# Patient Record
Sex: Female | Born: 1950 | Race: Black or African American | Hispanic: No | Marital: Single | Smoking: Never smoker
Health system: Southern US, Community
[De-identification: ages and names within clinical notes are randomized; demographics above are authoritative.]

## PROBLEM LIST (undated history)

## (undated) DIAGNOSIS — E785 Hyperlipidemia, unspecified: Secondary | ICD-10-CM

## (undated) DIAGNOSIS — A630 Anogenital (venereal) warts: Secondary | ICD-10-CM

## (undated) DIAGNOSIS — I10 Essential (primary) hypertension: Secondary | ICD-10-CM

## (undated) HISTORY — PX: OTHER SURGICAL HISTORY: SHX169

## (undated) HISTORY — DX: Essential (primary) hypertension: I10

## (undated) HISTORY — DX: Hyperlipidemia, unspecified: E78.5

## (undated) HISTORY — DX: Anogenital (venereal) warts: A63.0

---

## 2007-03-17 ENCOUNTER — Emergency Department (HOSPITAL_COMMUNITY): Admission: EM | Admit: 2007-03-17 | Discharge: 2007-03-17 | Payer: Self-pay | Admitting: Emergency Medicine

## 2007-08-02 ENCOUNTER — Ambulatory Visit: Payer: Self-pay | Admitting: Internal Medicine

## 2007-08-02 ENCOUNTER — Encounter (INDEPENDENT_AMBULATORY_CARE_PROVIDER_SITE_OTHER): Payer: Self-pay | Admitting: Nurse Practitioner

## 2007-08-02 LAB — CONVERTED CEMR LAB
AST: 13 units/L (ref 0–37)
Albumin: 4.5 g/dL (ref 3.5–5.2)
Alkaline Phosphatase: 93 units/L (ref 39–117)
BUN: 12 mg/dL (ref 6–23)
Basophils Absolute: 0 10*3/uL (ref 0.0–0.1)
Basophils Relative: 0 % (ref 0–1)
Eosinophils Absolute: 0.1 10*3/uL (ref 0.0–0.7)
MCHC: 34.1 g/dL (ref 30.0–36.0)
MCV: 95.5 fL (ref 78.0–100.0)
Monocytes Relative: 6 % (ref 3–11)
Neutrophils Relative %: 46 % (ref 43–77)
Platelets: 209 10*3/uL (ref 150–400)
Potassium: 4 meq/L (ref 3.5–5.3)
RDW: 13.5 % (ref 11.5–14.0)
Sodium: 142 meq/L (ref 135–145)

## 2007-08-28 ENCOUNTER — Encounter (INDEPENDENT_AMBULATORY_CARE_PROVIDER_SITE_OTHER): Payer: Self-pay | Admitting: Nurse Practitioner

## 2007-08-28 ENCOUNTER — Ambulatory Visit: Payer: Self-pay | Admitting: Family Medicine

## 2007-08-28 LAB — CONVERTED CEMR LAB
Cholesterol: 231 mg/dL — ABNORMAL HIGH (ref 0–200)
Total CHOL/HDL Ratio: 3.6
VLDL: 15 mg/dL (ref 0–40)

## 2008-11-11 ENCOUNTER — Emergency Department (HOSPITAL_COMMUNITY): Admission: EM | Admit: 2008-11-11 | Discharge: 2008-11-11 | Payer: Self-pay | Admitting: Emergency Medicine

## 2008-11-27 ENCOUNTER — Emergency Department (HOSPITAL_COMMUNITY): Admission: EM | Admit: 2008-11-27 | Discharge: 2008-11-27 | Payer: Self-pay | Admitting: Emergency Medicine

## 2008-12-09 ENCOUNTER — Ambulatory Visit (HOSPITAL_BASED_OUTPATIENT_CLINIC_OR_DEPARTMENT_OTHER): Admission: RE | Admit: 2008-12-09 | Discharge: 2008-12-09 | Payer: Self-pay | Admitting: Urology

## 2008-12-16 ENCOUNTER — Ambulatory Visit: Payer: Self-pay | Admitting: Internal Medicine

## 2008-12-16 ENCOUNTER — Encounter (INDEPENDENT_AMBULATORY_CARE_PROVIDER_SITE_OTHER): Payer: Self-pay | Admitting: Family Medicine

## 2008-12-16 LAB — CONVERTED CEMR LAB
Band Neutrophils: 0 % (ref 0–10)
Basophils Absolute: 0 10*3/uL (ref 0.0–0.1)
Basophils Relative: 1 % (ref 0–1)
Eosinophils Absolute: 0 10*3/uL (ref 0.0–0.7)
MCHC: 33.3 g/dL (ref 30.0–36.0)
Neutrophils Relative %: 44 % (ref 43–77)
RBC: 4.26 M/uL (ref 3.87–5.11)
RDW: 13.6 % (ref 11.5–15.5)

## 2008-12-30 ENCOUNTER — Ambulatory Visit: Payer: Self-pay | Admitting: Internal Medicine

## 2009-01-01 LAB — LACTATE DEHYDROGENASE: LDH: 139 U/L (ref 94–250)

## 2009-01-01 LAB — CBC WITH DIFFERENTIAL/PLATELET
Eosinophils Absolute: 0.1 10*3/uL (ref 0.0–0.5)
HCT: 35.9 % (ref 34.8–46.6)
LYMPH%: 48.5 % (ref 14.0–49.7)
MCV: 95.1 fL (ref 79.5–101.0)
MONO#: 0.2 10*3/uL (ref 0.1–0.9)
MONO%: 5.3 % (ref 0.0–14.0)
NEUT#: 1.3 10*3/uL — ABNORMAL LOW (ref 1.5–6.5)
NEUT%: 42.6 % (ref 38.4–76.8)
Platelets: 175 10*3/uL (ref 145–400)
RBC: 3.77 10*6/uL (ref 3.70–5.45)
WBC: 3.1 10*3/uL — ABNORMAL LOW (ref 3.9–10.3)

## 2009-01-01 LAB — COMPREHENSIVE METABOLIC PANEL
Alkaline Phosphatase: 72 U/L (ref 39–117)
BUN: 14 mg/dL (ref 6–23)
CO2: 26 mEq/L (ref 19–32)
Creatinine, Ser: 0.83 mg/dL (ref 0.40–1.20)
Glucose, Bld: 93 mg/dL (ref 70–99)
Sodium: 138 mEq/L (ref 135–145)
Total Bilirubin: 0.4 mg/dL (ref 0.3–1.2)

## 2009-01-16 ENCOUNTER — Ambulatory Visit: Payer: Self-pay | Admitting: Family Medicine

## 2009-01-16 ENCOUNTER — Encounter (INDEPENDENT_AMBULATORY_CARE_PROVIDER_SITE_OTHER): Payer: Self-pay | Admitting: Family Medicine

## 2009-01-16 ENCOUNTER — Other Ambulatory Visit: Admission: RE | Admit: 2009-01-16 | Discharge: 2009-01-16 | Payer: Self-pay | Admitting: Obstetrics & Gynecology

## 2009-02-13 LAB — CBC WITH DIFFERENTIAL/PLATELET
Basophils Absolute: 0 10*3/uL (ref 0.0–0.1)
EOS%: 2.8 % (ref 0.0–7.0)
Eosinophils Absolute: 0.1 10*3/uL (ref 0.0–0.5)
HCT: 35.5 % (ref 34.8–46.6)
HGB: 12.3 g/dL (ref 11.6–15.9)
MCH: 32.7 pg (ref 25.1–34.0)
MCV: 94.5 fL (ref 79.5–101.0)
MONO%: 4.9 % (ref 0.0–14.0)
NEUT#: 1.4 10*3/uL — ABNORMAL LOW (ref 1.5–6.5)
NEUT%: 44.9 % (ref 38.4–76.8)

## 2009-02-19 ENCOUNTER — Encounter: Admission: RE | Admit: 2009-02-19 | Discharge: 2009-02-19 | Payer: Self-pay | Admitting: Internal Medicine

## 2009-02-24 ENCOUNTER — Encounter: Admission: RE | Admit: 2009-02-24 | Discharge: 2009-02-24 | Payer: Self-pay | Admitting: General Surgery

## 2009-02-27 ENCOUNTER — Encounter (INDEPENDENT_AMBULATORY_CARE_PROVIDER_SITE_OTHER): Payer: Self-pay | Admitting: General Surgery

## 2009-02-27 ENCOUNTER — Ambulatory Visit (HOSPITAL_BASED_OUTPATIENT_CLINIC_OR_DEPARTMENT_OTHER): Admission: RE | Admit: 2009-02-27 | Discharge: 2009-02-27 | Payer: Self-pay | Admitting: General Surgery

## 2009-04-02 ENCOUNTER — Ambulatory Visit: Admission: RE | Admit: 2009-04-02 | Discharge: 2009-04-02 | Payer: Self-pay | Admitting: Gynecology

## 2009-04-03 ENCOUNTER — Ambulatory Visit: Payer: Self-pay | Admitting: Internal Medicine

## 2009-05-14 ENCOUNTER — Ambulatory Visit (HOSPITAL_COMMUNITY): Admission: RE | Admit: 2009-05-14 | Discharge: 2009-05-14 | Payer: Self-pay | Admitting: General Surgery

## 2009-07-04 ENCOUNTER — Ambulatory Visit: Payer: Self-pay | Admitting: Internal Medicine

## 2009-07-09 ENCOUNTER — Encounter (INDEPENDENT_AMBULATORY_CARE_PROVIDER_SITE_OTHER): Payer: Self-pay | Admitting: Internal Medicine

## 2009-07-09 ENCOUNTER — Ambulatory Visit: Payer: Self-pay | Admitting: Internal Medicine

## 2009-07-09 LAB — CONVERTED CEMR LAB
Anti Nuclear Antibody(ANA): NEGATIVE
Basophils Relative: 1 % (ref 0–1)
Eosinophils Absolute: 0.2 10*3/uL (ref 0.0–0.7)
MCHC: 30.9 g/dL (ref 30.0–36.0)
MCV: 92 fL (ref 78.0–100.0)
Neutrophils Relative %: 63 % (ref 43–77)
Platelets: 360 10*3/uL (ref 150–400)

## 2009-07-14 ENCOUNTER — Encounter (INDEPENDENT_AMBULATORY_CARE_PROVIDER_SITE_OTHER): Payer: Self-pay | Admitting: Internal Medicine

## 2009-07-14 LAB — CONVERTED CEMR LAB
Folate: 5.1 ng/mL
Saturation Ratios: 16 % — ABNORMAL LOW (ref 20–55)
TIBC: 247 ug/dL — ABNORMAL LOW (ref 250–470)
UIBC: 208 ug/dL

## 2009-09-02 ENCOUNTER — Encounter: Admission: RE | Admit: 2009-09-02 | Discharge: 2009-09-02 | Payer: Self-pay | Admitting: Obstetrics & Gynecology

## 2009-12-02 ENCOUNTER — Ambulatory Visit (HOSPITAL_COMMUNITY): Admission: RE | Admit: 2009-12-02 | Discharge: 2009-12-02 | Payer: Self-pay | Admitting: General Surgery

## 2010-02-02 IMAGING — CT CT PELVIS W/ CM
3 of 5 series · 13 of 36 positions shown, 19 images · IV contrast (READICAT/WATER & [ID] OMNI 300)
Comparison: 11/11/2008

CT ABDOMEN

CLINICAL DATA: Anal condyloma.  Question metastatic disease.

CT ABDOMEN AND PELVIS WITH CONTRAST
TECHNIQUE: Multidetector CT imaging of the abdomen and pelvis was
performed using the standard protocol following bolus
administration of intravenous contrast.
Contrast: 100 ml Umnipaque-S11

[Series 3: routine abdomen · axial · 0.70mm/px · z∈[-417,-112]mm · 7 of 80 slices shown, 12 images]
[im 10/80  soft-tissue]
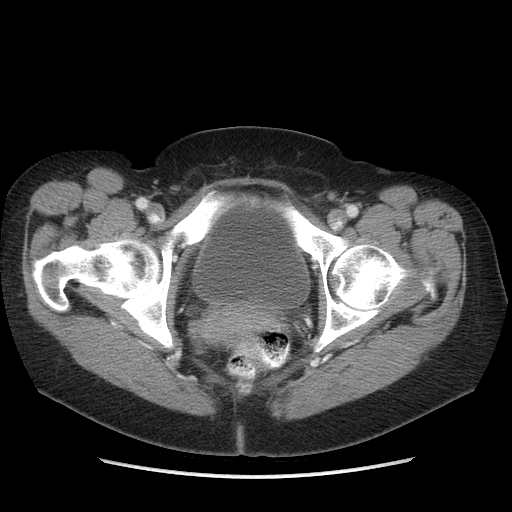
[im 10/80  bone]
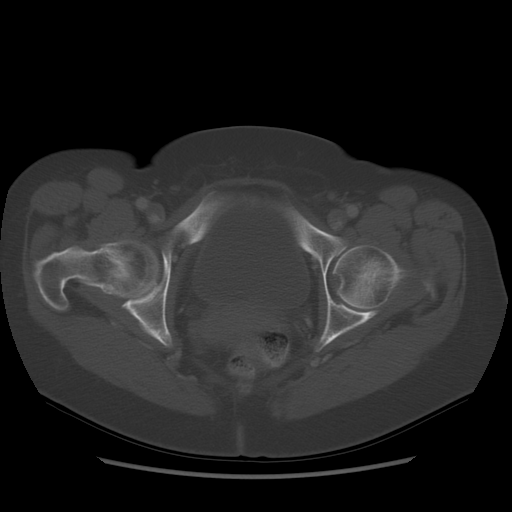
[im 20/80  soft-tissue]
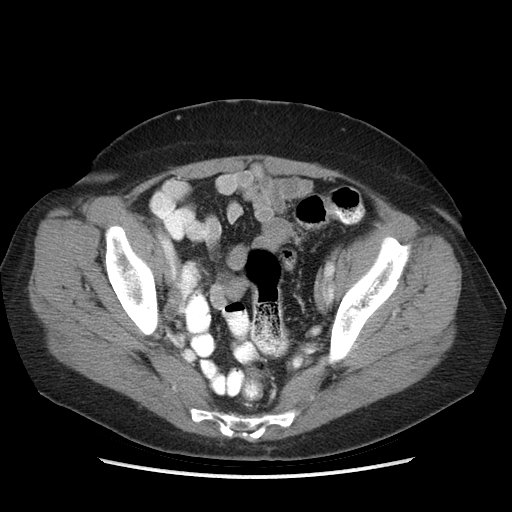
[im 30/80  soft-tissue]
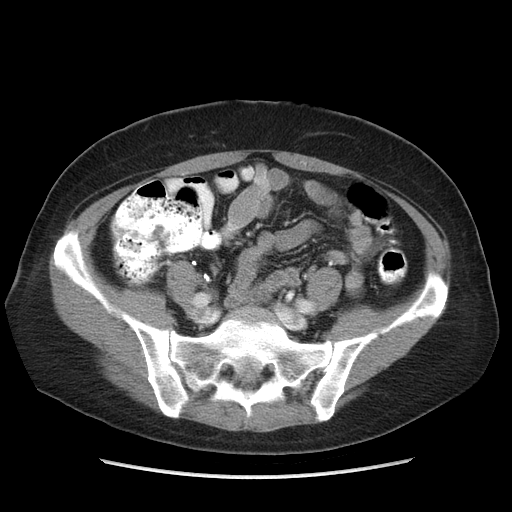
[im 40/80  soft-tissue]
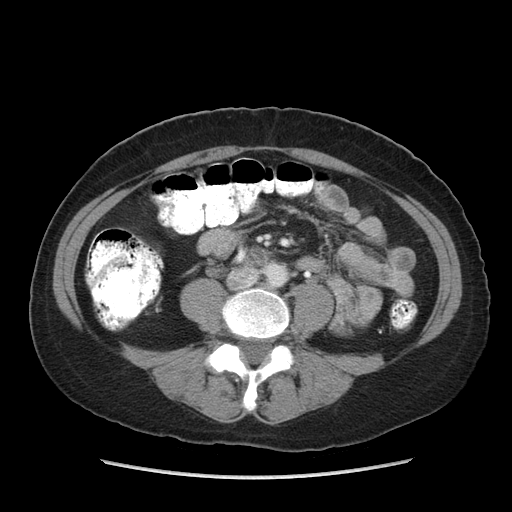
[im 40/80  lung]
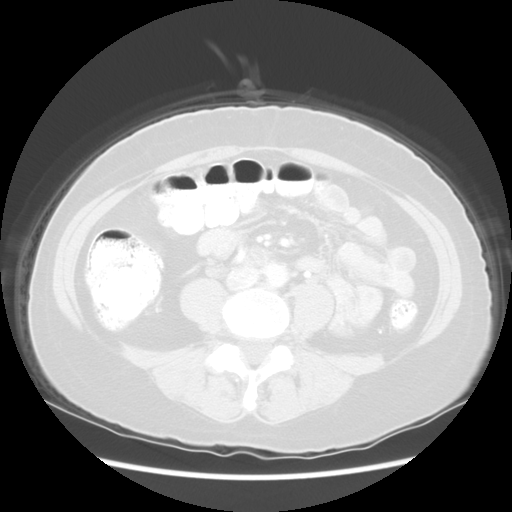
[im 50/80  soft-tissue]
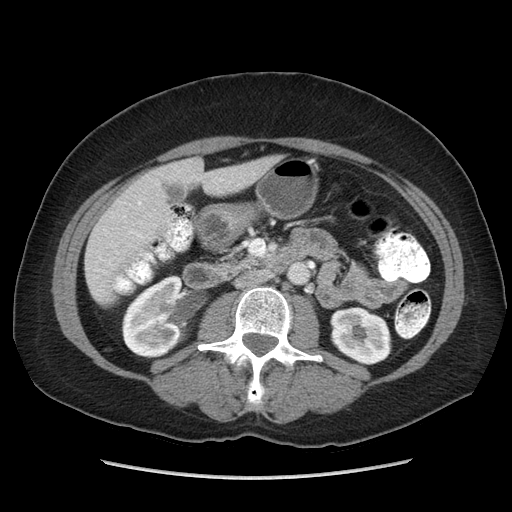
[im 50/80  lung]
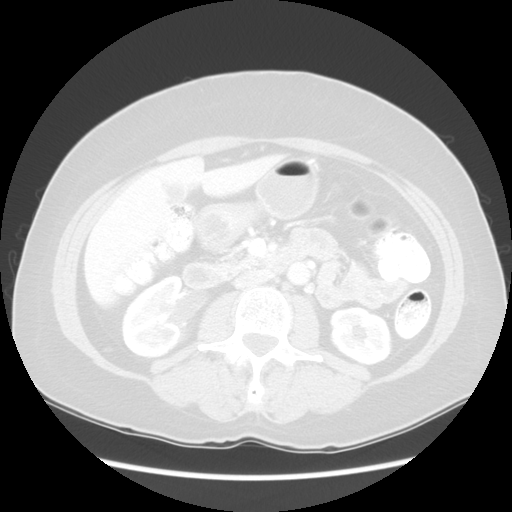
[im 60/80  soft-tissue]
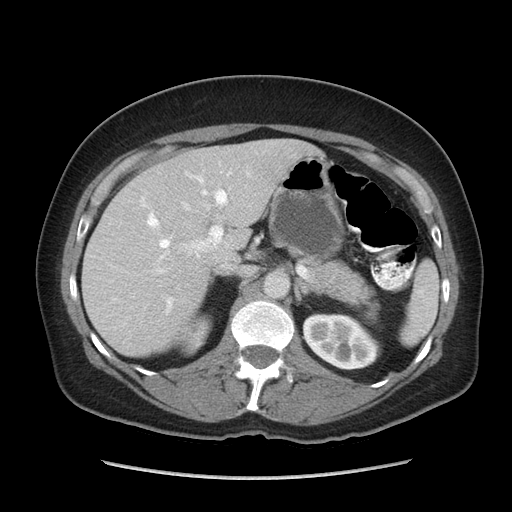
[im 60/80  lung]
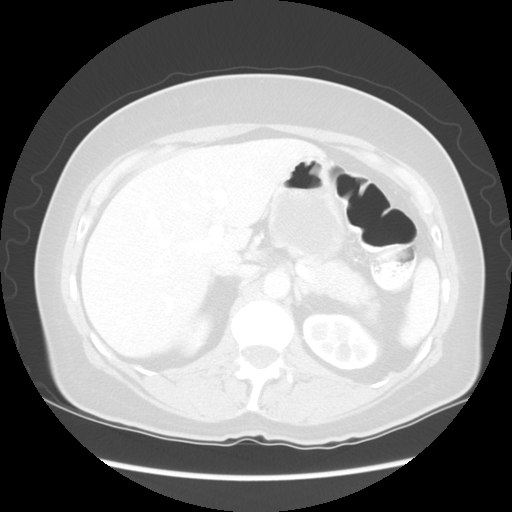
[im 70/80  soft-tissue]
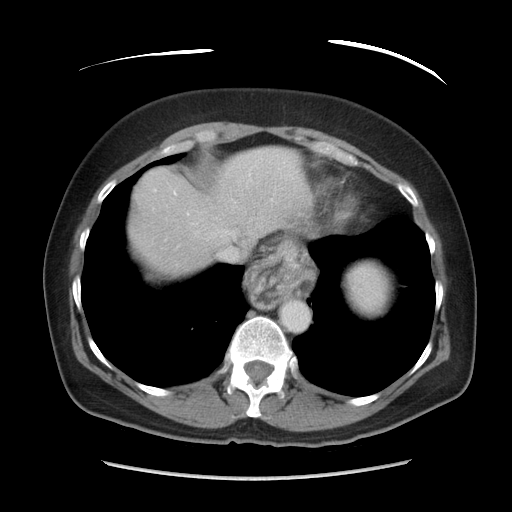
[im 70/80  lung]
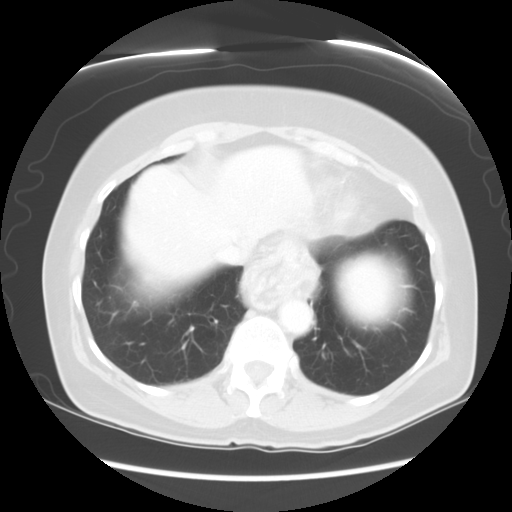

[Series 601: coronal body · coronal · 0.92mm/px · 1 of 114 slices shown, 2 images]
[im 38/114  soft-tissue]
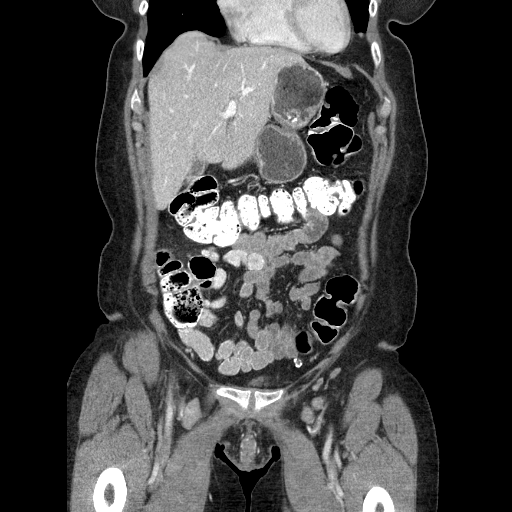
[im 38/114  bone]
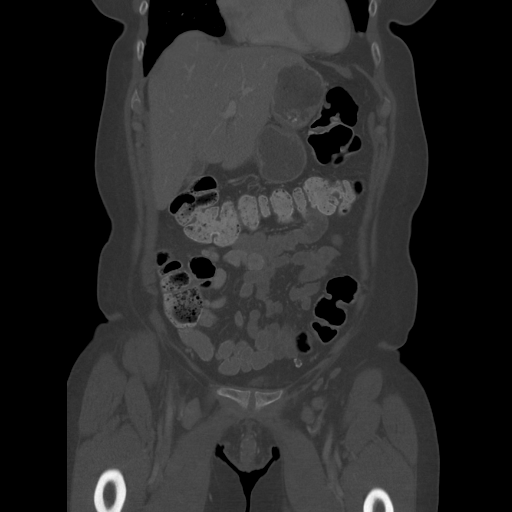

[Series 602: sagittal body · sagittal · 0.92mm/px · 5 of 144 slices shown]
[im 9/144  soft-tissue]
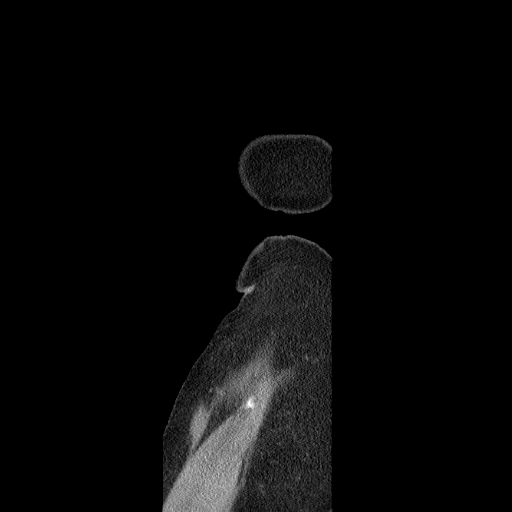
[im 27/144  soft-tissue]
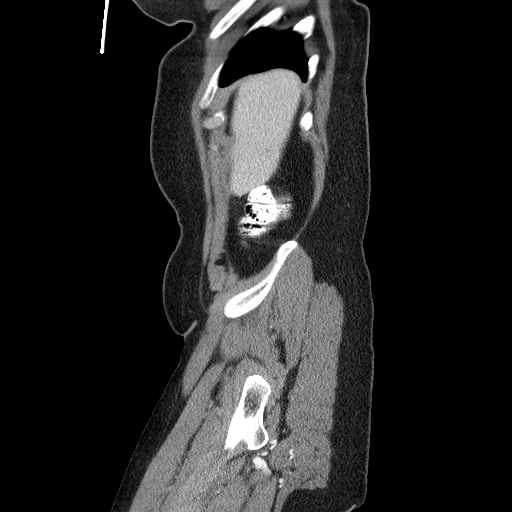
[im 45/144  soft-tissue]
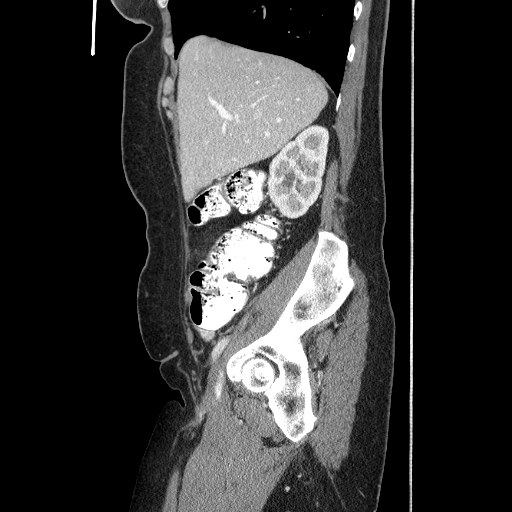
[im 63/144  soft-tissue]
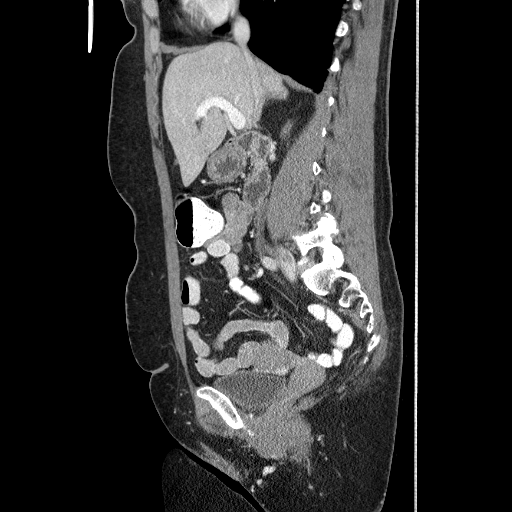
[im 81/144  soft-tissue]
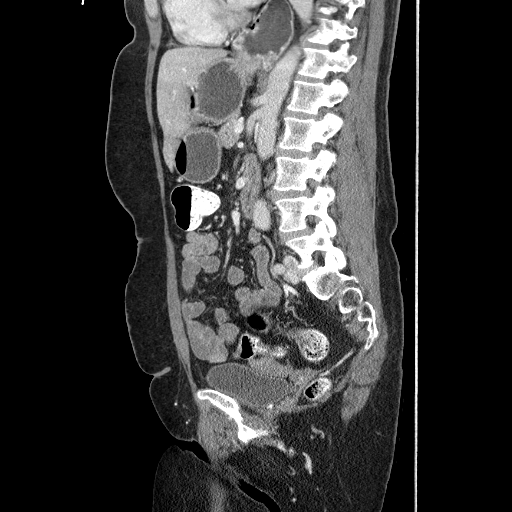

[13 of 36 positions shown; findings below may reference images not displayed]

FINDINGS: Lung bases show mild bibasilar scarring.  Heart size
normal.  No pericardial or pleural effusion.  Moderate hiatal
hernia.

Sub centimeter low attenuation lesion in the left hepatic lobe is
likely a small cyst or hemangioma, stable.  Liver, gallbladder and
adrenal glands are otherwise unremarkable.  Low attenuation lesions
in the kidneys measure up to 13 mm on the right.  Statistically,
these likely represent cysts.  Small stones, seen in the right
kidney on 11/11/2008, are not well visualized today after contrast
administration.  Kidneys, spleen, pancreas, stomach and small bowel
are otherwise unremarkable.  No pathologically enlarged lymph
nodes.  No free fluid.
IMPRESSION: 1.  No acute findings in the abdomen.
2.  Moderate hiatal hernia.

CT PELVIS
FINDINGS: Colon unremarkable.  There may be mild asymmetric soft
tissue along the right lateral aspect of the rectum, at the 7-8
o'clock position (image 87).  Bilateral inguinal lymph nodes
measure up to 10 mm in short axis.  A small calcified fibroid is
seen.  Ovaries are visualized.  No free fluid.  No worrisome lytic
or sclerotic lesions.
IMPRESSION: 1.  Question mild asymmetric soft tissue along the rectum, as
discussed above, in this patient with history of anal condyloma.
2.  Borderline bilateral inguinal lymph nodes.  If there is a
current or future diagnosis of anal cancer, these lymph node should
be followed on future exams.

## 2010-06-15 ENCOUNTER — Ambulatory Visit (HOSPITAL_COMMUNITY): Admission: RE | Admit: 2010-06-15 | Discharge: 2010-06-15 | Payer: Self-pay | Admitting: General Surgery

## 2010-11-08 ENCOUNTER — Encounter: Payer: Self-pay | Admitting: Obstetrics & Gynecology

## 2011-01-01 LAB — CBC
MCHC: 34.1 g/dL (ref 30.0–36.0)
MCV: 92.3 fL (ref 78.0–100.0)
Platelets: 169 10*3/uL (ref 150–400)
RDW: 13.6 % (ref 11.5–15.5)
WBC: 3.4 10*3/uL — ABNORMAL LOW (ref 4.0–10.5)

## 2011-01-06 LAB — CBC
MCV: 92.4 fL (ref 78.0–100.0)
Platelets: 162 10*3/uL (ref 150–400)
RDW: 15.3 % (ref 11.5–15.5)
WBC: 4 10*3/uL (ref 4.0–10.5)

## 2011-01-24 LAB — BASIC METABOLIC PANEL
CO2: 30 mEq/L (ref 19–32)
Chloride: 104 mEq/L (ref 96–112)
GFR calc Af Amer: >60 mL/min (ref >60)
Potassium: 4.3 mEq/L (ref 3.5–5.1)
Sodium: 142 mEq/L (ref 135–145)

## 2011-01-24 LAB — DIFFERENTIAL
Basophils Absolute: 0.1 10*3/uL (ref 0.0–0.1)
Lymphocytes Relative: 20 % (ref 12–46)
Lymphs Abs: 1.5 10*3/uL (ref 0.7–4.0)
Neutro Abs: 5.4 10*3/uL (ref 1.7–7.7)
Neutrophils Relative %: 72 % (ref 43–77)

## 2011-01-24 LAB — CBC
HCT: 33.6 % — ABNORMAL LOW (ref 36.0–46.0)
Platelets: 308 10*3/uL (ref 150–400)
RDW: 12.8 % (ref 11.5–15.5)
WBC: 7.5 10*3/uL (ref 4.0–10.5)

## 2011-01-26 LAB — DIFFERENTIAL
Basophils Absolute: 0 10*3/uL (ref 0.0–0.1)
Basophils Relative: 1 % (ref 0–1)
Eosinophils Relative: 2 % (ref 0–5)
Lymphocytes Relative: 53 % — ABNORMAL HIGH (ref 12–46)
Monocytes Absolute: 0.2 10*3/uL (ref 0.1–1.0)
Monocytes Relative: 6 % (ref 3–12)
Neutro Abs: 1.1 10*3/uL — ABNORMAL LOW (ref 1.7–7.7)

## 2011-01-26 LAB — CBC
HCT: 37.2 % (ref 36.0–46.0)
Hemoglobin: 12.9 g/dL (ref 12.0–15.0)
MCHC: 34.6 g/dL (ref 30.0–36.0)
RBC: 3.9 MIL/uL (ref 3.87–5.11)
RDW: 13.1 % (ref 11.5–15.5)

## 2011-01-26 LAB — POCT I-STAT, CHEM 8
Chloride: 106 mEq/L (ref 96–112)
Creatinine, Ser: 1.1 mg/dL (ref 0.4–1.2)
Glucose, Bld: 87 mg/dL (ref 70–99)
Hemoglobin: 12.9 g/dL (ref 12.0–15.0)
Potassium: 3.8 mEq/L (ref 3.5–5.1)
Sodium: 140 mEq/L (ref 135–145)

## 2011-01-26 LAB — APTT: aPTT: 32 seconds (ref 24–37)

## 2011-01-26 LAB — PROTIME-INR: INR: 1.1 (ref 0.00–1.49)

## 2011-02-01 LAB — URINALYSIS, ROUTINE W REFLEX MICROSCOPIC
Bilirubin Urine: NEGATIVE
Nitrite: NEGATIVE
Specific Gravity, Urine: 1.021 (ref 1.005–1.030)
Urobilinogen, UA: 0.2 mg/dL (ref 0.0–1.0)
pH: 7 (ref 5.0–8.0)

## 2011-02-01 LAB — URINE MICROSCOPIC-ADD ON

## 2011-02-02 LAB — URINALYSIS, ROUTINE W REFLEX MICROSCOPIC
Ketones, ur: 15 mg/dL — AB
Nitrite: NEGATIVE
Protein, ur: NEGATIVE mg/dL
Urobilinogen, UA: 0.2 mg/dL (ref 0.0–1.0)

## 2011-02-02 LAB — URINE MICROSCOPIC-ADD ON

## 2011-02-24 ENCOUNTER — Encounter (INDEPENDENT_AMBULATORY_CARE_PROVIDER_SITE_OTHER): Payer: Self-pay | Admitting: General Surgery

## 2011-03-02 NOTE — Group Therapy Note (Signed)
NAMEMarland Young  [name], ADCOX NO.:  [id number]   MEDICAL RECORD NO.:  [id number]          PATIENT TYPE:  WOC   LOCATION:  [facility]                   FACILITY:  [facility]   PHYSICIAN:  Elsie Lincoln, MD      DATE OF BIRTH:  [birth year]   DATE OF SERVICE:  01/16/2009                                  CLINIC NOTE   REASON FOR VISIT:  Evaluation of genital condyloma.   HISTORY OF PRESENT ILLNESS:  [name] is a 60 year old  gravida 3, para 3 who presents today for further evaluation of condyloma  in her genital region that has been present and worsening over the last  several years.  Most concern is condyloma around her anal region which  causes her some pain, however, it is fairly uncomfortable in terms of  hygiene and bowel movements.  She had been previously evaluated for this  at [facility] and she was referred to [facility] by  them for further evaluation and biopsy.   PAST MEDICAL HISTORY:  None.   PAST SURGICAL HISTORY:  None.   PAST OB HISTORY:  She had 3 pregnancies and 3 living children.  She has  a history of a cesarean section and a tubal ligation.  She denies any  history of abnormal Pap smear.   SOCIAL HISTORY:  She denies tobacco or alcohol use.   REVIEW OF SYSTEMS:  As per history of present illness.   PHYSICAL EXAMINATION:  VITAL SIGNS:  Normal.  GENERAL:  She is healthy-appearing in no acute distress.  GU:  On exam of the genital region, the labia are atrophic and both  labia majora show hyperkeratotic white lesions consistent with  condyloma.  RECTAL:  Around her anus and gluteus muscles, there is a remarkable very  large, erythematous condylomatous lesion that has eroded the medial  aspects of her buttocks as well as down into the anus region.  Because  of the extension in that area, it was recommended that she see General  Surgery for further evaluation of that.  However, it was recommended to  do a biopsy of the labial  lesion.   ASSESSMENT/PLAN:  Anogenital condylomata.   However given the extension of these lesions, she clearly needs to be  worked up further and evaluated for cancer.  I recommended a vulvar  biopsy today and discussed the risks of this procedure to include, but  not limited to bleeding, infection, need for further testing.  The  patient agreed with this and a signed consent is on the chart.   PROCEDURE:  She was anesthetized with 1% lidocaine.  The area was  prepped with Betadine and a small 3-mm punch biopsy was done on the left  vulvar region.  There was some marked bleeding which required 1 stitch  of a 4-0 Vicryl suture.  The patient was given a consult for General  Surgery.  Pending their evaluation, we will then discuss  what further evaluation and treatment needs to be done.  Additionally, a  Pap smear was done on the day of the visit and we will contact her with  these results.  The patient will follow up with [place] after her General  Surgery evaluation.     ______________________________  Odie Sera, D.O.    ______________________________  Elsie Lincoln, MD    MC/MEDQ  D:  02/13/2009  T:  02/13/2009  Job:  [id number]

## 2011-03-02 NOTE — Consult Note (Signed)
Hailey Young, Hailey Young             ACCOUNT NO.:  [id number]   MEDICAL RECORD NO.:  [id number]          PATIENT TYPE:  OUT   LOCATION:  GYN                          [facility]ACILITY[facility]  [facility]   PHYSICIAN:  De Blanch, M.D.DATE OF BIRTH:  [birth year]   DATE OF CONSULTATION:  04/02/2009  DATE OF DISCHARGE:                                 CONSULTATION   REFERRING PHYSICIAN:  Lesly Dukes, M.D.   CHIEF COMPLAINT:  Vulvar/anal condylomata.   HISTORY OF PRESENT ILLNESS:  60 year old African American female seen in  consultation at the request of Dr. Penne Lash regarding management of  extensive vulvar anal condylomata.  The patient has had condylomata for  a number of years and reports to me that she has had prior treatments  including laser therapy.  Her history is somewhat spotty and she is  unclear as to how long her current condylomata have been present.  As we  will see, they have obviously been present for a considerable amount of  time.  The patient has recently seen Dr. Donell Beers who took the patient to  the operating room on May 13 to perform an examination under anesthesia  and multiple biopsies.  The biopsies show this to be all condylomata  with associated moderate  to severe dysplasia in most areas.  No  evidence of invasive carcinoma was identified.   The patient has considerable amount of pruritus and pain in the anal  area in particular.   She reports she has had normal Pap smears recently and does not have  HIV.   PAST MEDICAL HISTORY:  Medical illnesses:  None.   PAST SURGICAL HISTORY:  Laser of the vulva.   OBSTETRICAL HISTORY:  Gravida 3.   SOCIAL HISTORY:  Patient does not smoke.   REVIEW OF SYSTEMS:  10 point comprehensive review of systems negative  except as noted above.   PHYSICAL EXAMINATION:  Weight 156 pounds, height 5 feet 6 inches.  Blood  pressure 110/72.  GENERAL:  The patient is a healthy African American female in no acute  distress.  HEENT:   Negative.  NECK:  Supple without thyromegaly.  There is no supraclavicular or  inguinal adenopathy.  ABDOMEN:  Soft, nontender.  No mass, organomegaly, ascites or hernias  are noted.  PELVIC EXAM:  EG BUS.  Vulva, vagina and anus are remarkable for a very  large condylomatous area occupying the entire perianal area from the  perineum down to the natal cleft and laterally out onto her thigh and  buttocks.  In aggregate this measures approximately 12 cm in all  directions and is circumferentially involving the anus.  In fact, the  anus is difficult to visualize in the clinic.  Her vagina is normal.  There are some small condylomata in the anterior vulva and periclitoral  area.  The cervix appears normal.  The uterus is anterior, normal shape,  size, consistency.  There are no adnexal masses noted.   IMPRESSION:  Extensive condylomata around the anus and lesser degree of  condylomata in the anterior vulva.  Interestingly, the labia majora are  relatively spared from condylomata.  With regard to the gynecologic  location of the anterior vulvar condylomata, I think the patient would  be best served with a CO2 laser ablation of this area.  We will work to  coordinate the surgical procedure with whatever surgical procedure Dr.  Donell Beers plans for the anal condylomata.      De Blanch, M.D.  Electronically Signed     DC/MEDQ  D:  04/02/2009  T:  04/02/2009  Job:  [id number]   cc:   Almond Lint, MD  [address]  [place] [address]   Lesly Dukes, M.D.   Telford Nab, R.N.  [address]  [place], [place] [address]

## 2011-03-02 NOTE — Op Note (Signed)
NAMEETHERINE, Hailey Young             ACCOUNT NO.:  [id number]   MEDICAL RECORD NO.:  [id number]          PATIENT TYPE:  AMB   LOCATION:  [facility]                         FACILITY:  [facility]   PHYSICIAN:  Sigmund I. Patsi Sears, M.D.DATE OF BIRTH:  26-Jul-1951   DATE OF PROCEDURE:  12/09/2008  DATE OF DISCHARGE:                               OPERATIVE REPORT   PREOPERATIVE DIAGNOSIS:  Right ureterovesical junction stone.   POSTOPERATIVE DIAGNOSIS:  Right ureterovesical junction stone.   OPERATION:  1. Cystourethroscopy.  2. Right retrograde pyelogram.  3. Laser fractionation of impacted right lower ureteral calculus.  4. Basket extraction of stone.   SURGEON:  Sigmund I. Patsi Sears, M.D.   ANESTHESIA:  General LMA.   PREPARATION:  After appropriate preanesthesia, the patient is brought to  the operating room, placed upon the operating table in the dorsal supine  position where general LMA anesthesia was introduced.  She was then  placed in the dorsal lithotomy position where the pubis was prepped with  Betadine solution and draped in the usual fashion.   REVIEW OF HISTORY:  This 60 year old female was referred from [facility]  emergency room, with right flank pain, nausea, vomiting, and right lower  quadrant pain.  CT scan showed a 5.5-mm right ureterovesical junction  stone with right hydronephrosis.  She has been unable to pass her stone  while on medical therapy, now presents for laser fractionation and  removal of stone.   PROCEDURE:  Inspection of the perineum reveals a giant condyloma  completely surrounding the vagina, to the rectum, and involving the top  of the vagina.  The urethra, however, is unaffected.  Cystoscopy reveals  a normal-appearing bladder with edematous right ureteral orifice.  Right  retrograde pyelogram reveals distal right ureteral calculus.  The  guidewire was passed around stone up to level of renal pelvis, and short  ureteroscope is passed into the ureter.   The stone is identified and  laser vaporization of the stone is accomplished.  The pieces of the  stone are basket extracted.  Because of the distal nature of the  calculus, because of the deficiency and atraumatic nature of the laser  refractionation and basket extraction, I elected not to place a double-J  stent.  A helical basket was used to remove the stone fragments.  The  patient was given IV Toradol, awakened and taken to the recovery room in  good condition.     Sigmund I. Patsi Sears, M.D.  Electronically Signed    SIT/MEDQ  D:  12/09/2008  T:  12/09/2008  Job:  [id number]

## 2011-03-02 NOTE — Op Note (Signed)
NAMECAROLLE, Hailey Young             ACCOUNT NO.:  [id number]   MEDICAL RECORD NO.:  [id number]          PATIENT TYPE:  AMB   LOCATION:  DAY                          FACILITY:  [facility]   PHYSICIAN:  John T. Kyla Balzarine, M.D.    DATE OF BIRTH:  1951/09/13   DATE OF PROCEDURE:  05/13/2009  DATE OF DISCHARGE:                               OPERATIVE REPORT   PREOPERATIVE DIAGNOSIS:  Multifocal perianal and vulvar condylomas.   PROCEDURE:  Extensive laser vaporization of vulvar condylomas (John T.  Kyla Balzarine, M.D., primary surgeon); extensive cautery of perianal condylomas  Almond Lint, M.D., primary surgeon).   ANESTHESIA:  General with local infiltration of 0.5% Marcaine with  epinephrine for vulvar laser vaporization.   DESCRIPTION OF FINDINGS AND INDICATIONS FOR SURGERY:  This 60 year old  woman presented with chronic condylomata involving the perianal tissues,  extending into the gluteal fold widely, with a posterior plaque  measuring in excess of 15 x 15 cm, extending essentially to the dentate  line of the anus and to the perineal body.  In the anterior vulva, there  were plaque-like condylomata measuring approximately 6 x 6 cm, involving  the anterior interlabial folds, labium minora, and clitoral hood.  Prior  examination under anesthesia with multiple biopsies had established  absence of invasive malignancy or high-grade dysplasia.  Extensive laser  vaporization of the anterior vulvar condylomas was performed with the  CO2 laser and electrocautery fulguration was performed of the extensive  perianal/gluteal condylomata.  Please refer to Dr. Arita Miss note for  details of electrocautery fulguration of the perianal lesions.   DESCRIPTION OF PROCEDURE:  Following Dr. Arita Miss electrosurgical  fulguration of the perianal lesions, the operative site was draped out  with saturated surgical towels.  Appropriate eye shielding and surgical  masks were employed.  The anterior vulvar lesions were  identified with  acetic acid staining and the subdermal tissues infiltrated with 10 mL of  0.5% Marcaine with epinephrine.  The CO2 laser was used with a handpiece  at a setting of 20 to 30 watts, continuous to ablate the vulvar  condylomas to the second surgical plane.  Hemostasis was excellent for  the vulvar and perianal lesions.  The surgical sites were prepped with  Silvadene and the patient was returned to the recovery room in stable  condition.   ESTIMATED BLOOD LOSS:  Nil.   Sponge and instrument counts correct.   TRANSFUSIONS:  None.   PATHOLOGY SPECIMENS:  None.      John T. Kyla Balzarine, M.D.  Electronically Signed     JTS/MEDQ  D:  05/14/2009  T:  05/15/2009  Job:  [id number]   cc:   Almond Lint, MD  [address]  [place] [address]   Telford Nab, R.N.  [address]  [place], [place] [address]

## 2011-03-02 NOTE — Op Note (Signed)
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   PROCEDURE PERFORMED:  Exam under anesthesia, left thigh biopsy, and  biopsies of the perianal and vulvar region.   SURGEON:  Almond Lint, MD   ASSISTANT:  None.   ANESTHESIA:  General and local.   FINDINGS:  Carpeted, fungating condyloma extending from the anal verge  outward, also carpeted lesions on the anterior vulva.   SPECIMENS:  Perianal biopsies 12 o'clock, 3 o'clock, 6 o'clock, 9  o'clock, and 7 o'clock.  Vulvar biopsies 12 o'clock, 8 o'clock, and 2  o'clock and left thigh nevus to pathology.   ESTIMATED BLOOD LOSS:  Minimal.   COMPLICATIONS:  None known.   PROCEDURE:  [name] was identified in the holding area and taken to  the operating room where she was placed supine on the operating room  table.  General anesthesia was induced.  The patient was placed into  lithotomy, and her perianal region and left thigh were prepped and  draped in a sterile fashion.  Time-out was performed according to the  surgical safety check list and all was correct and we continued.  Her  left thigh mole was anesthetized with a mixture 1% lidocaine plain and  0.25% Marcaine with epinephrine.  This was then done in an elliptical  fashion oriented vertically.  The skin was then closed using 3-0 Vicryl  and 4-0 Monocryl.  The attention was then directed to the perianal  region.  Digital exam was performed and it did not appear that there  were condyloma extending up into the anus and to the  rectum.  Anoscopy  demonstrated pretty clearly demarcation at the anal verge.  The rectal  area appeared normal.  Biopsies were taken in the most suspicious  locations and then some as representative samples of the carpeting,  fungating lesion.  There were several areas that were more firm and  these were taken with a punch biopsy and other fungating areas taken  with an Allis clamp and the scalpel.  The vulva also was involved,  however, these did not appear contiguous with the perianal condyloma.  There were several  representative samples taken of the vulvar lesions as well.  These were  closed with 3-0 chromic for bleeding.  The wound was noted to be  hemostatic and the area was washed and then dried and dressed with dry  gauze and ABDs and mesh underwear.  The patient was awakened from  anesthesia and taken to PACU in stable condition.      Almond Lint, MD  Electronically Signed     Almond Lint, MD  Electronically Signed  FB/MEDQ  D:  02/27/2009  T:  02/28/2009  Job:  [id number]

## 2011-05-07 ENCOUNTER — Encounter (INDEPENDENT_AMBULATORY_CARE_PROVIDER_SITE_OTHER): Payer: Self-pay | Admitting: General Surgery

## 2011-05-07 ENCOUNTER — Ambulatory Visit (INDEPENDENT_AMBULATORY_CARE_PROVIDER_SITE_OTHER): Payer: Medicaid Other | Admitting: General Surgery

## 2011-05-07 VITALS — Temp 97.2°F

## 2011-05-07 DIAGNOSIS — A630 Anogenital (venereal) warts: Secondary | ICD-10-CM | POA: Insufficient documentation

## 2011-05-07 DIAGNOSIS — A5139 Other secondary syphilis of skin: Secondary | ICD-10-CM

## 2011-05-07 MED ORDER — IMIQUIMOD 5 % EX CREA: TOPICAL_CREAM | CUTANEOUS | Status: AC

## 2011-05-07 NOTE — Progress Notes (Signed)
Hailey Young is a 60 y.o. female.    Chief Complaint  Patient presents with  . Follow-up    6 mo check anal condyloma    HPI HPI No pain.  Doing well.  Bowel movements are good.  Pt has not noted any new condylomata.  She denies other problems.  Past Medical History  Diagnosis Date  . Perianal condylomata     Past Surgical History  Procedure Date  . Ablation of perianal warts     History reviewed. No pertinent family history.  Social History History  Substance Use Topics  . Smoking status: Never Smoker   . Smokeless tobacco: Not on file  . Alcohol Use: No    No Known Allergies  Current Outpatient Prescriptions  Medication Sig Dispense Refill  . HYDROCODONE-ACETAMINOPHEN PO Take by mouth.        . imiquimod (ALDARA) 5 % cream Apply to affected area three times weekly  12 each  1  . OXYCODONE HCL PO Take by mouth.        . Oxycodone-Acetaminophen (PERCOCET PO) Take by mouth.          Review of Systems ROS Otherwise negative times 11 Physical Exam Physical Exam  Constitutional: She is oriented to person, place, and time. She appears well-developed and well-nourished. No distress.  HENT:  Head: Normocephalic and atraumatic.  Mouth/Throat: No oropharyngeal exudate.  Eyes: Conjunctivae are normal. Pupils are equal, round, and reactive to light. No scleral icterus.  Neck: Normal range of motion. Neck supple. No tracheal deviation present. No thyromegaly present.  Cardiovascular: Normal rate.   Respiratory: Effort normal. No respiratory distress. She exhibits no tenderness.  GI: Soft. Bowel sounds are normal. She exhibits no distension. There is no tenderness. There is no rebound and no guarding.  Genitourinary:          Very small area of recurrent condylomata  Musculoskeletal: Normal range of motion. She exhibits no edema and no tenderness.  Lymphadenopathy:    She has no cervical adenopathy.  Neurological: She is alert and oriented to person, place, and  time.  Skin: Skin is warm and dry. No rash noted. She is not diaphoretic. No erythema.  Psychiatric: She has a normal mood and affect. Her behavior is normal. Judgment and thought content normal.   Temperature 97.2 F (36.2 C), temperature source Temporal.  Assessment/Plan Perianal condylomata Aldara cream. Follow up in 3 months  Zaid Tomes 05/07/2011, 12:11 PM

## 2011-05-14 ENCOUNTER — Encounter (INDEPENDENT_AMBULATORY_CARE_PROVIDER_SITE_OTHER): Payer: Self-pay | Admitting: General Surgery

## 2011-05-26 ENCOUNTER — Inpatient Hospital Stay (INDEPENDENT_AMBULATORY_CARE_PROVIDER_SITE_OTHER)
Admission: RE | Admit: 2011-05-26 | Discharge: 2011-05-26 | Disposition: A | Discharge status: Home / Self Care | Payer: Medicaid Other | Source: Ambulatory Visit | Attending: Emergency Medicine | Admitting: Emergency Medicine

## 2011-05-26 ENCOUNTER — Ambulatory Visit (INDEPENDENT_AMBULATORY_CARE_PROVIDER_SITE_OTHER): Payer: Medicaid Other

## 2011-05-26 DIAGNOSIS — M1990 Unspecified osteoarthritis, unspecified site: Secondary | ICD-10-CM

## 2011-08-19 ENCOUNTER — Encounter: Payer: Medicaid Other | Attending: Family Medicine | Admitting: *Deleted

## 2011-08-19 ENCOUNTER — Ambulatory Visit: Payer: Medicaid Other | Admitting: *Deleted

## 2011-08-19 ENCOUNTER — Encounter: Payer: Self-pay | Admitting: *Deleted

## 2011-08-19 DIAGNOSIS — Z713 Dietary counseling and surveillance: Secondary | ICD-10-CM | POA: Insufficient documentation

## 2011-08-19 NOTE — Progress Notes (Signed)
  Medical Nutrition Therapy:  Appt start time: 1100 end time:  1200.   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 She is here with her daughter who appears fairly supportive. She moved from her home to an apartment about 2 years ago which has affected her daily activities significantly. She used to work in her yard several hours a day and now she does not have a yard to work in. She also tends to eat quite a lot of candy throughout the day and the night which has increased her calorie intake significantly.  MEDICATIONS: see list   DIETARY INTAKE:  Usual eating pattern includes 2-3 meals and multiple snacks per day.  Everyday foods include good variety of all food groups and excess sweet intake.    24-hr recall:  B ( AM): skkps half the week, may have grits or hashbrowns with bacon   Snk ( AM): candy throughout the day  L ( PM): skips the other half of the week, may have pre-made salad with meat added to it, or lean meat sandwich Snk ( PM): candy D ( PM): she prepares dinner for herself and her son, meat, starch and vegetables on a small plate Snk ( PM): candy or chips    Usual physical activity: has not been active due to knee pain and limited access to gardening at her apartment complex.  Estimated energy needs: 1500 calories 170 g carbohydrates 112 g protein 42 g fat  Progress Towards Goal(s):  In progress.   Nutritional Diagnosis:  NI-1.5 Excessive energy intake As related to excess candy intake and less active lifestyle. As evidenced by abnormal weight gain over the past 2 years.  Intervention:  Nutrition counseling provided recommending ways to increase her awareness of the excess portion sizes of candy and other foods. Recommend she put 10 pieces of candy in bowl in her bedroom instead of whole container and another 10 pieces for daytime. This is significant reduction to her current candy intake. Also  suggest that no foods be eaten out of the container they come in, but all food be placed in a dish or paper towel so portions can be controlled better. Finally, I've suggested she consider ways to increase her activity level either in her own neighborhood or nearby schools as she spends most of her day in her apartment with nothing to do.  Monitoring/Evaluation:  Dietary intake reduced by limiting candy to specific portion sizes, exercise, consider Mall Walking with her daughter or videos of Arm Chair Exercises and/or Yoga, and body weight prn.

## 2015-07-04 ENCOUNTER — Observation Stay (HOSPITAL_COMMUNITY)
Admission: EM | Admit: 2015-07-04 | Discharge: 2015-07-05 | Disposition: A | Discharge status: Home / Self Care | Payer: Medicaid Other | Attending: Internal Medicine | Admitting: Internal Medicine

## 2015-07-04 ENCOUNTER — Encounter (HOSPITAL_COMMUNITY): Payer: Self-pay | Admitting: Emergency Medicine

## 2015-07-04 DIAGNOSIS — Z6831 Body mass index (BMI) 31.0-31.9, adult: Secondary | ICD-10-CM | POA: Insufficient documentation

## 2015-07-04 DIAGNOSIS — E669 Obesity, unspecified: Secondary | ICD-10-CM | POA: Diagnosis not present

## 2015-07-04 DIAGNOSIS — Z7982 Long term (current) use of aspirin: Secondary | ICD-10-CM | POA: Insufficient documentation

## 2015-07-04 DIAGNOSIS — D509 Iron deficiency anemia, unspecified: Secondary | ICD-10-CM | POA: Diagnosis not present

## 2015-07-04 DIAGNOSIS — F508 Other eating disorders: Secondary | ICD-10-CM | POA: Diagnosis present

## 2015-07-04 DIAGNOSIS — E876 Hypokalemia: Secondary | ICD-10-CM | POA: Diagnosis not present

## 2015-07-04 DIAGNOSIS — D649 Anemia, unspecified: Secondary | ICD-10-CM | POA: Diagnosis not present

## 2015-07-04 DIAGNOSIS — F5089 Other specified eating disorder: Secondary | ICD-10-CM

## 2015-07-04 DIAGNOSIS — G8929 Other chronic pain: Secondary | ICD-10-CM | POA: Diagnosis not present

## 2015-07-04 DIAGNOSIS — D72819 Decreased white blood cell count, unspecified: Secondary | ICD-10-CM | POA: Diagnosis not present

## 2015-07-04 LAB — CBC WITH DIFFERENTIAL/PLATELET
BASOS ABS: 0 10*3/uL (ref 0.0–0.1)
Basophils Relative: 1 %
EOS PCT: 3 %
Eosinophils Absolute: 0.1 10*3/uL (ref 0.0–0.7)
HEMATOCRIT: 18.2 % — AB (ref 36.0–46.0)
Hemoglobin: 5 g/dL — CL (ref 12.0–15.0)
LYMPHS ABS: 1.2 10*3/uL (ref 0.7–4.0)
LYMPHS PCT: 37 %
MCH: 21.5 pg — AB (ref 26.0–34.0)
MCHC: 27.5 g/dL — ABNORMAL LOW (ref 30.0–36.0)
MCV: 78.1 fL (ref 78.0–100.0)
MONO ABS: 0.3 10*3/uL (ref 0.1–1.0)
MONOS PCT: 9 %
NEUTROS ABS: 1.7 10*3/uL (ref 1.7–7.7)
Neutrophils Relative %: 50 %
Platelets: 347 10*3/uL (ref 150–400)
RBC: 2.33 MIL/uL — ABNORMAL LOW (ref 3.87–5.11)
RDW: 19.5 % — AB (ref 11.5–15.5)
WBC: 3.3 10*3/uL — ABNORMAL LOW (ref 4.0–10.5)

## 2015-07-04 LAB — PROTIME-INR
INR: 1.22 (ref 0.00–1.49)
PROTHROMBIN TIME: 15.6 s — AB (ref 11.6–15.2)

## 2015-07-04 LAB — COMPREHENSIVE METABOLIC PANEL
ALT: 14 U/L (ref 14–54)
AST: 20 U/L (ref 15–41)
Albumin: 3.8 g/dL (ref 3.5–5.0)
Alkaline Phosphatase: 91 U/L (ref 38–126)
Anion gap: 7 (ref 5–15)
BILIRUBIN TOTAL: 0.4 mg/dL (ref 0.3–1.2)
BUN: 11 mg/dL (ref 6–20)
CO2: 25 mmol/L (ref 22–32)
CREATININE: 0.84 mg/dL (ref 0.44–1.00)
Calcium: 9.7 mg/dL (ref 8.9–10.3)
Chloride: 107 mmol/L (ref 101–111)
Glucose, Bld: 85 mg/dL (ref 65–99)
POTASSIUM: 3.4 mmol/L — AB (ref 3.5–5.1)
Sodium: 139 mmol/L (ref 135–145)
TOTAL PROTEIN: 7.5 g/dL (ref 6.5–8.1)

## 2015-07-04 LAB — IRON AND TIBC
Iron: 11 ug/dL — ABNORMAL LOW (ref 28–170)
SATURATION RATIOS: 2 % — AB (ref 10.4–31.8)
TIBC: 505 ug/dL — ABNORMAL HIGH (ref 250–450)
UIBC: 494 ug/dL

## 2015-07-04 LAB — FERRITIN: Ferritin: 2 ng/mL — ABNORMAL LOW (ref 11–307)

## 2015-07-04 LAB — I-STAT TROPONIN, ED: Troponin i, poc: 0 ng/mL (ref 0.00–0.08)

## 2015-07-04 LAB — POC OCCULT BLOOD, ED: Fecal Occult Bld: NEGATIVE

## 2015-07-04 LAB — RETICULOCYTES
RBC.: 2.29 MIL/uL — AB (ref 3.87–5.11)
Retic Count, Absolute: 41.2 10*3/uL (ref 19.0–186.0)
Retic Ct Pct: 1.8 % (ref 0.4–3.1)

## 2015-07-04 LAB — PREPARE RBC (CROSSMATCH)

## 2015-07-04 LAB — ABO/RH: ABO/RH(D): A POS

## 2015-07-04 LAB — VITAMIN B12: Vitamin B-12: 345 pg/mL (ref 180–914)

## 2015-07-04 LAB — FOLATE: FOLATE: 18.1 ng/mL (ref >5.9)

## 2015-07-04 MED ORDER — POTASSIUM CHLORIDE CRYS ER 20 MEQ PO TBCR
40.0000 meq | EXTENDED_RELEASE_TABLET | Freq: Once | ORAL | Status: AC
  Administered 2015-07-04: 40 meq via ORAL
  Filled 2015-07-04: qty 2

## 2015-07-04 MED ORDER — ACETAMINOPHEN 325 MG PO TABS
650.0000 mg | ORAL_TABLET | Freq: Four times a day (QID) | ORAL | Status: DC | PRN
  Administered 2015-07-05: 650 mg via ORAL
  Filled 2015-07-04: qty 2

## 2015-07-04 MED ORDER — ONDANSETRON HCL 4 MG/2ML IJ SOLN
4.0000 mg | Freq: Four times a day (QID) | INTRAMUSCULAR | Status: DC | PRN
Start: 2015-07-04 — End: 2015-07-05

## 2015-07-04 MED ORDER — PANTOPRAZOLE SODIUM 40 MG PO TBEC
40.0000 mg | DELAYED_RELEASE_TABLET | Freq: Every day | ORAL | Status: DC
  Administered 2015-07-04 – 2015-07-05 (×2): 40 mg via ORAL
  Filled 2015-07-04 (×2): qty 1

## 2015-07-04 MED ORDER — SODIUM CHLORIDE 0.9 % IJ SOLN
3.0000 mL | Freq: Two times a day (BID) | INTRAMUSCULAR | Status: DC
Start: 2015-07-04 — End: 2015-07-05
  Administered 2015-07-04 – 2015-07-05 (×2): 3 mL via INTRAVENOUS

## 2015-07-04 MED ORDER — ACETAMINOPHEN 650 MG RE SUPP: 650.0000 mg | Freq: Four times a day (QID) | RECTAL | Status: DC | PRN

## 2015-07-04 MED ORDER — ONDANSETRON HCL 4 MG PO TABS: 4.0000 mg | ORAL_TABLET | Freq: Four times a day (QID) | ORAL | Status: DC | PRN

## 2015-07-04 NOTE — ED Notes (Signed)
Internal MD at the bedside.

## 2015-07-04 NOTE — ED Notes (Signed)
Pt sent here by doctor for hemoglobin of 5. Pt reports fatigue and sob. Pt sts she has been craving starch.

## 2015-07-04 NOTE — ED Provider Notes (Signed)
CSN: [id number]     Arrival date & time 07/04/15  1458 History   First MD Initiated Contact with Patient 07/04/15 1511     Chief Complaint  Patient presents with  . Abnormal Lab     (Consider location/radiation/quality/duration/timing/severity/associated sxs/prior Treatment) HPI Patient reports that she went to see her family doctor today for knee pain. She states that intermittently for quite some time she gets pain in her knees. Sometimes this will particularly at night. The patient ports of the checked her blood count for no symptoms that she reported. She states she was contacted today to come to the hospital due to low hemoglobin. Although the patient endorses fatigue and shortness of breath in the triage note. She does not endorse significant symptoms for me. She denies lightheadedness with position change. She denies chest pain or exertional dyspnea. She reports she has been craving start. She will get a regular size boxes start any over the course of 2 days. She denies any blood loss that she is aware of. She denies her stool is black or dark or bloody. He denies vomiting. She does not experience any vaginal bleeding. She didn't think she was sick in any way. She does however report she takes [place] powders probably at least once daily for knee pain. Past Medical History  Diagnosis Date  . Perianal condylomata    Past Surgical History  Procedure Laterality Date  . Ablation of perianal warts     No family history on file. Social History  Substance Use Topics  . Smoking status: Never Smoker   . Smokeless tobacco: Never Used  . Alcohol Use: No   OB History    No data available     Review of Systems 10 Systems reviewed and are negative for acute change except as noted in the HPI.    Allergies  Review of patient's allergies indicates no known allergies.  Home Medications   Prior to Admission medications   Medication Sig Start Date End Date Taking? Authorizing Provider   Aspirin-Acetaminophen-Caffeine (GOODY HEADACHE PO) Take 1 Package by mouth daily as needed (for pain).   Yes Historical Provider, MD   BP 124/85 mmHg  Pulse 87  Temp(Src) 98.4 F (36.9 C) (Oral)  Resp 20  Ht  (1.676 m)  Wt 196 lb (88.905 kg)  BMI 31.65 kg/m2  SpO2 100% Physical Exam  Constitutional: She is oriented to person, place, and time. She appears well-developed and well-nourished.  Patient is moderately obese. She is alert and nontoxic. She has well appearance. No respiratory distress.  HENT:  Head: Normocephalic and atraumatic.  Mouth/Throat: Oropharynx is clear and moist.  Tongue is slightly smooth and pale. Posterior oropharynx widely patent.  Eyes: EOM are normal. Pupils are equal, round, and reactive to light.  Neck: Neck supple.  Cardiovascular: Regular rhythm, normal heart sounds and intact distal pulses.   Patient's tachycardic.  Pulmonary/Chest: Effort normal and breath sounds normal.  Abdominal: Soft. Bowel sounds are normal. She exhibits no distension. There is no tenderness.  Genitourinary:  Rectal examination there is small amount of formed stool in the vault. It is brown in appearance no melon the present.  Musculoskeletal: Normal range of motion. She exhibits no edema or tenderness.  Patient does not have any peripheral edema. She has no calf tenderness. Her knees shows some enlargement consistent with degenerative joint disease. She does not have any effusion or erythema of the knees. No significant tenderness at this time.  Neurological: She is alert  and oriented to person, place, and time. She has normal strength. No cranial nerve deficit. She exhibits normal muscle tone. Coordination normal. GCS eye subscore is 4. GCS verbal subscore is 5. GCS motor subscore is 6.  Skin: Skin is warm, dry and intact. There is pallor.  Psychiatric: She has a normal mood and affect.    ED Course  Procedures (including critical care time) Labs Review Labs Reviewed   CBC WITH DIFFERENTIAL/PLATELET - Abnormal; Notable for the following:    WBC 3.3 (*)    RBC 2.33 (*)    Hemoglobin 5.0 (*)    HCT 18.2 (*)    MCH 21.5 (*)    MCHC 27.5 (*)    RDW 19.5 (*)    All other components within normal limits  COMPREHENSIVE METABOLIC PANEL - Abnormal; Notable for the following:    Potassium 3.4 (*)    All other components within normal limits  PROTIME-INR - Abnormal; Notable for the following:    Prothrombin Time 15.6 (*)    All other components within normal limits  IRON AND TIBC - Abnormal; Notable for the following:    Iron 11 (*)    TIBC 505 (*)    Saturation Ratios 2 (*)    All other components within normal limits  FERRITIN - Abnormal; Notable for the following:    Ferritin 2 (*)    All other components within normal limits  RETICULOCYTES - Abnormal; Notable for the following:    RBC. 2.29 (*)    All other components within normal limits  VITAMIN B12  FOLATE  URINALYSIS, ROUTINE W REFLEX MICROSCOPIC (NOT AT [facility])  I-STAT TROPOININ, ED  POC OCCULT BLOOD, ED  TYPE AND SCREEN  ABO/RH  PREPARE RBC (CROSSMATCH)    Imaging Review No results found. I have personally reviewed and evaluated these images and lab results as part of my medical decision-making.   EKG Interpretation   Date/Time:  Friday July 04 2015 15:06:25 EDT Ventricular Rate:  108 PR Interval:  142 QRS Duration: 80 QT Interval:  334 QTC Calculation: 447 R Axis:   49 Text Interpretation:  Sinus tachycardia Otherwise normal ECG Confirmed by  Donnald Garre, MD, Lebron Conners [phone]) on 07/04/2015 3:38:32 PM      MDM   Final diagnoses:  Anemia, unspecified anemia type  [place]   Patient presents with severe anemia with a hemoglobin at 5. At this time there does not appear to be any GI blood loss.    Arby Barrette, MD 07/05/15 2245

## 2015-07-04 NOTE — ED Notes (Signed)
Report attempted 

## 2015-07-04 NOTE — ED Notes (Signed)
Reg. Food Tray has been ordered [phone]

## 2015-07-04 NOTE — ED Notes (Addendum)
Report attempted twice by day shift and night shift.

## 2015-07-04 NOTE — H&P (Signed)
[facility] History and Physical  PAMMIE CHIRINO NFA:[id number] DOB: Nov 13, 1950 DOA: 07/04/2015  PCP: [facility]  Chief Complaint: Abnormal labs  HPI: Hailey Young is a 64 y.o. woman who denies any significant past medical history except pain in multiple joints (which she attributes to "probable arthritis") who has had several months of progressive fatigue, weakness, and shortness of breath.  She actually presented to a primary care provider for evaluation of knee pain.  She was referred for general labs, and was ultimately notified that her hemoglobin level was low and that she needed a blood transfusion.  She denies any history of gross blood loss.  She is post-menopausal and has not experienced any abnormal vaginal bleeding.  She denies any blood in her urine or stool.  Of note, she takes Goody powders 3-4 times per week for joint pain, and has done so for several months.  No known history of ulcers.  She takes an OTC antacid as needed for indigestion.  She denies chest pain.  No nausea, vomiting, or diarrhea.  No swelling.  She has never had a colonoscopy.  Review of Systems: 12 systems reviewed and negative except as stated in HPI.  Past Medical History  Diagnosis Date  . Perianal condylomata    Past Surgical History  Procedure Laterality Date  . Ablation of perianal warts     Social History:  Social History   Social History Narrative  No tobacco, EtOH, or illicit drug use.  She is married.  She has three children.  She is retired.  No Known Allergies  Family History: One sister died of complications related to cancer; type unknown.  Otherwise, she denies family history of heart disease, strokes, high blood pressure, diabetes, or thyroid disease.  Prior to Admission medications   Medication Sig Start Date End Date Taking? Authorizing Provider  Aspirin-Acetaminophen-Caffeine (GOODY HEADACHE PO) Take 1 Package by mouth daily as needed (for pain).   Yes  Historical Provider, MD   Physical Exam: Filed Vitals:   07/04/15 1930 07/04/15 1945 07/04/15 2000 07/04/15 2046  BP: 117/74 122/64 118/66 137/75  Pulse: 80 85 86 111  Temp:    98.2 F (36.8 C)  TempSrc:    Oral  Resp: Height:     (1.676 m)  Weight:    88.996 kg (196 lb 3.2 oz)  SpO2: 100% 100% 100% 100%     General:  Awake and alert, NAD.  Eyes: PERRL, EOMI, conjunctiva are pale  ENT: Mucous membranes slightly dry, no nasal drainage  Neck: Supple, no carotid bruit  Cardiovascular: NR/RR, no murmur  Respiratory: CTA bilaterally  Abdomen: soft and nontender, bowel sounds present, no guarding  Skin: pale  Musculoskeletal: Moves all four extremities spontaneously  Psychiatric: Appropriate affect and mood  Neurologic: No focal deficits  Labs on Admission:  Basic Metabolic Panel:  Recent Labs Lab 07/04/15 1545  NA 139  K 3.4*  CL 107  CO2 25  GLUCOSE 85  BUN 11  CREATININE 0.84  CALCIUM 9.7   Liver Function Tests:  Recent Labs Lab 07/04/15 1545  AST 20  ALT 14  ALKPHOS 91  BILITOT 0.4  PROT 7.5  ALBUMIN 3.8   CBC:  Recent Labs Lab 07/04/15 1545  WBC 3.3*  NEUTROABS 1.7  HGB 5.0*  HCT 18.2*  MCV 78.1  PLT 347   EKG: Independently reviewed. Mild sinus tachycardia.  No acute ST segment changes.  Assessment/Plan Principal Problem:  Symptomatic anemia Active Problems:   Hypokalemia  Mild leukopenia, etiology unknown  1. Admit for observation, telemetry  2.  Symptomatic anemia --Two units of PRBCs ordered in the ED --Low iron and low ferritin with elevated TIBC suggest iron deficiency.  Will need referral for EGD/colonoscopy (inpatient vs outpatient to be discussed) --Protonix 40mg  po once daily since this does not appear to be acute, active blood loss --Thyroid function tests pending  3.  Hypokalemia --KCl PO one time ordered, repeat BMP in AM  4.  Leukopenia noted --Repeat CBC in AM, may ultimately  need hematology involvement  Code Status: FULL  Family Communication: Two children at bedside  Disposition Plan: To be determined  Time spent: 60 minutes  [employer] [facility]  07/04/2015, 9:25 PM

## 2015-07-05 ENCOUNTER — Encounter (HOSPITAL_COMMUNITY): Payer: Self-pay | Admitting: *Deleted

## 2015-07-05 DIAGNOSIS — E876 Hypokalemia: Secondary | ICD-10-CM | POA: Diagnosis not present

## 2015-07-05 DIAGNOSIS — D509 Iron deficiency anemia, unspecified: Secondary | ICD-10-CM | POA: Diagnosis not present

## 2015-07-05 DIAGNOSIS — E669 Obesity, unspecified: Secondary | ICD-10-CM | POA: Diagnosis not present

## 2015-07-05 DIAGNOSIS — D649 Anemia, unspecified: Secondary | ICD-10-CM

## 2015-07-05 LAB — URINALYSIS, ROUTINE W REFLEX MICROSCOPIC
Bilirubin Urine: NEGATIVE
GLUCOSE, UA: NEGATIVE mg/dL
Hgb urine dipstick: NEGATIVE
KETONES UR: NEGATIVE mg/dL
NITRITE: NEGATIVE
PROTEIN: NEGATIVE mg/dL
Specific Gravity, Urine: 1.016 (ref 1.005–1.030)
Urobilinogen, UA: 0.2 mg/dL (ref 0.0–1.0)
pH: 6 (ref 5.0–8.0)

## 2015-07-05 LAB — TYPE AND SCREEN
ABO/RH(D): A POS
ANTIBODY SCREEN: NEGATIVE
UNIT DIVISION: 0
UNIT DIVISION: 0

## 2015-07-05 LAB — BASIC METABOLIC PANEL
ANION GAP: 7 (ref 5–15)
BUN: 12 mg/dL (ref 6–20)
CHLORIDE: 107 mmol/L (ref 101–111)
CO2: 24 mmol/L (ref 22–32)
Calcium: 8.9 mg/dL (ref 8.9–10.3)
Creatinine, Ser: 0.83 mg/dL (ref 0.44–1.00)
GFR calc non Af Amer: >60 mL/min (ref >60)
Glucose, Bld: 93 mg/dL (ref 65–99)
POTASSIUM: 4.3 mmol/L (ref 3.5–5.1)
SODIUM: 138 mmol/L (ref 135–145)

## 2015-07-05 LAB — URINE MICROSCOPIC-ADD ON

## 2015-07-05 LAB — CBC
HEMATOCRIT: 25.2 % — AB (ref 36.0–46.0)
HEMOGLOBIN: 7.7 g/dL — AB (ref 12.0–15.0)
MCH: 24.5 pg — ABNORMAL LOW (ref 26.0–34.0)
MCHC: 30.6 g/dL (ref 30.0–36.0)
MCV: 80.3 fL (ref 78.0–100.0)
Platelets: 307 10*3/uL (ref 150–400)
RBC: 3.14 MIL/uL — AB (ref 3.87–5.11)
RDW: 17.4 % — ABNORMAL HIGH (ref 11.5–15.5)
WBC: 6.5 10*3/uL (ref 4.0–10.5)

## 2015-07-05 LAB — T4, FREE: FREE T4: 0.84 ng/dL (ref 0.61–1.12)

## 2015-07-05 LAB — TSH: TSH: 1.685 u[IU]/mL (ref 0.350–4.500)

## 2015-07-05 MED ORDER — DOCUSATE SODIUM 100 MG PO CAPS: 100.0000 mg | ORAL_CAPSULE | Freq: Two times a day (BID) | ORAL | Status: DC

## 2015-07-05 MED ORDER — DOCUSATE SODIUM 100 MG PO CAPS
100.0000 mg | ORAL_CAPSULE | Freq: Two times a day (BID) | ORAL | Status: DC
  Administered 2015-07-05: 100 mg via ORAL
  Filled 2015-07-05: qty 1

## 2015-07-05 MED ORDER — SODIUM CHLORIDE 0.9 % IV SOLN
125.0000 mg | Freq: Once | INTRAVENOUS | Status: AC
  Administered 2015-07-05: 125 mg via INTRAVENOUS
  Filled 2015-07-05: qty 10

## 2015-07-05 MED ORDER — FERROUS SULFATE 325 (65 FE) MG PO TABS
325.0000 mg | ORAL_TABLET | Freq: Two times a day (BID) | ORAL | Status: DC
  Administered 2015-07-05: 325 mg via ORAL
  Filled 2015-07-05: qty 1

## 2015-07-05 MED ORDER — FERROUS SULFATE 325 (65 FE) MG PO TABS: 325.0000 mg | ORAL_TABLET | Freq: Two times a day (BID) | ORAL | Status: DC

## 2015-07-05 MED ORDER — TRAMADOL HCL 50 MG PO TABS: 50.0000 mg | ORAL_TABLET | Freq: Four times a day (QID) | ORAL | Status: DC | PRN

## 2015-07-05 NOTE — Discharge Summary (Signed)
Physician Discharge Summary  Hailey Young ZOX:[id number] DOB: 02-Jun-1951 DOA: 07/04/2015  PCP: No primary care Erman Thum on file.  Admit date: 07/04/2015 Discharge date: 07/05/2015  Recommendations for Outpatient Follow-up:  1. Pt will need to follow up with PCP in 2 weeks post discharge 2. Please obtain CBC in 2 weeks  Discharge Diagnoses:   symptomatic anemia/iron deficiency anemia -Hemoccult-negative -Iron saturation 2%, ferritin 2 -Serum B12 345, folic acid 18.1 -History of previously took iron, but had stopped it for over one year -Education provided regarding importance of taking iron -TSH 1.65 -Transfused 2 units PRBC -Will eventually need GI follow-up for colonoscopy Fatigue/dyspnea on exertion -Symptomatically improved after RBC transfusion -Patient remained hemodynamically stable Hypokalemia -Repleted Obesity -BMI 31.7 -lifestyle modification Chronic knee pain -No synovitis -Advised to discontinue chronic Goody's, NSAID use which the patient took daily -tramadol  #30, no RF, on po q6hrs prn pain -outpt ortho followup  Discharge Condition: stable  Disposition: home  Diet:heart healthy Wt Readings from Last 3 Encounters:  07/05/15 87.272 kg (192 lb 6.4 oz)  08/19/11 82.146 kg (181 lb 1.6 oz)    History of present illness:  64 year old female with no chronic medical problems presented with 3 months of fatigue, generalized weakness, and dyspnea. The patient presented to her primary care Jeannetta Cerutti for evaluation of knee pain. Gen. labs were obtained. It was revealed that she had a low hemoglobin and the patient was referred to the hospital for further evaluation. Hemoglobin was noted to be 5.0. The patient was transferred students PRBC. Posttransfusion hemoglobin was 7.7. The patient symptomatically felt better. There was no chest discomfort, shortness of breath, dizziness. The patient remained hemodynamically stable. The patient was found to be iron  deficient. She was transfused with intravenous iron and instructed to take oral iron twice a day after discharge. She will ultimately need colonoscopy in the future.    Discharge Exam: Filed Vitals:   07/05/15 0751  BP: 111/68  Pulse: 74  Temp: 98.2 F (36.8 C)  Resp: 20   Filed Vitals:   07/04/15 2241 07/05/15 0038 07/05/15 0432 07/05/15 0751  BP: 112/60 110/67 108/66 111/68  Pulse: 97 92 81 74  Temp: 98.7 F (37.1 C) 98.3 F (36.8 C) 98 F (36.7 C) 98.2 F (36.8 C)  TempSrc: Oral Oral Oral Oral  Resp:   18 20  Height:      Weight:   87.272 kg (192 lb 6.4 oz)   SpO2: 99% 98% 98% 96%   General: A&O x 3, NAD, pleasant, cooperative Cardiovascular: RRR, no rub, no gallop, no S3 Respiratory: CTAB, no wheeze, no rhonchi Abdomen:soft, nontender, nondistended, positive bowel sounds Extremities: No edema, No lymphangitis, no petechiae  Discharge Instructions      Discharge Instructions    Diet - low sodium heart healthy    Complete by:  As directed      Increase activity slowly    Complete by:  As directed             Medication List    STOP taking these medications        GOODY HEADACHE PO      TAKE these medications        docusate sodium 100 MG capsule  Commonly known as:  COLACE  Take 1 capsule (100 mg total) by mouth 2 (two) times daily.     ferrous sulfate 325 (65 FE) MG tablet  Take 1 tablet (325 mg total) by mouth 2 (two) times daily with a  meal.     traMADol 50 MG tablet  Commonly known as:  ULTRAM  Take 1 tablet (50 mg total) by mouth every 6 (six) hours as needed for moderate pain.         The results of significant diagnostics from this hospitalization (including imaging, microbiology, ancillary and laboratory) are listed below for reference.    Significant Diagnostic Studies: No results found.   Microbiology: No results found for this or any previous visit (from the past 240 hour(s)).   Labs: Basic Metabolic Panel:  Recent  Labs Lab 07/04/15 1545 07/05/15 0336  NA 139 138  K 3.4* 4.3  CL 107 107  CO2 25 24  GLUCOSE 85 93  BUN 11 12  CREATININE 0.84 0.83  CALCIUM 9.7 8.9   Liver Function Tests:  Recent Labs Lab 07/04/15 1545  AST 20  ALT 14  ALKPHOS 91  BILITOT 0.4  PROT 7.5  ALBUMIN 3.8   No results for input(s): LIPASE, AMYLASE in the last 168 hours. No results for input(s): AMMONIA in the last 168 hours. CBC:  Recent Labs Lab 07/04/15 1545 07/05/15 0336  WBC 3.3* 6.5  NEUTROABS 1.7  --   HGB 5.0* 7.7*  HCT 18.2* 25.2*  MCV 78.1 80.3  PLT 347 307   Cardiac Enzymes: No results for input(s): CKTOTAL, CKMB, CKMBINDEX, TROPONINI in the last 168 hours. BNP: Invalid input(s): POCBNP CBG: No results for input(s): GLUCAP in the last 168 hours.  Time coordinating discharge:  Greater than 30 minutes  Signed:  TAT, DAVID, DO [facility] Pager: [phone] 07/05/2015, 9:29 AM

## 2015-07-06 LAB — URINE CULTURE: SPECIAL REQUESTS: NORMAL

## 2016-11-11 ENCOUNTER — Encounter (HOSPITAL_COMMUNITY): Payer: Self-pay | Admitting: Emergency Medicine

## 2016-11-11 ENCOUNTER — Emergency Department (HOSPITAL_COMMUNITY)
Admission: EM | Admit: 2016-11-11 | Discharge: 2016-11-11 | Disposition: A | Discharge status: Home / Self Care | Payer: BLUE CROSS/BLUE SHIELD | Attending: Emergency Medicine | Admitting: Emergency Medicine

## 2016-11-11 ENCOUNTER — Emergency Department (HOSPITAL_COMMUNITY): Payer: BLUE CROSS/BLUE SHIELD

## 2016-11-11 DIAGNOSIS — J40 Bronchitis, not specified as acute or chronic: Secondary | ICD-10-CM

## 2016-11-11 DIAGNOSIS — R05 Cough: Secondary | ICD-10-CM | POA: Diagnosis present

## 2016-11-11 MED ORDER — PREDNISONE 20 MG PO TABS
60.0000 mg | ORAL_TABLET | Freq: Once | ORAL | Status: AC
  Administered 2016-11-11: 60 mg via ORAL
  Filled 2016-11-11: qty 3

## 2016-11-11 MED ORDER — BENZONATATE 100 MG PO CAPS
100.0000 mg | ORAL_CAPSULE | Freq: Once | ORAL | Status: AC
  Administered 2016-11-11: 100 mg via ORAL
  Filled 2016-11-11: qty 1

## 2016-11-11 MED ORDER — ALBUTEROL SULFATE HFA 108 (90 BASE) MCG/ACT IN AERS: 1.0000 | INHALATION_SPRAY | Freq: Four times a day (QID) | RESPIRATORY_TRACT | 0 refills | Status: DC | PRN

## 2016-11-11 MED ORDER — PREDNISONE 20 MG PO TABS: 40.0000 mg | ORAL_TABLET | Freq: Every day | ORAL | 0 refills | Status: DC

## 2016-11-11 MED ORDER — IPRATROPIUM-ALBUTEROL 0.5-2.5 (3) MG/3ML IN SOLN
3.0000 mL | Freq: Once | RESPIRATORY_TRACT | Status: AC
  Administered 2016-11-11: 3 mL via RESPIRATORY_TRACT
  Filled 2016-11-11: qty 3

## 2016-11-11 MED ORDER — BENZONATATE 100 MG PO CAPS: 100.0000 mg | ORAL_CAPSULE | Freq: Three times a day (TID) | ORAL | 0 refills | Status: DC

## 2016-11-11 MED FILL — BENZONATATE 100 MG CAPSULE: 100 | 5 days supply | Qty: 15 | Fill #0

## 2016-11-11 MED FILL — VENTOLIN HFA 90 MCG INHALER: 108 (90 BAS | 25 days supply | Qty: 18 | Fill #0

## 2016-11-11 MED FILL — predniSONE 20 MG TABS: 20 | 3 days supply | Qty: 6 | Fill #0

## 2016-11-11 NOTE — Discharge Instructions (Signed)
Your chest x-ray was normal. You have a hiatal hernia. You may follow-up with primary care doctor concerning this. Please take the prednisone twice a day for 3 days starting tomorrow. Use the albuterol inhaler as needed. Take Tessalon for cough. Continue using Mucinex over-the-counter. Please follow-up with primary care doctor if you symptoms do not improve. Return to the ED if you develop any worsening symptoms including shortness of breath, chest pain, worsening breathing or for any other reason.

## 2016-11-11 NOTE — ED Triage Notes (Signed)
Pt reports productive cough for the past 2 weeks. Has also had sore throat, back ache, and HA with this.

## 2016-11-11 NOTE — ED Notes (Signed)
Bed: WTR6 Expected date:  Expected time:  Means of arrival:  Comments: 

## 2016-11-11 NOTE — ED Provider Notes (Signed)
[facility]-EMERGENCY DEPT Provider Note   CSN: [id number] Arrival date & time: 11/11/16  4098   By signing my name below, I, [name], attest that this documentation has been prepared under the direction and in the presence of [name], PA-C. Electronically Signed: [name], ED Scribe. 11/11/2016. 10:18 AM.   History   Chief Complaint Chief Complaint  Patient presents with  . Cough   The history is provided by the patient. No language interpreter was used.   HPI Comments:  Hailey Young is a 66 y.o. female who presents to the Emergency Department complaining of a persistent cough x 2 weeks.  Pt states that her cough has been productive with green sputum. Pt complains of associated intermittent sore throat. Pt is not a smoker, and denies sick contact. Pt has taken alka seltzer and robitussin with no relief. Denies hx of asthma, bronchitis, pneumonia, or COPD. Pt denies rhinorrhea, fever, wheezing, cp, sob, n/v/d,  Past Medical History:  Diagnosis Date  . Perianal condylomata     Patient Active Problem List   Diagnosis Date Noted  . Anemia, iron deficiency 07/05/2015  . Obesity 07/05/2015  . Anemia 07/04/2015  . Symptomatic anemia 07/04/2015  . Hypokalemia 07/04/2015  . 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 ablation of perianal warts      OB History    No data available       Home Medications    Prior to Admission medications   Medication Sig Start Date End Date Taking? Authorizing Provider  docusate sodium (COLACE) 100 MG capsule Take 1 capsule (100 mg total) by mouth 2 (two) times daily. 07/05/15   [name], MD  ferrous sulfate 325 (65 FE) MG tablet Take 1 tablet (325 mg total) by mouth 2 (two) times daily with a meal. 07/05/15   [name], MD  traMADol (ULTRAM) 50 MG tablet Take 1 tablet (50 mg total) by mouth every 6 (six) hours as needed for moderate pain.  07/05/15   [name], MD    Family History History reviewed. No pertinent family history.  Social History Social History  Substance Use Topics  . Smoking status: Never Smoker  . Smokeless tobacco: Never Used  . Alcohol use No     Allergies   Patient has no known allergies.   Review of Systems Review of Systems 10 Systems reviewed and are negative for acute change except as noted in the HPI.   Physical Exam Updated Vital Signs BP (!) 156/106 (BP Location: Left Arm)   Pulse 80   Temp 98.2 F (36.8 C) (Oral)   Resp 18   SpO2 97%   Physical Exam  Constitutional: She appears well-developed and well-nourished. No distress.  HENT:  Head: Normocephalic and atraumatic.  Right Ear: Tympanic membrane, external ear and ear canal normal.  Left Ear: Tympanic membrane, external ear and ear canal normal.  Nose: Nose normal.  Mouth/Throat: Uvula is midline, oropharynx is clear and moist and mucous membranes are normal.  Eyes: Conjunctivae are normal. Pupils are equal, round, and reactive to light.  Neck: Normal range of motion. Neck supple.  Cardiovascular: Normal rate, regular rhythm, normal heart sounds and intact distal pulses.  Exam reveals no gallop and no friction rub.   No murmur heard. Pulmonary/Chest: Effort normal and breath sounds normal. No respiratory distress. She has no wheezes.  CTAB.  Abdominal: She exhibits no distension.  Lymphadenopathy:  She has no cervical adenopathy.  Neurological: She is alert.  Skin: Skin is warm and dry. Capillary refill takes less than 2 seconds.  Psychiatric: She has a normal mood and affect.  Nursing note and vitals reviewed.    ED Treatments / Results  DIAGNOSTIC STUDIES:  Oxygen Saturation is 94% on RA, adequate by my interpretation.    COORDINATION OF CARE:  10:17 AM Will order chest xray. Discussed treatment plan with pt at bedside and pt agreed to plan.   Labs (all labs ordered are listed, but only abnormal results  are displayed) Labs Reviewed - No data to display  EKG  EKG Interpretation None       Radiology No results found.  Procedures Procedures (including critical care time)  Medications Ordered in ED Medications  ipratropium-albuterol (DUONEB) 0.5-2.5 (3) MG/3ML nebulizer solution 3 mL (3 mLs Nebulization Given 11/11/16 1055)  benzonatate (TESSALON) capsule 100 mg (100 mg Oral Given 11/11/16 1040)  predniSONE (DELTASONE) tablet 60 mg (60 mg Oral Given 11/11/16 1132)     Initial Impression / Assessment and Plan / ED Course   I have reviewed the triage vital signs and the nursing notes.  Pertinent labs & imaging results that were available during my care of the patient were reviewed by me and considered in my medical decision making (see chart for details).    Pt presents with cough. CXR negative for focal infiltrate. Pt given duo neb in the ED with sig relief in symptoms. Informed of hiatal hernia seen on cxr. Likely viral bronchtitis. Will treat with steroids, albuterol, and cough medicine. Patient is afebrile, not tachycardic, hypoxic or tapncyniec. Pt denies cp or sob. Low suspicion for ACS or PE. Pt is hemodynamically stable, in NAD, & able to ambulate in the ED. Pain has been managed & has no complaints prior to dc. Pt is comfortable with above plan and is stable for discharge at this time. All questions were answered prior to disposition. Strict return precautions for f/u to the ED were discussed.Encouraged to follow up with PCP. Dicussed pt with Dr. Particia NearingHaviland who is agreeable to the above plan.   Final Clinical Impressions(s) / ED Diagnoses   Final diagnoses:  Bronchitis    New Prescriptions Discharge Medication List as of 11/11/2016 11:27 AM    START taking these medications   Details  albuterol (PROVENTIL HFA;VENTOLIN HFA) 108 (90 Base) MCG/ACT inhaler Inhale 1-2 puffs into the lungs every 6 (six) hours as needed for wheezing or shortness of breath., Starting Thu 11/11/2016,  Print    benzonatate (TESSALON) 100 MG capsule Take 1 capsule (100 mg total) by mouth every 8 (eight) hours., Starting Thu 11/11/2016, Print    predniSONE (DELTASONE) 20 MG tablet Take 2 tablets (40 mg total) by mouth daily with breakfast., Starting Thu 11/11/2016, Print      I personally performed the services described in this documentation, which was scribed in my presence. The recorded information has been reviewed and is accurate.     Rise MuKenneth T Durene Dodge, PA-C 11/15/16 1234    Jacalyn LefevreJulie Haviland, MD 11/15/16 [phone]

## 2016-11-23 ENCOUNTER — Encounter: Payer: Self-pay | Admitting: Family Medicine

## 2016-11-23 ENCOUNTER — Ambulatory Visit (INDEPENDENT_AMBULATORY_CARE_PROVIDER_SITE_OTHER): Payer: BLUE CROSS/BLUE SHIELD | Admitting: Family Medicine

## 2016-11-23 VITALS — BP 140/80 | HR 80 | Temp 98.3°F | Resp 16 | Ht 67.0 in | Wt 196.0 lb

## 2016-11-23 DIAGNOSIS — R030 Elevated blood-pressure reading, without diagnosis of hypertension: Secondary | ICD-10-CM

## 2016-11-23 DIAGNOSIS — Z1231 Encounter for screening mammogram for malignant neoplasm of breast: Secondary | ICD-10-CM | POA: Diagnosis not present

## 2016-11-23 DIAGNOSIS — D649 Anemia, unspecified: Secondary | ICD-10-CM

## 2016-11-23 DIAGNOSIS — E8881 Metabolic syndrome: Secondary | ICD-10-CM | POA: Diagnosis not present

## 2016-11-23 DIAGNOSIS — E669 Obesity, unspecified: Secondary | ICD-10-CM | POA: Diagnosis not present

## 2016-11-23 DIAGNOSIS — Z23 Encounter for immunization: Secondary | ICD-10-CM | POA: Diagnosis not present

## 2016-11-23 DIAGNOSIS — Z1239 Encounter for other screening for malignant neoplasm of breast: Secondary | ICD-10-CM

## 2016-11-23 DIAGNOSIS — M1990 Unspecified osteoarthritis, unspecified site: Secondary | ICD-10-CM

## 2016-11-23 DIAGNOSIS — Z1211 Encounter for screening for malignant neoplasm of colon: Secondary | ICD-10-CM

## 2016-11-23 LAB — COMPLETE METABOLIC PANEL WITH GFR
ALK PHOS: 75 U/L (ref 33–130)
ALT: 14 U/L (ref 6–29)
AST: 14 U/L (ref 10–35)
Albumin: 3.9 g/dL (ref 3.6–5.1)
BILIRUBIN TOTAL: 0.3 mg/dL (ref 0.2–1.2)
BUN: 16 mg/dL (ref 7–25)
CALCIUM: 9.2 mg/dL (ref 8.6–10.4)
CO2: 27 mmol/L (ref 20–31)
CREATININE: 0.83 mg/dL (ref 0.50–0.99)
Chloride: 108 mmol/L (ref 98–110)
GFR, Est African American: 86 mL/min (ref >=60)
GFR, Est Non African American: 75 mL/min (ref >=60)
Glucose, Bld: 104 mg/dL — ABNORMAL HIGH (ref 65–99)
Potassium: 4.1 mmol/L (ref 3.5–5.3)
Sodium: 142 mmol/L (ref 135–146)
TOTAL PROTEIN: 6.6 g/dL (ref 6.1–8.1)

## 2016-11-23 LAB — LIPID PANEL
CHOLESTEROL: 227 mg/dL — AB (ref <200)
HDL: 46 mg/dL — ABNORMAL LOW (ref >50)
LDL Cholesterol: 136 mg/dL — ABNORMAL HIGH (ref <100)
TRIGLYCERIDES: 224 mg/dL — AB (ref <150)
Total CHOL/HDL Ratio: 4.9 Ratio (ref <5.0)
VLDL: 45 mg/dL — AB (ref <30)

## 2016-11-23 LAB — CBC WITH DIFFERENTIAL/PLATELET
BASOS PCT: 1 %
Basophils Absolute: 28 cells/uL (ref 0–200)
EOS PCT: 4 %
Eosinophils Absolute: 112 cells/uL (ref 15–500)
HCT: 35.6 % (ref 35.0–45.0)
Hemoglobin: 11.8 g/dL (ref 11.7–15.5)
LYMPHS PCT: 54 %
Lymphs Abs: 1512 cells/uL (ref 850–3900)
MCH: 31.7 pg (ref 27.0–33.0)
MCHC: 33.1 g/dL (ref 32.0–36.0)
MCV: 95.7 fL (ref 80.0–100.0)
MONOS PCT: 7 %
MPV: 10.8 fL (ref 7.5–12.5)
Monocytes Absolute: 196 cells/uL — ABNORMAL LOW (ref 200–950)
Neutro Abs: 952 cells/uL — ABNORMAL LOW (ref 1500–7800)
Neutrophils Relative %: 34 %
PLATELETS: 188 10*3/uL (ref 140–400)
RBC: 3.72 MIL/uL — AB (ref 3.80–5.10)
RDW: 15.2 % — ABNORMAL HIGH (ref 11.0–15.0)
WBC: 2.8 10*3/uL — AB (ref 3.8–10.8)

## 2016-11-23 LAB — POCT GLYCOSYLATED HEMOGLOBIN (HGB A1C): HEMOGLOBIN A1C: 5.2

## 2016-11-23 MED ORDER — MENTHOL (TOPICAL ANALGESIC) 4 % EX GEL: 1.0000 | Freq: Four times a day (QID) | CUTANEOUS | 0 refills | Status: AC | PRN

## 2016-11-23 NOTE — Progress Notes (Signed)
Subjective:    Patient ID: Hailey Young, female    DOB: Aug 15, 1951, 66 y.o.   MRN: [id number]  HPI Hailey Young, a 66 year old female with a history of anemia presents to establish care. She says that she has not had a primary provider in many years. She primarily uses the emergency department for primary needs.  Sentinal symptom the patient feels fatigue began with: symptoms of arthritis.   Patient denies significant change in weight, exercise intolerance, unusual rashes, cold intolerance, constipation and change in hair texture., GI blood loss, excessive menstrual bleeding and witnessed or suspected sleep apnea.  Patient also here for follow-up of elevated blood pressure. She is not exercising and is not adherent to low salt diet.  She does not check blood pressures at home.   Patient denies dyspnea, irregular heart beat, lower extremity edema, syncope and tachypnea.  Cardiovascular risk factors include: obesity (BMI >= 30 kg/m2) and sedentary lifestyle.  Past Medical History:  Diagnosis Date  . Perianal condylomata    Social History   Social History  . Marital status: Single    Spouse name: N/A  . Number of children: N/A  . Years of education: N/A   Occupational History  . Not on file.   Social History Main Topics  . Smoking status: Never Smoker  . Smokeless tobacco: Never Used  . Alcohol use No  . Drug use: No  . Sexual activity: Not on file   Other Topics Concern  . Not on file   Social History Narrative  . No narrative on file   Review of Systems  Constitutional: Positive for fatigue.  HENT: Negative.   Eyes: Negative.   Respiratory: Negative.   Cardiovascular: Negative.   Gastrointestinal: Negative.   Endocrine: Negative.  Negative for polydipsia, polyphagia and polyuria.  Genitourinary: Negative.   Musculoskeletal: Positive for arthralgias.  Skin: Negative.   Allergic/Immunologic: Negative.   Neurological: Negative.   Hematological: Negative.    Psychiatric/Behavioral: Negative.        Objective:   Physical Exam  Constitutional: She is oriented to person, place, and time.  Neck: Normal range of motion. Neck supple.  Abdominal: Soft. Bowel sounds are normal.  Neurological: She is alert and oriented to person, place, and time. She has normal reflexes.  Skin: Skin is warm and dry.  Psychiatric: She has a normal mood and affect. Her behavior is normal. Judgment and thought content normal.      BP (!) 159/85 (BP Location: Right Arm, Patient Position: Sitting, Cuff Size: Normal)   Pulse 80   Temp 98.3 F (36.8 C) (Oral)   Resp 16   Ht 5\' 7"  (1.702 m)   Wt 196 lb (88.9 kg)   SpO2 95%   BMI 30.70 kg/m  Assessment & Plan:  1. Anemia, unspecified type  - CBC with Differential  2. Obesity (BMI 30-39.9) Recommend a lowfat, low carbohydrate diet divided over 5-6 small meals, increase water intake to 6-8 glasses, and 150 minutes per week of cardiovascular exercise.    3. Metabolic syndrome - HgB A1c - Lipid Panel - COMPLETE METABOLIC PANEL WITH GFR  4. Elevated blood pressure, situational Return to the clinic in 1 week for a blood pressure check  5. Breast cancer screening - MM Digital Screening; Future  6. Colon cancer screening - Ambulatory referral to Gastroenterology  7. Need for Tdap vaccination - Tdap vaccine greater than or equal to 7yo IM  8. Arthritis - Rheumatoid factor -  Menthol, Topical Analgesic, (BIOFREEZE) 4 % GEL; Apply 1 each topically every 6 (six) hours as needed.  Dispense: 1 Tube; Refill: 0    Health maintenance:  Pap smear: 3 months for pap smear Referral for mammogram sent Referral for colonoscopy sent     RTC: 3 months for chronic conditions    Bladimir Auman M, FNP

## 2016-11-23 NOTE — Patient Instructions (Addendum)
Sent referral for mammogram Sent referral for colonoscopy. Apply Biofreeze to affected joints for arthritis pain.  Recommend a lowfat, low carbohydrate diet divided over 5-6 small meals, increase water intake to 6-8 glasses, and 150 minutes per week of cardiovascular exercise.   Arthritis Introduction Arthritis means joint pain. It can also mean joint disease. A joint is a place where bones come together. People who have arthritis may have:  Red joints.  Swollen joints.  Stiff joints.  Warm joints.  A fever.  A feeling of being sick. Follow these instructions at home: Pay attention to any changes in your symptoms. Take these actions to help with your pain and swelling. Medicines  Take over-the-counter and prescription medicines only as told by your doctor.  Do not take aspirin for pain if your doctor says that you may have gout. Activity  Rest your joint if your doctor tells you to.  Avoid activities that make the pain worse.  Exercise your joint regularly as told by your doctor. Try doing exercises like:  Swimming.  Water aerobics.  Biking.  Walking. Joint Care   If your joint is swollen, keep it raised (elevated) if told by your doctor.  If your joint feels stiff in the morning, try taking a warm shower.  If you have diabetes, do not apply heat without asking your doctor.  If told, apply heat to the joint:  Put a towel between the joint and the hot pack or heating pad.  Leave the heat on the area for 20-30 minutes.  If told, apply ice to the joint:  Put ice in a plastic bag.  Place a towel between your skin and the bag.  Leave the ice on for 20 minutes, 2-3 times per day.  Keep all follow-up visits as told by your doctor. Contact a doctor if:  The pain gets worse.  You have a fever. Get help right away if:  You have very bad pain in your joint.  You have swelling in your joint.  Your joint is red.  Many joints become painful and  swollen.  You have very bad back pain.  Your leg is very weak.  You cannot control your pee (urine) or poop (stool). This information is not intended to replace advice given to you by your health care provider. Make sure you discuss any questions you have with your health care provider. Document Released: 12/29/2009 Document Revised: 03/11/2016 Document Reviewed: 12/30/2014  2017 Elsevier  Obesity, Adult Introduction Obesity is having too much body fat. If you have a BMI of 30 or more, you are obese. BMI is a number that explains how much body fat you have. Obesity is often caused by taking in (consuming) more calories than your body uses. Obesity can cause serious health problems. Changing your lifestyle can help to treat obesity. Follow these instructions at home: Eating and drinking  Follow advice from your doctor about what to eat and drink. Your doctor may tell you to:  Cut down on (limit) fast foods, sweets, and processed snack foods.  Choose low-fat options. For example, choose low-fat milk instead of whole milk.  Eat 5 or more servings of fruits or vegetables every day.  Eat at home more often. This gives you more control over what you eat.  Choose healthy foods when you eat out.  Learn what a healthy portion size is. A portion size is the amount of a certain food that is healthy for you to eat at one time. This  is different for each person.  Keep low-fat snacks available.  Avoid sugary drinks. These include soda, fruit juice, iced tea that is sweetened with sugar, and flavored milk.  Eat a healthy breakfast.  Drink enough water to keep your pee (urine) clear or pale yellow.  Do not go without eating for long periods of time (do not fast).  Do not go on popular or trendy diets (fad diets). Physical Activity  Exercise often, as told by your doctor. Ask your doctor:  What types of exercise are safe for you.  How often you should exercise.  Warm up and stretch  before being active.  Do slow stretching after being active (cool down).  Rest between times of being active. Lifestyle  Limit how much time you spend in front of your TV, computer, or video game system (be less sedentary).  Find ways to reward yourself that do not involve food.  Limit alcohol intake to no more than 1 drink a day for nonpregnant women and 2 drinks a day for men. One drink equals 12 oz of beer, 5 oz of wine, or 1 oz of hard liquor. General instructions  Keep a weight loss journal. This can help you keep track of:  The food that you eat.  The exercise that you do.  Take over-the-counter and prescription medicines only as told by your doctor.  Take vitamins and supplements only as told by your doctor.  Think about joining a support group. Your doctor may be able to help with this.  Keep all follow-up visits as told by your doctor. This is important. Contact a doctor if:  You cannot meet your weight loss goal after you have changed your diet and lifestyle for 6 weeks. This information is not intended to replace advice given to you by your health care provider. Make sure you discuss any questions you have with your health care provider. Document Released: 12/27/2011 Document Revised: 03/11/2016 Document Reviewed: 07/23/2015  2017 Elsevier

## 2016-11-24 ENCOUNTER — Other Ambulatory Visit: Payer: Self-pay | Admitting: Family Medicine

## 2016-11-24 DIAGNOSIS — E785 Hyperlipidemia, unspecified: Secondary | ICD-10-CM

## 2016-11-24 LAB — RHEUMATOID FACTOR: Rhuematoid fact SerPl-aCnc: <14 IU/mL (ref <14)

## 2016-11-24 MED ORDER — ATORVASTATIN CALCIUM 20 MG PO TABS: 20.0000 mg | ORAL_TABLET | Freq: Every day | ORAL | 3 refills | Status: DC

## 2016-11-24 MED ORDER — ASPIRIN EC 81 MG PO TBEC: 81.0000 mg | DELAYED_RELEASE_TABLET | Freq: Every day | ORAL | 11 refills | Status: AC

## 2016-11-24 NOTE — Progress Notes (Signed)
Meds ordered this encounter  Medications  . atorvastatin (LIPITOR) 20 MG tablet    Sig: Take 1 tablet (20 mg total) by mouth daily.    Dispense:  90 tablet    Refill:  3  . aspirin EC 81 MG tablet    Sig: Take 1 tablet (81 mg total) by mouth daily.    Dispense:  30 tablet    Refill:  11    Thania Woodlief M, FNP

## 2016-11-24 NOTE — Progress Notes (Signed)
Called and spoke with patient, advised of elevated cholesterol and to start atorvastatin 20mg  every evening. Advised to start low fat/low carb diet over 5 to 6 small meals daily, drink 6 to 8 glasses of water daily, and exercise 150 minutes weekly of cardio exercise. Patient verbalized understanding. Thanks!

## 2016-12-07 ENCOUNTER — Ambulatory Visit: Payer: BLUE CROSS/BLUE SHIELD

## 2016-12-07 DIAGNOSIS — R030 Elevated blood-pressure reading, without diagnosis of hypertension: Secondary | ICD-10-CM

## 2016-12-08 DIAGNOSIS — R030 Elevated blood-pressure reading, without diagnosis of hypertension: Secondary | ICD-10-CM | POA: Insufficient documentation

## 2016-12-17 ENCOUNTER — Ambulatory Visit (INDEPENDENT_AMBULATORY_CARE_PROVIDER_SITE_OTHER): Payer: BLUE CROSS/BLUE SHIELD | Admitting: Family Medicine

## 2016-12-17 ENCOUNTER — Encounter: Payer: Self-pay | Admitting: Family Medicine

## 2016-12-17 VITALS — BP 112/82 | HR 95 | Temp 98.4°F | Resp 16 | Ht 67.0 in | Wt 198.0 lb

## 2016-12-17 DIAGNOSIS — M1990 Unspecified osteoarthritis, unspecified site: Secondary | ICD-10-CM | POA: Diagnosis not present

## 2016-12-17 DIAGNOSIS — G8929 Other chronic pain: Secondary | ICD-10-CM

## 2016-12-17 DIAGNOSIS — M25561 Pain in right knee: Secondary | ICD-10-CM

## 2016-12-17 MED ORDER — KETOROLAC TROMETHAMINE 60 MG/2ML IM SOLN
60.0000 mg | Freq: Once | INTRAMUSCULAR | Status: AC
  Administered 2016-12-17: 60 mg via INTRAMUSCULAR

## 2016-12-17 MED ORDER — MELOXICAM 7.5 MG PO TABS: 7.5000 mg | ORAL_TABLET | Freq: Every day | ORAL | 0 refills | Status: DC

## 2016-12-17 NOTE — Progress Notes (Signed)
Subjective:    Patient ID: Hailey BillsBeverly A Bilal, female    DOB: November 11, 1950, 66 y.o.   MRN: [id number]  Knee Pain   The pain is present in the right knee. The pain is at a severity of 8/10. The pain is moderate. The pain has been constant since onset. Pertinent negatives include no inability to bear weight, loss of motion, loss of sensation, muscle weakness, numbness or tingling. She has tried acetaminophen, non-weight bearing and rest for the symptoms. The treatment provided mild relief.    Past Medical History:  Diagnosis Date  . Perianal condylomata    Social History   Social History  . Marital status: Single    Spouse name: N/A  . Number of children: N/A  . Years of education: N/A   Occupational History  . Not on file.   Social History Main Topics  . Smoking status: Never Smoker  . Smokeless tobacco: Never Used  . Alcohol use No  . Drug use: No  . Sexual activity: Not on file   Other Topics Concern  . Not on file   Social History Narrative  . No narrative on file   Review of Systems  Constitutional: Positive for fatigue.  HENT: Negative.   Eyes: Negative.   Respiratory: Negative.   Cardiovascular: Negative.   Gastrointestinal: Negative.   Endocrine: Negative.  Negative for polydipsia, polyphagia and polyuria.  Genitourinary: Negative.   Musculoskeletal: Positive for arthralgias (right knee pain).  Skin: Negative.   Allergic/Immunologic: Negative.   Neurological: Negative.  Negative for tingling and numbness.  Hematological: Negative.   Psychiatric/Behavioral: Negative.        Objective:   Physical Exam  Constitutional: She is oriented to person, place, and time.  HENT:  Head: Normocephalic and atraumatic.  Right Ear: External ear normal.  Left Ear: External ear normal.  Nose: Nose normal.  Mouth/Throat: Oropharynx is clear and moist.  Eyes: Conjunctivae and EOM are normal. Pupils are equal, round, and reactive to light.  Neck: Normal range of motion.  Neck supple.  Abdominal: Soft. Bowel sounds are normal.  Musculoskeletal:       Right knee: She exhibits decreased range of motion and swelling.       Left knee: She exhibits decreased range of motion.  Neurological: She is alert and oriented to person, place, and time. She has normal reflexes.  Skin: Skin is warm and dry.  Psychiatric: She has a normal mood and affect. Her behavior is normal. Judgment and thought content normal.      BP 112/82 (BP Location: Left Arm, Patient Position: Sitting, Cuff Size: Normal)   Pulse 95   Temp 98.4 F (36.9 C) (Oral)   Resp 16   Ht 5\' 7"  (1.702 m)   Wt 198 lb (89.8 kg)   SpO2 97%   BMI 31.01 kg/m  Assessment & Plan:   1. Chronic pain of right knee - DG Knee Complete 4 Views Right; Future - meloxicam (MOBIC) 7.5 MG tablet; Take 1 tablet (7.5 mg total) by mouth daily.  Dispense: 30 tablet; Refill: 0 - ketorolac (TORADOL) injection 60 mg; Inject 2 mLs (60 mg total) into the muscle once.  2. Arthritis - meloxicam (MOBIC) 7.5 MG tablet; Take 1 tablet (7.5 mg total) by mouth daily.  Dispense: 30 tablet; Refill: 0 - ketorolac (TORADOL) injection 60 mg; Inject 2 mLs (60 mg total) into the muscle once.  - Menthol, Topical Analgesic, (BIOFREEZE) 4 % GEL; Apply 1 each topically every 6 (six)  hours as needed.  Dispense: 1 Tube; Refill: 0    Health maintenance:  Pap smear: 3 months for pap smear Referral for mammogram sent Referral for colonoscopy sent     RTC: 3 months for chronic conditions    Jadelin Eng M, FNP

## 2016-12-20 ENCOUNTER — Ambulatory Visit (HOSPITAL_COMMUNITY)
Admission: RE | Admit: 2016-12-20 | Discharge: 2016-12-20 | Disposition: A | Discharge status: Home / Self Care | Payer: BLUE CROSS/BLUE SHIELD | Source: Ambulatory Visit | Attending: Family Medicine | Admitting: Family Medicine

## 2016-12-20 DIAGNOSIS — G8929 Other chronic pain: Secondary | ICD-10-CM

## 2016-12-20 DIAGNOSIS — M1711 Unilateral primary osteoarthritis, right knee: Secondary | ICD-10-CM | POA: Diagnosis not present

## 2016-12-20 DIAGNOSIS — M25561 Pain in right knee: Secondary | ICD-10-CM | POA: Diagnosis present

## 2016-12-20 DIAGNOSIS — M25761 Osteophyte, right knee: Secondary | ICD-10-CM | POA: Insufficient documentation

## 2016-12-21 ENCOUNTER — Other Ambulatory Visit: Payer: Self-pay | Admitting: Family Medicine

## 2016-12-21 DIAGNOSIS — M1711 Unilateral primary osteoarthritis, right knee: Secondary | ICD-10-CM

## 2016-12-21 NOTE — Progress Notes (Signed)
[name], a 66 year old female with a history of chronic right knee pain that has been worsening over the past several month. Will send a referral to orthopedic services for further work up and evaluation.   IMPRESSION: Advanced and progressive tricompartmental degenerative changes butno acute bony abnormality.hs. Reviewed imaging.    Massie MaroonHollis,Hailey Lauman M, FNP

## 2016-12-22 ENCOUNTER — Encounter: Payer: Self-pay | Admitting: Family Medicine

## 2017-01-05 ENCOUNTER — Ambulatory Visit (INDEPENDENT_AMBULATORY_CARE_PROVIDER_SITE_OTHER): Payer: BLUE CROSS/BLUE SHIELD | Admitting: Orthopaedic Surgery

## 2017-01-05 DIAGNOSIS — M1711 Unilateral primary osteoarthritis, right knee: Secondary | ICD-10-CM | POA: Diagnosis not present

## 2017-01-05 DIAGNOSIS — G8929 Other chronic pain: Secondary | ICD-10-CM | POA: Diagnosis not present

## 2017-01-05 DIAGNOSIS — M25561 Pain in right knee: Secondary | ICD-10-CM | POA: Diagnosis not present

## 2017-01-05 NOTE — Progress Notes (Signed)
Office Visit Note   Patient: Hailey Young           Date of Birth: 08/12/1951           MRN: [id number] Visit Date: 01/05/2017              Requested by: Massie Maroon, FNP [address] [place], [place] [address] PCP: Massie Maroon, FNP   Assessment & Plan: Visit Diagnoses:  1. Chronic pain of right knee   2. Unilateral primary osteoarthritis, right knee     Plan: We decided that she would try Synvisc 1/hyaluronic acid for her right knee. She no she has tricompartmental arthritic changes and she is artery tried a steroid injection and anti-inflammatories as well as activity modification. We will have her work on quad strengthening exercises well. I showed her knee modeling spent in detail with the disease process is with her knee. We went over x-rays as well. I do feel that at some point she'll be a candidate for knee replacement surgery when she is ready but less certainly try all conservative treatment measures for now. We'll see her back for the hyaluronic acid injection once it is approved and ordered  Follow-Up Instructions: Return in about 4 weeks (around 02/02/2017).   Orders:  No orders of the defined types were placed in this encounter.  No orders of the defined types were placed in this encounter.     Procedures: No procedures performed   Clinical Data: No additional findings.   Subjective: No chief complaint on file. The patient comes in with chief complaint of right knee pain is been hurting for many years now. She's had a steroid injection that knee and then on meloxicam another that is helped. This is been slowly getting worse with time. His detrimentally affected her activities daily living, her quality of life, and her mobility. It hurts at night it hurts in the daytime. Her sensation sitting for long period time. She's been getting a lot of popping in her knee but no locking catching.  HPI  Review of Systems She denies any chest pain,  headache, shortness of breath, fever, chills, nausea, vomiting. She denies any active medical problems.  Objective: Vital Signs: There were no vitals taken for this visit.  Physical Exam He is alert and oriented 3 in no acute distress Ortho Exam Examination of her right knee does show a varus deformity. There is medial joint line tenderness as well as patellofemoral crepitation. Her patella tracked slightly laterally. She has a mild effusion as well. Range of motion is full but stiff. Her Lockman's exam is negative. Specialty Comments:  No specialty comments available.  Imaging: No results found. X-rays on the canopy system and independently reviewed by me of her right knee shows significant tricompartmental arthritic changes. There sclerotic and cystic changes of the knee. This para-articular osteophytes and loose bodies as well as medial joint space narrowing and varus malalignment.  PMFS History: Patient Active Problem List   Diagnosis Date Noted  . Elevated blood-pressure reading without diagnosis of hypertension 12/08/2016  . Obesity (BMI 30-39.9) 11/23/2016  . Metabolic syndrome 11/23/2016  . Arthritis 11/23/2016  . Anemia, iron deficiency 07/05/2015  . Obesity 07/05/2015  . Anemia 07/04/2015  . Symptomatic anemia 07/04/2015  . Hypokalemia 07/04/2015  . 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 Perianal condylomata     No family history on file.  Past Surgical  History:  Procedure Laterality Date  . ablation of perianal warts     Social History   Occupational History  . Not on file.   Social History Main Topics  . Smoking status: Never Smoker  . Smokeless tobacco: Never Used  . Alcohol use No  . Drug use: No  . Sexual activity: Not on file

## 2017-02-02 ENCOUNTER — Ambulatory Visit (INDEPENDENT_AMBULATORY_CARE_PROVIDER_SITE_OTHER): Payer: BLUE CROSS/BLUE SHIELD | Admitting: Orthopaedic Surgery

## 2017-02-02 DIAGNOSIS — M25561 Pain in right knee: Secondary | ICD-10-CM

## 2017-02-02 DIAGNOSIS — G8929 Other chronic pain: Secondary | ICD-10-CM

## 2017-02-02 DIAGNOSIS — M1711 Unilateral primary osteoarthritis, right knee: Secondary | ICD-10-CM | POA: Insufficient documentation

## 2017-02-02 DIAGNOSIS — M25562 Pain in left knee: Secondary | ICD-10-CM

## 2017-02-02 HISTORY — DX: Unilateral primary osteoarthritis, right knee: M17.11

## 2017-02-02 NOTE — Progress Notes (Signed)
The patient comes in today for a scheduled hyaluronic acid injection in her right knee. This is due to right knee pain and osteoarthritis. She comes in those saying that now her left knee is hurting and feel she is an injection that knee today however she has not had any type of hyaluronic acid injection or other injections in the left knee.  On the right knee she has a varus deformity that knee. Again her x-rays consistent with osteoarthritis of the right knee. Her left knee does not have the same deformity in terms of varus malalignment there is numbness and swelling but I can see where her try on exam.  She tolerated Synvisc 1 injection well and her right knee that was are prearranged. We'll see her back in a month to place a Synvisc 1 injection in her left knee.

## 2017-02-17 ENCOUNTER — Ambulatory Visit
Admission: RE | Admit: 2017-02-17 | Discharge: 2017-02-17 | Disposition: A | Discharge status: Home / Self Care | Payer: BLUE CROSS/BLUE SHIELD | Source: Ambulatory Visit | Attending: Family Medicine | Admitting: Family Medicine

## 2017-02-17 DIAGNOSIS — Z1239 Encounter for other screening for malignant neoplasm of breast: Secondary | ICD-10-CM

## 2017-02-24 ENCOUNTER — Encounter: Payer: Self-pay | Admitting: Family Medicine

## 2017-02-24 ENCOUNTER — Ambulatory Visit (INDEPENDENT_AMBULATORY_CARE_PROVIDER_SITE_OTHER): Payer: BLUE CROSS/BLUE SHIELD | Admitting: Family Medicine

## 2017-02-24 ENCOUNTER — Other Ambulatory Visit (HOSPITAL_COMMUNITY)
Admission: RE | Admit: 2017-02-24 | Discharge: 2017-02-24 | Disposition: A | Discharge status: Home / Self Care | Payer: BLUE CROSS/BLUE SHIELD | Source: Ambulatory Visit | Attending: Family Medicine | Admitting: Family Medicine

## 2017-02-24 VITALS — BP 130/90 | HR 80 | Temp 98.5°F | Resp 16 | Ht 67.0 in | Wt 195.0 lb

## 2017-02-24 DIAGNOSIS — M1990 Unspecified osteoarthritis, unspecified site: Secondary | ICD-10-CM | POA: Insufficient documentation

## 2017-02-24 DIAGNOSIS — E8881 Metabolic syndrome: Secondary | ICD-10-CM | POA: Diagnosis not present

## 2017-02-24 DIAGNOSIS — D509 Iron deficiency anemia, unspecified: Secondary | ICD-10-CM | POA: Diagnosis not present

## 2017-02-24 DIAGNOSIS — E876 Hypokalemia: Secondary | ICD-10-CM | POA: Insufficient documentation

## 2017-02-24 DIAGNOSIS — A630 Anogenital (venereal) warts: Secondary | ICD-10-CM | POA: Diagnosis not present

## 2017-02-24 DIAGNOSIS — Z6839 Body mass index (BMI) 39.0-39.9, adult: Secondary | ICD-10-CM | POA: Diagnosis not present

## 2017-02-24 DIAGNOSIS — M1711 Unilateral primary osteoarthritis, right knee: Secondary | ICD-10-CM | POA: Insufficient documentation

## 2017-02-24 DIAGNOSIS — Z01419 Encounter for gynecological examination (general) (routine) without abnormal findings: Secondary | ICD-10-CM

## 2017-02-24 DIAGNOSIS — E669 Obesity, unspecified: Secondary | ICD-10-CM | POA: Insufficient documentation

## 2017-02-24 MED ORDER — IMIQUIMOD 5 % EX CREA: TOPICAL_CREAM | CUTANEOUS | 1 refills | Status: DC

## 2017-02-24 NOTE — Patient Instructions (Addendum)
Condylomata acuminata: Apply cream at bedtime; a thin layer 3 times per week until warts resolve. Leave on from 6 to 10 hours. Wash with mild soap and water. Will apply for 16 weeks. Refrain from sexual contact while cream is on the skin.    Kegel exercise, also known as pelvic floor exercise, consists of repeatedly contracting and relaxing the muscles that form part of the pelvic floor, now sometimes colloquially referred to as the "Kegel muscles." The exercise can be performed multiple times each day, for several minutes at a time, for one to three months, to begin to have an effect.   Genital Warts Genital warts are small growths in the genital area or anal area. They are caused by a type of germ (HPV virus). This germ is spread from person to person during sex. It can be spread through vaginal, anal, and oral sex. Genital warts can lead to other problems if they are not treated. Follow these instructions at home: Medicines   Apply over-the-counter and prescription medicines only as told by your doctor.  Do not use medicines that are meant for treating hand warts.  Talk with your doctor about using anti-itch creams. General instructions   Do not touch or scratch the warts.  Do not have sex until your treatment is done.  Tell your current and past sexual partners about your condition. They may need treatment.  Keep all follow-up visits as told by your doctor. This is important.  After treatment, use condoms during sex. Other Instructions for Women   Women who have genital warts may need to be checked more often for cervical cancer.  If you become pregnant, tell your doctor that you have had genital warts. The germ can be passed to the baby. Contact a doctor if:  You have redness, swelling, or pain in the area of the treated skin.  You have a fever.  You feel generally sick.  You feel lumps in and around your genital area or anal area.  You have bleeding in your genital  area or anal area.  You have pain during sex. This information is not intended to replace advice given to you by your health care provider. Make sure you discuss any questions you have with your health care provider. Document Released: 12/29/2009 Document Revised: 03/11/2016 Document Reviewed: 12/30/2014 Elsevier Interactive Patient Education  2017 Elsevier Inc. Imiquimod skin cream What is this medicine? IMIQUIMOD (i mi KWI mod) cream is used to treat external genital or anal warts. It is also used to treat other skin conditions such as actinic keratosis and certain types of skin cancer. This medicine may be used for other purposes; ask your health care provider or pharmacist if you have questions. COMMON BRAND NAME(S): Celesta AverAldara, Zyclara What should I tell my health care provider before I take this medicine? They need to know if you have any of these conditions: -decreased immune function -an unusual or allergic reaction to imiquimod, other medicines, foods, dyes, or preservatives -pregnant or trying to get pregnant -breast-feeding How should I use this medicine? This medicine is for external use only. Do not take by mouth. Follow the directions on the prescription label. Apply just before bedtime. Wash your hands before and after use. Apply a thin layer of cream and massage gently into the affected areas until no longer visible. Do not use in the mouth, eyes or the vagina. Use this medicine only on the affected area as directed by your health care provider. Do not use  for longer than prescribed. It is important not to use more medicine than prescribed. To do so may increase the chance of side effects. Talk to your pediatrician regarding the use of this medicine in children. While this drug may be prescribed for children as young as 37 years of age for selected conditions, precautions do apply. Overdosage: If you think you have taken too much of this medicine contact a poison control center or  emergency room at once. NOTE: This medicine is only for you. Do not share this medicine with others. What if I miss a dose? If you miss a dose, use it as soon as you can. If it is almost time for your next dose, use only that dose. Do not use double or extra doses. What may interact with this medicine? Interactions are not expected. Do not use any other medicines on the treated area without asking your doctor or health care professional. This list may not describe all possible interactions. Give your health care provider a list of all the medicines, herbs, non-prescription drugs, or dietary supplements you use. Also tell them if you smoke, drink alcohol, or use illegal drugs. Some items may interact with your medicine. What should I watch for while using this medicine? Visit your health care professional for regular checks on your progress. Do not use this medicine until the skin has healed from any other drug (example: podofilox or podophyllin resin) or surgical skin treatment. Females should receive regular pelvic exams while being treated for genital warts. Most patients see improvement within 4 weeks. It may take up to 16 weeks to see a full clearing of the warts. This medicine is not a cure. New warts may develop during or after treatment. Avoid sexual (genital, anal, oral) contact while the cream is on the skin. If warts are visible in the genital area, sexual contact should be avoided until the warts are treated. The use of latex condoms during sexual contact may reduce, but not entirely prevent, infecting others. This medicine may weaken condoms, diaphragms, cervical caps or other barrier devices and make them less effective as birth control. Do not cover the treated area with an airtight bandage. Cotton gauze dressings can be used. Cotton underwear can be worn after using this medicine on the genital or anal area. Actinic keratoses that were not seen before may appear during treatment and may  later go away. The treatment area and surrounding area may lighten or darken after treatment with this medicine. These skin color changes may be permanent in some patients. If you experience a skin reaction at the treatment site that interferes or prevents you from doing any daily activity, contact your health care provider. You may need a rest period from treatment. Treatment may be restarted once the reaction has gotten better as recommended by your doctor or health care professional. This medicine can make you more sensitive to the sun. Keep out of the sun. If you cannot avoid being in the sun, wear protective clothing and use sunscreen. Do not use sun lamps or tanning beds/booths. What side effects may I notice from receiving this medicine? Side effects that you should report to your doctor or health care professional as soon as possible: -open sores with or without drainage -skin infection -skin rash -unusual or severe skin reaction Side effects that usually do not require medical attention (report to your doctor or health care professional if they continue or are bothersome): -burning or itching -redness of the skin (very common  but is usually not painful or harmful) -scabbing, crusting, or peeling skin -skin that becomes hard or thickened -swelling of the skin This list may not describe all possible side effects. Call your doctor for medical advice about side effects. You may report side effects to FDA at 1-800-FDA-1088. Where should I keep my medicine? Keep out of the reach of children. Store between 4 and 25 degrees C (39 and 77 degrees F). Do not freeze. Throw away any unused medicine after the expiration date. Discard packet after applying to affected area. Partial packets should not be saved or reused. NOTE: This sheet is a summary. It may not cover all possible information. If you have questions about this medicine, talk to your doctor, pharmacist, or health care provider.  2018  Elsevier/Gold Standard (2008-09-17 10:33:25)

## 2017-02-24 NOTE — Progress Notes (Signed)
[name], a 66 year old female presents for a routine gynecological examination. She has not had a pap smear in many years. She has a history of genital warts. She had warts cauterized. She endorses vaginal itching. She is not sexually active.    Gynecologic Exam  The patient's primary symptoms include genital itching. The patient's pertinent negatives include no genital odor, genital rash, missed menses, pelvic pain, vaginal bleeding or vaginal discharge. This is a chronic problem. The patient is experiencing no pain. Pertinent negatives include no abdominal pain, anorexia, back pain, chills, constipation, diarrhea, discolored urine, dysuria, fever, flank pain, frequency, headaches, hematuria, joint pain, joint swelling, nausea, painful intercourse, rash, sore throat, urgency or vomiting. She is not sexually active. Her past medical history is significant for a Cesarean section and a gynecological surgery.   Past Medical History:  Diagnosis Date  . Perianal condylomata    Social History   Social History  . Marital status: Single    Spouse name: N/A  . Number of children: N/A  . Years of education: N/A   Occupational History  . Not on file.   Social History Main Topics  . Smoking status: Never Smoker  . Smokeless tobacco: Never Used  . Alcohol use No  . Drug use: No  . Sexual activity: Not on file   Other Topics Concern  . Not on file   Social History Narrative  . No narrative on file   Immunization History  Administered Date(s) Administered  . Tdap 11/23/2016    Review of Systems  Constitutional: Negative for chills and fever.  HENT: Negative for sore throat.   Eyes: Negative.   Respiratory: Negative.   Cardiovascular: Negative.   Gastrointestinal: Negative.  Negative for abdominal pain, anorexia, constipation, diarrhea, nausea and vomiting.  Genitourinary: Negative.  Negative for dysuria, flank pain, frequency, hematuria, missed menses, pelvic pain, urgency  and vaginal discharge.  Musculoskeletal: Negative for back pain and joint pain.  Skin: Negative for rash.  Neurological: Negative.  Negative for headaches.  Endo/Heme/Allergies: Negative.   Psychiatric/Behavioral: Negative.   Physical Exam  Constitutional: She is oriented to person, place, and time and well-developed, well-nourished, and in no distress.  HENT:  Head: Normocephalic and atraumatic.  Right Ear: External ear normal.  Eyes: Pupils are equal, round, and reactive to light.  Neck: Normal range of motion. Neck supple.  Pulmonary/Chest: Effort normal.  Abdominal: Soft.  Genitourinary: Rectum normal, uterus normal, cervix normal, right adnexa normal and left adnexa normal. Vulva exhibits lesion. Vagina exhibits abnormal mucosa, exudate, lesion and rugosity. Vaginal discharge found.  Genitourinary Comments: Wart, pearl colored, raised, hard, 0.5 cm.   Mild prolapse noted.   Musculoskeletal: Normal range of motion.  Neurological: She is alert and oriented to person, place, and time.     Plan   1. Pap smear, as part of routine gynecological examination Reviewed mammogram, negative. Mild prolapse, will do pelvic exam in 6 months.  - Cytology - PAP [facility]  2. 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 Wash with mild soap for a total of 16 weeks. Will follow up in 8 weeks.  - imiquimod (ALDARA) 5 % cream; Apply topically 3 (three) times a week.  Dispense: 24 each; Refill: 1   RTC: 8 weeks for genital warts   Nolon NationsLaChina Moore Hollis  MSN, FNP-C [facility] [address]  [place], [place] [address] [phone]

## 2017-02-25 LAB — POCT URINALYSIS DIP (DEVICE)
Bilirubin Urine: NEGATIVE
Glucose, UA: NEGATIVE mg/dL
HGB URINE DIPSTICK: NEGATIVE
KETONES UR: NEGATIVE mg/dL
Nitrite: NEGATIVE
PH: 5.5 (ref 5.0–8.0)
Protein, ur: NEGATIVE mg/dL
Specific Gravity, Urine: 1.025 (ref 1.005–1.030)
Urobilinogen, UA: 0.2 mg/dL (ref 0.0–1.0)

## 2017-02-28 LAB — CYTOLOGY - PAP
Bacterial vaginitis: POSITIVE — AB
Candida vaginitis: POSITIVE — AB
Chlamydia: NEGATIVE
Diagnosis: NEGATIVE
Neisseria Gonorrhea: NEGATIVE

## 2017-03-01 ENCOUNTER — Other Ambulatory Visit: Payer: Self-pay | Admitting: Family Medicine

## 2017-03-01 DIAGNOSIS — B373 Candidiasis of vulva and vagina: Secondary | ICD-10-CM

## 2017-03-01 DIAGNOSIS — B9689 Other specified bacterial agents as the cause of diseases classified elsewhere: Secondary | ICD-10-CM

## 2017-03-01 DIAGNOSIS — N760 Acute vaginitis: Secondary | ICD-10-CM

## 2017-03-01 DIAGNOSIS — B3731 Acute candidiasis of vulva and vagina: Secondary | ICD-10-CM

## 2017-03-01 MED ORDER — FLUCONAZOLE 150 MG PO TABS: 150.0000 mg | ORAL_TABLET | Freq: Once | ORAL | 0 refills | Status: AC

## 2017-03-01 MED ORDER — METRONIDAZOLE 500 MG PO TABS: 500.0000 mg | ORAL_TABLET | Freq: Two times a day (BID) | ORAL | 0 refills | Status: DC

## 2017-03-02 ENCOUNTER — Telehealth: Payer: Self-pay

## 2017-03-02 NOTE — Progress Notes (Signed)
Called and spoke with patient, advised of pap smear negative for pre cancerous cells but positive for yeast and bacterial vaginitis. Advised that patient would need to take metronidazole 500mg  twice daily for 7 days for bacterial vaginitis and take diflucan 150mg  once for yeast. Advised that these medications have been sent into pharmacy. Patient verbalized understanding and states that she will avoid alcohol while on metronidazole. Thanks!

## 2017-03-02 NOTE — Progress Notes (Signed)
Called, no answer. Left message for patient to return call. Thanks!  

## 2017-03-02 NOTE — Telephone Encounter (Signed)
Returned call. See previous message. Thanks!

## 2017-03-03 ENCOUNTER — Ambulatory Visit (INDEPENDENT_AMBULATORY_CARE_PROVIDER_SITE_OTHER): Payer: BLUE CROSS/BLUE SHIELD | Admitting: Orthopaedic Surgery

## 2017-03-03 DIAGNOSIS — M25562 Pain in left knee: Secondary | ICD-10-CM

## 2017-03-03 DIAGNOSIS — G8929 Other chronic pain: Secondary | ICD-10-CM

## 2017-03-03 NOTE — Progress Notes (Signed)
The patient is here today for scheduled hyaluronic acid or Synvisc 1 injection to her left knee. We did this to her right knee a month ago said that's doing well.  On examination of her left knee she has mild varus deformity with good range of motion overall with no significant effusion the deathly arthritic changes and pain.  I placed the Synvisc 1 injection in her left knee without difficulty. We'll see her back in 2 months to see her knees are doing overall. At that visit we can always place steroid injections if needed.

## 2017-03-18 ENCOUNTER — Other Ambulatory Visit: Payer: Self-pay | Admitting: Family Medicine

## 2017-03-18 DIAGNOSIS — E785 Hyperlipidemia, unspecified: Secondary | ICD-10-CM

## 2017-04-21 ENCOUNTER — Ambulatory Visit: Payer: BLUE CROSS/BLUE SHIELD | Admitting: Family Medicine

## 2017-05-05 ENCOUNTER — Encounter (INDEPENDENT_AMBULATORY_CARE_PROVIDER_SITE_OTHER): Payer: Self-pay | Admitting: Orthopaedic Surgery

## 2017-05-05 ENCOUNTER — Ambulatory Visit (INDEPENDENT_AMBULATORY_CARE_PROVIDER_SITE_OTHER): Payer: Medicare Other | Admitting: Orthopaedic Surgery

## 2017-05-05 DIAGNOSIS — M1711 Unilateral primary osteoarthritis, right knee: Secondary | ICD-10-CM

## 2017-05-05 DIAGNOSIS — M25562 Pain in left knee: Secondary | ICD-10-CM

## 2017-05-05 DIAGNOSIS — M1712 Unilateral primary osteoarthritis, left knee: Secondary | ICD-10-CM

## 2017-05-05 DIAGNOSIS — M25561 Pain in right knee: Secondary | ICD-10-CM

## 2017-05-05 DIAGNOSIS — G8929 Other chronic pain: Secondary | ICD-10-CM | POA: Insufficient documentation

## 2017-05-05 HISTORY — DX: Unilateral primary osteoarthritis, left knee: M17.12

## 2017-05-05 MED ORDER — METHYLPREDNISOLONE ACETATE 40 MG/ML IJ SUSP
40.0000 mg | INTRAMUSCULAR | Status: AC | PRN
  Administered 2017-05-05: 40 mg via INTRA_ARTICULAR

## 2017-05-05 MED ORDER — LIDOCAINE HCL 1 % IJ SOLN
3.0000 mL | INTRAMUSCULAR | Status: AC | PRN
  Administered 2017-05-05: 3 mL

## 2017-05-05 NOTE — Progress Notes (Signed)
Office Visit Note   Patient: Hailey Young           Date of Birth: Oct 23, 1950           MRN: [id number] Visit Date: 05/05/2017              Requested by: Massie MaroonHollis, Lachina M, FNP [address] [place], [place] [address] PCP: Massie MaroonHollis, Lachina M, FNP   Assessment & Plan: Visit Diagnoses:  1. Chronic pain of left knee   2. Chronic pain of right knee   3. Unilateral primary osteoarthritis, left knee   4. Unilateral primary osteoarthritis, right knee     Plan: We talked about steroid injections in both knees as well as the risk and benefits of these injections. Both knees were prepped and draped Betadine and alcohol and then we provided steroid injections without difficulty. She no she can have injections again in 3 months from now. She has no pain she will call us.  Follow-Up Instructions: Return if symptoms worsen or fail to improve.   Orders:  Orders Placed This Encounter  Procedures  . Large Joint Injection/Arthrocentesis  . Large Joint Injection/Arthrocentesis   No orders of the defined types were placed in this encounter.     Procedures: Large Joint Inj Date/Time: 05/05/2017 1:51 PM Performed by: Kathryne HitchBLACKMAN, Maeve Debord Y Authorized by: Doneen PoissonBLACKMAN, Viridiana Spaid Y   Location:  Knee Site:  R knee Ultrasound Guidance: No   Fluoroscopic Guidance: No   Arthrogram: No   Medications:  3 mL lidocaine 1 %; 40 mg methylPREDNISolone acetate 40 MG/ML Large Joint Inj Date/Time: 05/05/2017 1:51 PM Performed by: Kathryne HitchBLACKMAN, Shaneya Taketa Y Authorized by: Kathryne HitchBLACKMAN, Shervon Kerwin Y   Location:  Knee Site:  L knee Ultrasound Guidance: No   Fluoroscopic Guidance: No   Arthrogram: No   Medications:  3 mL lidocaine 1 %; 40 mg methylPREDNISolone acetate 40 MG/ML     Clinical Data: No additional findings.   Subjective: Chief Complaint  Patient presents with  . Follow-up    right knee  The patient is here today requesting bilateral steroid injections in her knees. She  does have chronic bilateral knee pain and chronic bilateral arthritis of both knees. We have provided Synvisc 1 injections in both knees will over a month ago. She still has daily pain but things are certainly improving.  HPI  Review of Systems  She denies any headache, chest pain, shortness of breath, fever, chills, nausea, vomiting. Objective: Vital Signs: There were no vitals taken for this visit.  Physical Exam She is alert and oriented 3 in no acute distress Ortho Exam Examination of both knee show no effusion but deathly patellofemoral crepitation and varus malalignment with good range of motion. Specialty Comments:  No specialty comments available.  Imaging: No results found.   PMFS History: Patient Active Problem List   Diagnosis Date Noted  . Chronic pain of left knee 05/05/2017  . Chronic pain of right knee 05/05/2017  . Unilateral primary osteoarthritis, left knee 05/05/2017  . Unilateral primary osteoarthritis, right knee 02/02/2017  . Elevated blood-pressure reading without diagnosis of hypertension 12/08/2016  . Obesity (BMI 30-39.9) 11/23/2016  . Metabolic syndrome 11/23/2016  . Arthritis 11/23/2016  . Anemia, iron deficiency 07/05/2015  . Obesity 07/05/2015  . Anemia 07/04/2015  . Symptomatic anemia 07/04/2015  . Hypokalemia 07/04/2015  . 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 Perianal condylomata     No family  history on file.  Past Surgical History:  Procedure Laterality Date  . ablation of perianal warts     Social History   Occupational History  . Not on file.   Social History Main Topics  . Smoking status: Never Smoker  . Smokeless tobacco: Never Used  . Alcohol use No  . Drug use: No  . Sexual activity: Not on file

## 2017-05-12 ENCOUNTER — Ambulatory Visit (INDEPENDENT_AMBULATORY_CARE_PROVIDER_SITE_OTHER): Payer: Medicare Other | Admitting: Family Medicine

## 2017-05-12 ENCOUNTER — Encounter: Payer: Self-pay | Admitting: Family Medicine

## 2017-05-12 VITALS — BP 140/88 | HR 82 | Temp 98.5°F | Resp 14 | Ht 67.0 in | Wt 191.0 lb

## 2017-05-12 DIAGNOSIS — A630 Anogenital (venereal) warts: Secondary | ICD-10-CM

## 2017-05-12 DIAGNOSIS — E785 Hyperlipidemia, unspecified: Secondary | ICD-10-CM

## 2017-05-12 NOTE — Progress Notes (Signed)
Subjective:     Hailey Young is a 66 y.o. female here for a follow up of condylomata acuminata. She underwent an ablation for perianal condylomata in 2010 and 2011. She was started on Imiquimod for 8 weeks. She complains of itching after applying medication. She says that she has not been applying medication 3 times per week as prescribed. She denies vaginal discharge or vaginal itching.   Past Medical History:  Diagnosis Date  . Perianal condylomata    Social History   Social History  . Marital status: Single    Spouse name: N/A  . Number of children: N/A  . Years of education: N/A   Occupational History  . Not on file.   Social History Main Topics  . Smoking status: Never Smoker  . Smokeless tobacco: Never Used  . Alcohol use No  . Drug use: No  . Sexual activity: Not on file   Other Topics Concern  . Not on file   Social History Narrative  . No narrative on file   Immunization History  Administered Date(s) Administered  . Tdap 11/23/2016   Review of Systems Review of Systems  Constitutional: Negative.  Negative for diaphoresis.  HENT: Negative.   Eyes: Negative.   Respiratory: Negative.  Negative for cough.   Cardiovascular: Negative.   Gastrointestinal: Negative.   Genitourinary: Negative.   Musculoskeletal: Negative.  Negative for myalgias.  Skin: Negative.   Neurological: Negative.  Negative for weakness.  Endo/Heme/Allergies: Negative.   Psychiatric/Behavioral: Negative.     Objective:   Physical Exam  Constitutional: She is oriented to person, place, and time and well-developed, well-nourished, and in no distress.  HENT:  Head: Normocephalic.  Eyes: EOM are normal.  Neck: Neck supple.  Cardiovascular: Normal rate, regular rhythm, normal heart sounds and intact distal pulses.   Pulmonary/Chest: Effort normal and breath sounds normal.  Abdominal: Soft. Bowel sounds are normal.  Genitourinary: Vagina exhibits abnormal mucosa.  Genitourinary  Comments: Scaring and hypopigmentation of perianal and vaginal tissue. No new warts visualized.   Musculoskeletal: Normal range of motion.  Neurological: She is alert and oriented to person, place, and time. Gait normal.  Skin: Skin is warm and dry.  Psychiatric: Mood, memory, affect and judgment normal.   Assessment:  BP 140/88 (BP Location: Left Arm, Patient Position: Sitting, Cuff Size: Normal) Comment (BP Location): left arm manually  Pulse 82   Temp 98.5 F (36.9 C) (Oral)   Resp 14   Ht 5\' 7"  (1.702 m)   Wt 191 lb (86.6 kg)   SpO2 96%   BMI 29.91 kg/m   Plan:   1. 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 Will follow up for a routine gynecological exam in  6 months.   2. Hyperlipidemia, unspecified hyperlipidemia type The 10-year ASCVD risk score Denman George(Goff DC Jr., et al., 2013) is: 11.4%   Values used to calculate the score:     Age: [age] years     Sex: Female     Is Non-Hispanic African American: Yes     Diabetic: No     Tobacco smoker: No     Systolic Blood Pressure: 140 mmHg     Is BP treated: No     HDL Cholesterol: 46 mg/dL     Total Cholesterol: 227 mg/dL  - Lipid Panel; Future   RTC: 3 months for hyperlipidemia   Nolon NationsLaChina Moore Tacy Chavis  MSN, FNP-C [facility] [address]  [place], [place] [address] [phone]

## 2017-05-12 NOTE — Patient Instructions (Signed)
Genital Warts Genital warts are small growths in the genital area or anal area. They are caused by a type of germ (HPV virus). This germ is spread from person to person during sex. It can be spread through vaginal, anal, and oral sex. Genital warts can lead to other problems if they are not treated. Follow these instructions at home: Medicines  Apply over-the-counter and prescription medicines only as told by your doctor.  Do not use medicines that are meant for treating hand warts.  Talk with your doctor about using anti-itch creams. General instructions  Do not touch or scratch the warts.  Do not have sex until your treatment is done.  Tell your current and past sexual partners about your condition. They may need treatment.  Keep all follow-up visits as told by your doctor. This is important.  After treatment, use condoms during sex. Other Instructions for Women  Women who have genital warts may need to be checked more often for cervical cancer.  If you become pregnant, tell your doctor that you have had genital warts. The germ can be passed to the baby. Contact a doctor if:  You have redness, swelling, or pain in the area of the treated skin.  You have a fever.  You feel generally sick.  You feel lumps in and around your genital area or anal area.  You have bleeding in your genital area or anal area.  You have pain during sex. This information is not intended to replace advice given to you by your health care provider. Make sure you discuss any questions you have with your health care provider. Document Released: 12/29/2009 Document Revised: 03/11/2016 Document Reviewed: 12/30/2014 Elsevier Interactive Patient Education  2018 Elsevier Inc.  

## 2017-05-20 ENCOUNTER — Other Ambulatory Visit (INDEPENDENT_AMBULATORY_CARE_PROVIDER_SITE_OTHER): Payer: Medicare Other

## 2017-05-20 ENCOUNTER — Encounter: Payer: Self-pay | Admitting: Family Medicine

## 2017-05-20 ENCOUNTER — Telehealth: Payer: Self-pay | Admitting: Family Medicine

## 2017-05-20 ENCOUNTER — Ambulatory Visit (INDEPENDENT_AMBULATORY_CARE_PROVIDER_SITE_OTHER): Payer: Medicare Other | Admitting: Family Medicine

## 2017-05-20 VITALS — BP 140/86 | HR 89 | Temp 98.3°F | Resp 14 | Ht 67.0 in | Wt 193.0 lb

## 2017-05-20 DIAGNOSIS — E785 Hyperlipidemia, unspecified: Secondary | ICD-10-CM | POA: Diagnosis not present

## 2017-05-20 DIAGNOSIS — F411 Generalized anxiety disorder: Secondary | ICD-10-CM | POA: Diagnosis not present

## 2017-05-20 NOTE — Progress Notes (Signed)
Subjective:     Hailey Young is a 66 y.o. female who presents complaining of  anxiety disorder. She has the following anxiety symptoms: difficulty concentrating and feelings of losing control. Onset of symptoms was  several years ago. Symptoms have been mild and intermittent  since that time. She was previously on pet therapy, which helped with symptoms. She maintains that she constantly worries about her children. Her son has special needs and she is his primary caregiver.  She denies current suicidal and homicidal ideation. She has never been on anti-anxiety medications, she has a fear of taking medications.    Past Medical History:  Diagnosis Date  . Perianal condylomata    Social History   Social History  . Marital status: Single    Spouse name: N/A  . Number of children: N/A  . Years of education: N/A   Occupational History  . Not on file.   Social History Main Topics  . Smoking status: Never Smoker  . Smokeless tobacco: Never Used  . Alcohol use No  . Drug use: No  . Sexual activity: Not on file   Other Topics Concern  . Not on file   Social History Narrative  . No narrative on file    GAD 7 : Generalized Anxiety Score 05/20/2017  Nervous, Anxious, on Edge 1  Control/stop worrying 1  Worry too much - different things 1  Trouble relaxing 1  Restless 1  Easily annoyed or irritable 2  Afraid - awful might happen 2  Total GAD 7 Score 9  Anxiety Difficulty Somewhat difficult    Review of Systems Review of Systems  Constitutional: Negative.   HENT: Negative.   Eyes: Negative.   Respiratory: Negative.   Cardiovascular: Negative.   Gastrointestinal: Negative.   Genitourinary: Negative.   Musculoskeletal: Negative.   Skin: Negative.   Neurological: Negative.   Endo/Heme/Allergies: Negative.   Psychiatric/Behavioral: The patient is nervous/anxious.     Objective:    BP 140/86 (BP Location: Right Arm, Patient Position: Sitting, Cuff Size: Normal) Comment:  manually  Pulse 89   Temp 98.3 F (36.8 C) (Oral)   Resp 14   Ht 5\' 7"  (1.702 m)   Wt 193 lb (87.5 kg)   SpO2 97%   BMI 30.23 kg/m   Physical Exam    BP 140/86 (BP Location: Right Arm, Patient Position: Sitting, Cuff Size: Normal) Comment: manually  Pulse 89   Temp 98.3 F (36.8 C) (Oral)   Resp 14   Ht 5\' 7"  (1.702 m)   Wt 193 lb (87.5 kg)   SpO2 97%   BMI 30.23 kg/m   General Appearance:    Alert, cooperative, no distress, appears stated age  Head:    Normocephalic, without obvious abnormality, atraumatic  Eyes:    PERRL, conjunctiva/corneas clear, EOM's intact, fundi    benign, both eyes  Back:     Symmetric, no curvature, ROM normal, no CVA tenderness  Lungs:     Clear to auscultation bilaterally, respirations unlabored  Chest Wall:    No tenderness or deformity   Heart:    Regular rate and rhythm, S1 and S2 normal, no murmur, rub   or gallop  Abdomen:     Soft, non-tender, bowel sounds active all four quadrants,    no masses, no organomegaly  Extremities:   Extremities normal, atraumatic, no cyanosis or edema  Pulses:   2+ and symmetric all extremities  Skin:   Skin color, texture, turgor normal, no  rashes or lesions  Lymph nodes:   Cervical, supraclavicular, and axillary nodes normal  Neurologic:   CNII-XII intact, normal strength, sensation and reflexes    Throughout Affect normal limits Judgement nl Mood nl     Assessment:    Plan:  1. Generalized anxiety disorder Recommend that patient continues pet therapy She is not interested in started oral medications at this time Will send a referral to psychology for further evaluation - Ambulatory referral to Psychology   Nolon NationsLaChina Moore Hollis  MSN, FNP-C [facility] [address]  [place], [place] [id number] [phone]

## 2017-05-20 NOTE — Patient Instructions (Signed)

## 2017-05-21 LAB — LIPID PANEL
CHOLESTEROL: 185 mg/dL (ref <200)
HDL: 73 mg/dL (ref >50)
LDL CALC: 95 mg/dL (ref <100)
TRIGLYCERIDES: 86 mg/dL (ref <150)
Total CHOL/HDL Ratio: 2.5 Ratio (ref <5.0)
VLDL: 17 mg/dL (ref <30)

## 2017-05-23 ENCOUNTER — Telehealth: Payer: Self-pay

## 2017-05-23 NOTE — Telephone Encounter (Signed)
-----   Message from Massie MaroonLachina M Hollis, [place] sent at 05/21/2017  6:58 AM EDT ----- Regarding: lab results  Please inform patient that cholesterol has improved on current medication regimen, no changes warranted. Recommend a lowfat, low carbohydrate diet divided over 5-6 small meals, increase water intake to 6-8 glasses, and 150 minutes per week of cardiovascular exercise.   Will follow up as scheduled.   Thanks ----- Message ----- From: Interface, Lab In [facility] Sent: 05/21/2017  12:49 AM To: Massie MaroonLachina M Hollis, FNP

## 2017-05-23 NOTE — Telephone Encounter (Signed)
Called and spoke with patient, advised of improved cholesterol and the need to continue medication regiment as previously prescribed. Advised patient to continue working on eating a low fat/low cholesterol diet over 5 to 6 small meals daily, advised to drink 6 to 8 glasses of water daily, and exercise 150 minutes weekly of cardio. Patient verbalized understanding. Thanks!

## 2017-06-29 ENCOUNTER — Ambulatory Visit (INDEPENDENT_AMBULATORY_CARE_PROVIDER_SITE_OTHER): Payer: Medicare Other | Admitting: Orthopaedic Surgery

## 2017-06-29 DIAGNOSIS — M1712 Unilateral primary osteoarthritis, left knee: Secondary | ICD-10-CM

## 2017-06-29 DIAGNOSIS — M1711 Unilateral primary osteoarthritis, right knee: Secondary | ICD-10-CM

## 2017-06-29 NOTE — Progress Notes (Signed)
Hailey Young is a 66 year old with known bilateral knee arthritis arthritis of the right worsen left. She had steroid injections in her knees less than 2 months ago. She says the injection did great for about a month of worn off.  On exam she has varus malalignment of her knees with the right worse than left. She has global tenderness and patellofemoral crepitation with great range of motion. She does wear a knee sleeve on both knees.  The only other recommendation we consider knee replacement surgery which she is not in favor of. We can place steroid injections in her knees again in 4 weeks from now when she is in favor of. We'll see her then.

## 2017-07-27 ENCOUNTER — Ambulatory Visit (INDEPENDENT_AMBULATORY_CARE_PROVIDER_SITE_OTHER): Payer: Medicare Other | Admitting: Physician Assistant

## 2017-07-27 DIAGNOSIS — M1711 Unilateral primary osteoarthritis, right knee: Secondary | ICD-10-CM

## 2017-07-27 DIAGNOSIS — M1712 Unilateral primary osteoarthritis, left knee: Secondary | ICD-10-CM | POA: Diagnosis not present

## 2017-07-27 DIAGNOSIS — M170 Bilateral primary osteoarthritis of knee: Secondary | ICD-10-CM

## 2017-07-27 MED ORDER — METHYLPREDNISOLONE ACETATE 40 MG/ML IJ SUSP
40.0000 mg | INTRAMUSCULAR | Status: AC | PRN
  Administered 2017-07-27: 40 mg via INTRA_ARTICULAR

## 2017-07-27 MED ORDER — LIDOCAINE HCL 1 % IJ SOLN
3.0000 mL | INTRAMUSCULAR | Status: AC | PRN
  Administered 2017-07-27: 3 mL

## 2017-07-27 NOTE — Progress Notes (Signed)
   Procedure Note  Patient: Hailey Young             Date of Birth: 1951/05/24           MRN: [id number]             Visit Date: 07/27/2017 History of present illness: Mrs. Hailey Young is 66 year old female known to Dr. Eliberto Ivory service with bilateral knee arthritis. Right greater than left. She comes in today requesting injections in both knees.  Physical exam: Bilateral knee she has good range of motion of both knees. Some slight crepitus of both knees left greater than right. No effusion abnormal warmth or erythema. Procedures: Visit Diagnoses: Primary osteoarthritis of both knees  Large Joint Inj Date/Time: 07/27/2017 2:43 PM Performed by: Kirtland Bouchard Authorized by: Kirtland Bouchard   Consent Given by:  Patient Indications:  Pain Location:  Knee Site:  R knee Needle Size:  22 G Approach:  Anterolateral Ultrasound Guidance: No   Fluoroscopic Guidance: No   Medications:  40 mg methylPREDNISolone acetate 40 MG/ML; 3 mL lidocaine 1 % Aspiration Attempted: No   Patient tolerance:  Patient tolerated the procedure well with no immediate complications Large Joint Inj Date/Time: 07/27/2017 2:43 PM Performed by: Kirtland Bouchard Authorized by: Kirtland Bouchard   Consent Given by:  Patient Indications:  Pain Location:  Knee Site:  L knee Needle Size:  22 G Approach:  Anterolateral Ultrasound Guidance: No   Fluoroscopic Guidance: No   Medications:  40 mg methylPREDNISolone acetate 40 MG/ML; 3 mL lidocaine 1 % Aspiration Attempted: No   Patient tolerance:  Patient tolerated the procedure well with no immediate complications   Plan: She'll follow up with [place] on an as-needed basis. She understands she can have injections as frequent as every 3 months for her knees with cortisone. She can have supplemental injections every 6 months as needed.

## 2017-08-12 ENCOUNTER — Ambulatory Visit: Payer: Medicare Other | Admitting: Family Medicine

## 2017-08-19 ENCOUNTER — Encounter: Payer: Self-pay | Admitting: Family Medicine

## 2017-08-19 ENCOUNTER — Ambulatory Visit (INDEPENDENT_AMBULATORY_CARE_PROVIDER_SITE_OTHER): Payer: Medicare Other | Admitting: Family Medicine

## 2017-08-19 VITALS — BP 140/90 | HR 76 | Temp 98.6°F | Resp 14 | Ht 67.0 in | Wt 193.0 lb

## 2017-08-19 DIAGNOSIS — E7849 Other hyperlipidemia: Secondary | ICD-10-CM | POA: Diagnosis not present

## 2017-08-19 DIAGNOSIS — A630 Anogenital (venereal) warts: Secondary | ICD-10-CM

## 2017-08-19 NOTE — Progress Notes (Signed)
Hailey Young is a 66 year old female that presents for follow-up of condylomata acuminata.  Patient has a long history of this condition and underwent an ablation for perianal condylomata in 2010 and 2011.  She continues to have perianal itching and genital warts.  Patient has significant scarring from previous ablation.  She was previously started on imiquimod for 8 weeks, she complained of intense itching after applying medication.  She was nonadherent to regimen and discontinued use.  She is not been sexually active for many years.  She is very concerned about her overall appearance of genital warts and periodic itching. Past Medical History:  Diagnosis Date  . Perianal condylomata    Social History   Socioeconomic History  . Marital status: Single    Spouse name: Not on file  . Number of children: Not on file  . Years of education: Not on file  . Highest education level: Not on file  Social Needs  . Financial resource strain: Not on file  . Food insecurity - worry: Not on file  . Food insecurity - inability: Not on file  . Transportation needs - medical: Not on file  . Transportation needs - non-medical: Not on file  Occupational History  . Not on file  Tobacco Use  . Smoking status: Never Smoker  . Smokeless tobacco: Never Used  Substance and Sexual Activity  . Alcohol use: No  . Drug use: No  . Sexual activity: Not on file  Other Topics Concern  . Not on file  Social History Narrative  . Not on file   Immunization History  Administered Date(s) Administered  . Tdap 11/23/2016  Review of Systems  Constitutional: Negative.   HENT: Negative.   Eyes: Negative.   Respiratory: Negative.   Cardiovascular: Negative.   Gastrointestinal: Negative.   Genitourinary: Negative.  Negative for dysuria, flank pain, frequency, hematuria and urgency.  Musculoskeletal: Negative.   Neurological: Negative.   Endo/Heme/Allergies: Negative.   Psychiatric/Behavioral: Negative.      Physical Exam  Constitutional: She is oriented to person, place, and time. She appears well-developed and well-nourished.  HENT:  Head: Normocephalic and atraumatic.  Neck: Normal range of motion. Neck supple.  Cardiovascular: Normal rate and regular rhythm.  Pulmonary/Chest: Effort normal and breath sounds normal.  Abdominal: Soft. Bowel sounds are normal.  Musculoskeletal: Normal range of motion.  Neurological: She is oriented to person, place, and time.  Skin: Skin is warm and dry.  Psychiatric: She has a normal mood and affect. Her behavior is normal. Judgment and thought content normal.    Plan    1. Perianal condylomata s/p ablation 11/2009, 04/2009 We will send a referral to gynecology for further workup and evaluation.  Patient can also discuss other treatment options. Discussed with patient that scarring will most likely not dissipate Refrain from applying over-the-counter gels and ointments unless recommended by gynecology - Ambulatory referral to Gynecology  2. Other hyperlipidemia We will continue statin and ASA therapy.  Will repeat fasting lipid panel in 3 months. The 10-year ASCVD risk score Denman George(Goff DC Montez HagemanJr., et al., 2013) is: 8.5%   Values used to calculate the score:     Age: [age] years     Sex: Female     Is Non-Hispanic African American: Yes     Diabetic: No     Tobacco smoker: No     Systolic Blood Pressure: 140 mmHg     Is BP treated: No     HDL Cholesterol: 73 mg/dL  Total Cholesterol: 185 mg/dL  - Lipid Panel; Future   RTC: Patient to follow-up in office as needed. Follow-up in 3 months for fasting lipid panel    Nolon Nations  MSN, FNP-C [facility] [address] [place], [place] [address] [phone]

## 2017-08-19 NOTE — Patient Instructions (Addendum)
Patient has a history of perianal condylomata we will send a referral to gynecology for consult.  We will hold Aldara for right now.  Continue to wear loosefitting clothing while at home.  Refrain from applying over-the-counter creams.  I have sent in a refill for atorvastatin.  Continue a low-fat, low-sodium diet divided over 5-6 small meals and increase daily water intake.  Also increase daily activity level Genital Warts Genital warts are small growths in the genital area or anal area. They are caused by a type of germ (HPV virus). This germ is spread from person to person during sex. It can be spread through vaginal, anal, and oral sex. Genital warts can lead to other problems if they are not treated. Follow these instructions at home: Medicines  Apply over-the-counter and prescription medicines only as told by your doctor.  Do not use medicines that are meant for treating hand warts.  Talk with your doctor about using anti-itch creams. General instructions  Do not touch or scratch the warts.  Do not have sex until your treatment is done.  Tell your current and past sexual partners about your condition. They may need treatment.  Keep all follow-up visits as told by your doctor. This is important.  After treatment, use condoms during sex. Other Instructions for Women  Women who have genital warts may need to be checked more often for cervical cancer.  If you become pregnant, tell your doctor that you have had genital warts. The germ can be passed to the baby. Contact a doctor if:  You have redness, swelling, or pain in the area of the treated skin.  You have a fever.  You feel generally sick.  You feel lumps in and around your genital area or anal area.  You have bleeding in your genital area or anal area.  You have pain during sex. This information is not intended to replace advice given to you by your health care provider. Make sure you discuss any questions you have  with your health care provider. Document Released: 12/29/2009 Document Revised: 03/11/2016 Document Reviewed: 12/30/2014 Elsevier Interactive Patient Education  [employer]2018 Else[employer] Inc.

## 2017-09-16 ENCOUNTER — Other Ambulatory Visit: Payer: Self-pay | Admitting: Family Medicine

## 2017-09-16 DIAGNOSIS — E785 Hyperlipidemia, unspecified: Secondary | ICD-10-CM

## 2017-11-18 ENCOUNTER — Other Ambulatory Visit: Payer: Medicare Other

## 2017-11-18 DIAGNOSIS — E7849 Other hyperlipidemia: Secondary | ICD-10-CM

## 2017-11-19 LAB — LIPID PANEL
CHOL/HDL RATIO: 3.3 ratio (ref 0.0–4.4)
CHOLESTEROL TOTAL: 159 mg/dL (ref 100–199)
HDL: 48 mg/dL (ref >39)
LDL Calculated: 78 mg/dL (ref 0–99)
TRIGLYCERIDES: 167 mg/dL — AB (ref 0–149)
VLDL Cholesterol Cal: 33 mg/dL (ref 5–40)

## 2017-11-21 ENCOUNTER — Telehealth: Payer: Self-pay

## 2017-11-21 NOTE — Telephone Encounter (Signed)
Informed patient that overall cholesterol has improved and continue to take atorvastatin as prescribed. Asked that she continue to eat a low fat/ low carb diet over 5 to 6 small meals daily, asked that she drink 6 to 8 glasses of water daily, and exercise 150 minutes weekly of cardio. Patient verbalized understanding and was encouraged to keep next scheduled appointment. Thanks!

## 2017-11-21 NOTE — Telephone Encounter (Signed)
-----   Message from Massie MaroonLachina M Hollis, [place] sent at 11/19/2017  7:40 AM EST ----- Regarding: lab results Please inform patient that overall cholesterol has improved. Triglycerides remain slightly elevated, will continue atorvastatin as prescribed.   Recommend a lowfat, low carbohydrate diet divided over 5-6 small meals, increase water intake to 6-8 glasses, and 150 minutes per week of cardiovascular exercise.    Thanks

## 2017-12-19 ENCOUNTER — Other Ambulatory Visit: Payer: Self-pay | Admitting: Family Medicine

## 2017-12-19 DIAGNOSIS — E785 Hyperlipidemia, unspecified: Secondary | ICD-10-CM

## 2017-12-26 ENCOUNTER — Encounter (INDEPENDENT_AMBULATORY_CARE_PROVIDER_SITE_OTHER): Payer: Self-pay | Admitting: Physician Assistant

## 2017-12-26 ENCOUNTER — Ambulatory Visit (INDEPENDENT_AMBULATORY_CARE_PROVIDER_SITE_OTHER): Payer: Medicare Other

## 2017-12-26 ENCOUNTER — Ambulatory Visit (INDEPENDENT_AMBULATORY_CARE_PROVIDER_SITE_OTHER): Payer: Medicare Other | Admitting: Physician Assistant

## 2017-12-26 DIAGNOSIS — M25561 Pain in right knee: Secondary | ICD-10-CM | POA: Diagnosis not present

## 2017-12-26 DIAGNOSIS — G8929 Other chronic pain: Secondary | ICD-10-CM

## 2017-12-26 DIAGNOSIS — M25562 Pain in left knee: Secondary | ICD-10-CM

## 2017-12-26 MED ORDER — LIDOCAINE HCL 1 % IJ SOLN
0.5000 mL | INTRAMUSCULAR | Status: AC | PRN
  Administered 2017-12-26: .5 mL

## 2017-12-26 MED ORDER — METHYLPREDNISOLONE ACETATE 40 MG/ML IJ SUSP
40.0000 mg | INTRAMUSCULAR | Status: AC | PRN
  Administered 2017-12-26: 40 mg via INTRA_ARTICULAR

## 2017-12-26 NOTE — Progress Notes (Signed)
   Procedure Note  Patient: Hailey Young             Date of Birth: 06-11-1951           MRN: [id number]             Visit Date: 12/26/2017  HPI: Hailey Young is well-known to Dr. Magnus IvanBlackman service comes in today with bilateral knee pain.  Last office visit was 07/27/2017 and she was given cortisone injections both knees she is done well until just recently.  She has pain in both knees.  She is taking some ibuprofen which helps with her knees.  She is also wearing a pullover knee sleeves which help due to the compression.  She denies any catching locking or giving way of either knee.  No new injuries to either knee. Procedures: Visit Diagnoses: Chronic pain of right knee - Plan: XR Knee 1-2 Views Right  Chronic pain of left knee - Plan: XR Knee 1-2 Views Left  Large Joint Inj: bilateral knee on 12/26/2017 1:36 PM Indications: pain Details: 22 G 1.5 in needle, anterolateral approach  Arthrogram: No  Medications (Right): 0.5 mL lidocaine 1 %; 40 mg methylPREDNISolone acetate 40 MG/ML Medications (Left): 0.5 mL lidocaine 1 %; 40 mg methylPREDNISolone acetate 40 MG/ML Outcome: tolerated well, no immediate complications Procedure, treatment alternatives, risks and benefits explained, specific risks discussed. Consent was given by the patient. Immediately prior to procedure a time out was called to verify the correct patient, procedure, equipment, support staff and site/side marked as required. Patient was prepped and draped in the usual sterile fashion.     Plan: She will follow-up on an as-needed basis no sooner than 3 months from now for 4 days and injections.  Continue to  work on Dance movement psychotherapistquad strengthening.

## 2018-02-16 ENCOUNTER — Ambulatory Visit: Payer: Medicare Other | Admitting: Family Medicine

## 2018-03-09 ENCOUNTER — Encounter: Payer: Self-pay | Admitting: Family Medicine

## 2018-03-09 ENCOUNTER — Ambulatory Visit (INDEPENDENT_AMBULATORY_CARE_PROVIDER_SITE_OTHER): Payer: Medicare Other | Admitting: Family Medicine

## 2018-03-09 VITALS — BP 146/94 | HR 96 | Temp 98.9°F | Resp 16 | Ht 67.0 in | Wt 190.0 lb

## 2018-03-09 DIAGNOSIS — D649 Anemia, unspecified: Secondary | ICD-10-CM

## 2018-03-09 DIAGNOSIS — Z23 Encounter for immunization: Secondary | ICD-10-CM

## 2018-03-09 DIAGNOSIS — E785 Hyperlipidemia, unspecified: Secondary | ICD-10-CM | POA: Diagnosis not present

## 2018-03-09 DIAGNOSIS — Z1211 Encounter for screening for malignant neoplasm of colon: Secondary | ICD-10-CM

## 2018-03-09 DIAGNOSIS — I10 Essential (primary) hypertension: Secondary | ICD-10-CM | POA: Diagnosis not present

## 2018-03-09 MED ORDER — ATORVASTATIN CALCIUM 20 MG PO TABS: 20.0000 mg | ORAL_TABLET | Freq: Every day | ORAL | 1 refills | Status: DC

## 2018-03-09 MED ORDER — HYDROCHLOROTHIAZIDE 12.5 MG PO TABS: 12.5000 mg | ORAL_TABLET | Freq: Every day | ORAL | 0 refills | Status: DC

## 2018-03-09 NOTE — Patient Instructions (Addendum)
Blood pressure has been consistently elevated, will start a trial of hydrochlorothiazide 12.5 mg daily.  Recommend a low-fat, low-sodium diet divided over 5-6 small meals.  Return in 1 week for blood pressure check and fasting cholesterol panel.  We will schedule follow-up appointment in 6 months.   Food Choices to Lower Your Triglycerides Triglycerides are a type of fat in your blood. High levels of triglycerides can increase the risk of heart disease and stroke. If your triglyceride levels are high, the foods you eat and your eating habits are very important. Choosing the right foods can help lower your triglycerides. What general guidelines do I need to follow?  Lose weight if you are overweight.  Limit or avoid alcohol.  Fill one half of your plate with vegetables and green salads.  Limit fruit to two servings a day. Choose fruit instead of juice.  Make one fourth of your plate whole grains. Look for the word "whole" as the first word in the ingredient list.  Fill one fourth of your plate with lean protein foods.  Enjoy fatty fish (such as salmon, mackerel, sardines, and tuna) three times a week.  Choose healthy fats.  Limit foods high in starch and sugar.  Eat more home-cooked food and less restaurant, buffet, and fast food.  Limit fried foods.  Cook foods using methods other than frying.  Limit saturated fats.  Check ingredient lists to avoid foods with partially hydrogenated oils (trans fats) in them. What foods can I eat? Grains Whole grains, such as whole wheat or whole grain breads, crackers, cereals, and pasta. Unsweetened oatmeal, bulgur, barley, quinoa, or brown rice. Corn or whole wheat flour tortillas. Vegetables Fresh or frozen vegetables (raw, steamed, roasted, or grilled). Green salads. Fruits All fresh, canned (in natural juice), or frozen fruits. Meat and Other Protein Products Ground beef (85% or leaner), grass-fed beef, or beef trimmed of fat. Skinless  chicken or [place]. Ground chicken or [place]. Pork trimmed of fat. All fish and seafood. Eggs. Dried beans, peas, or lentils. Unsalted nuts or seeds. Unsalted canned or dry beans. Dairy Low-fat dairy products, such as skim or 1% milk, 2% or reduced-fat cheeses, low-fat ricotta or cottage cheese, or plain low-fat yogurt. Fats and Oils Tub margarines without trans fats. Light or reduced-fat mayonnaise and salad dressings. Avocado. Safflower, olive, or canola oils. Natural peanut or almond butter. The items listed above may not be a complete list of recommended foods or beverages. Contact your dietitian for more options. What foods are not recommended? Grains White bread. White pasta. White rice. Cornbread. Bagels, pastries, and croissants. Crackers that contain trans fat. Vegetables White potatoes. Corn. Creamed or fried vegetables. Vegetables in a cheese sauce. Fruits Dried fruits. Canned fruit in light or heavy syrup. Fruit juice. Meat and Other Protein Products Fatty cuts of meat. Ribs, chicken wings, bacon, sausage, bologna, salami, chitterlings, fatback, hot dogs, bratwurst, and packaged luncheon meats. Dairy Whole or 2% milk, cream, half-and-half, and cream cheese. Whole-fat or sweetened yogurt. Full-fat cheeses. Nondairy creamers and whipped toppings. Processed cheese, cheese spreads, or cheese curds. Sweets and Desserts Corn syrup, sugars, honey, and molasses. Candy. Jam and jelly. Syrup. Sweetened cereals. Cookies, pies, cakes, donuts, muffins, and ice cream. Fats and Oils Butter, stick margarine, lard, shortening, ghee, or bacon fat. Coconut, palm kernel, or palm oils. Beverages Alcohol. Sweetened drinks (such as sodas, lemonade, and fruit drinks or punches). The items listed above may not be a complete list of foods and beverages to avoid. Contact your  dietitian for more information. This information is not intended to replace advice given to you by your health care provider. Make  sure you discuss any questions you have with your health care provider. Document Released: 07/22/2004 Document Revised: 03/11/2016 Document Reviewed: 08/08/2013 Elsevier Interactive Patient Education  2017 [employer].

## 2018-03-09 NOTE — Progress Notes (Signed)
Subjective:    Patient ID: Hailey Young, female    DOB: 1950/12/28, 67 y.o.   MRN: [id number]  HPI   Hailey Young, a very pleasant 67 year old female presents for follow-up of chronic conditions.  Patient has a history of anemia.  She has been on iron therapy and the past with maximum relief.  Patient typically eats an iron rich diet.  She denies fatigue, hematuria, or rectal bleeding. Patient also has a history of hyperlipidemia.  Patient has been consistently taking statin therapy.  She also follows a low-fat, low-cholesterol diet.  Patient states that she has been increasing activity level and has decreased weight by 3 pounds.    Past Medical History:  Diagnosis Date  . Perianal condylomata    Social History   Socioeconomic History  . Marital status: Single    Spouse name: Not on file  . Number of children: Not on file  . Years of education: Not on file  . Highest education level: Not on file  Occupational History  . Not on file  Social Needs  . Financial resource strain: Not on file  . Food insecurity:    Worry: Not on file    Inability: Not on file  . Transportation needs:    Medical: Not on file    Non-medical: Not on file  Tobacco Use  . Smoking status: Never Smoker  . Smokeless tobacco: Never Used  Substance and Sexual Activity  . Alcohol use: No  . Drug use: No  . Sexual activity: Not on file  Lifestyle  . Physical activity:    Days per week: Not on file    Minutes per session: Not on file  . Stress: Not on file  Relationships  . Social connections:    Talks on phone: Not on file    Gets together: Not on file    Attends religious service: Not on file    Active member of club or organization: Not on file    Attends meetings of clubs or organizations: Not on file    Relationship status: Not on file  . Intimate partner violence:    Fear of current or ex partner: Not on file    Emotionally abused: Not on file    Physically abused: Not on file   Forced sexual activity: Not on file  Other Topics Concern  . Not on file  Social History Narrative  . Not on file    Immunization History  Administered Date(s) Administered  . Tdap 11/23/2016    Review of Systems  Constitutional: Negative.   HENT: Negative.   Eyes: Negative.   Respiratory: Negative.   Cardiovascular: Negative.   Gastrointestinal: Negative.   Endocrine: Negative.   Genitourinary: Negative.   Musculoskeletal: Negative.   Skin: Negative.   Allergic/Immunologic: Negative.   Neurological: Negative.   Hematological: Negative.   Psychiatric/Behavioral: Negative.        Objective:   Physical Exam  Constitutional: She appears well-developed and well-nourished.  HENT:  Head: Normocephalic and atraumatic.  Eyes: Pupils are equal, round, and reactive to light.  Neck: Normal range of motion.  Cardiovascular: Normal rate, regular rhythm, normal heart sounds and intact distal pulses.  Pulmonary/Chest: Effort normal and breath sounds normal.  Abdominal: Soft. Bowel sounds are normal.  Skin: Skin is warm and dry.  Psychiatric: She has a normal mood and affect. Her behavior is normal. Judgment and thought content normal.         BP (!) 146/94 (BP  Location: Left Arm, Patient Position: Sitting, Cuff Size: Large) Comment: manually  Pulse 96   Temp 98.9 F (37.2 C) (Oral)   Resp 16   Ht  (1.702 m)   Wt 190 lb (86.2 kg)   SpO2 100%   BMI 29.76 kg/m  Assessment & Plan:  1. Anemia, unspecified type Recommend the patient continues iron rich diet - CBC with Differential  2. Essential hypertension Review previous flowsheets, blood pressure consistently greater than 140/90.  Will start a trial of hydrochlorothiazide 12.5 mg daily.  Will review renal functioning as results become available.  Patient will return to office in 1 week for blood pressure check. - Comprehensive metabolic panel - hydrochlorothiazide (HYDRODIURIL) 12.5 MG tablet; Take 1 tablet (12.5  mg total) by mouth daily.  Dispense: 90 tablet; Refill: 0  3. Hyperlipidemia LDL goal <100 The 10-year ASCVD risk score Denman George DC Jr., et al., 2013) is: 10.4%   Values used to calculate the score:     Age: 67 years     Sex: Female     Is Non-Hispanic African American: Yes     Diabetic: No     Tobacco smoker: No     Systolic Blood Pressure: 146 mmHg     Is BP treated: Yes     HDL Cholesterol: 48 mg/dL     Total Cholesterol: 159 mg/dL  - Lipid Panel; Future - atorvastatin (LIPITOR) 20 MG tablet; Take 1 tablet (20 mg total) by mouth daily.  Dispense: 90 tablet; Refill: 1  4. Immunization due - Pneumococcal conjugate vaccine 13-valent  5. Colon cancer screening  - Ambulatory referral to Gastroenterology   RTC, return for fasting lipid panel and blood pressure check in 1 week and follow-up of chronic conditions in 6 months    Hailey Sacra Rennis Petty  MSN, FNP-C [facility] [address] [place], [place] [address] [phone]

## 2018-03-10 LAB — CBC WITH DIFFERENTIAL/PLATELET
BASOS ABS: 0 10*3/uL (ref 0.0–0.2)
Basos: 1 %
EOS (ABSOLUTE): 0.1 10*3/uL (ref 0.0–0.4)
Eos: 3 %
Hematocrit: 36.1 % (ref 34.0–46.6)
Hemoglobin: 12.4 g/dL (ref 11.1–15.9)
IMMATURE GRANS (ABS): 0 10*3/uL (ref 0.0–0.1)
Immature Granulocytes: 0 %
LYMPHS: 44 %
Lymphocytes Absolute: 1.2 10*3/uL (ref 0.7–3.1)
MCH: 33.4 pg — AB (ref 26.6–33.0)
MCHC: 34.3 g/dL (ref 31.5–35.7)
MCV: 97 fL (ref 79–97)
Monocytes Absolute: 0.2 10*3/uL (ref 0.1–0.9)
Monocytes: 8 %
NEUTROS ABS: 1.2 10*3/uL — AB (ref 1.4–7.0)
Neutrophils: 44 %
PLATELETS: 184 10*3/uL (ref 150–450)
RBC: 3.71 x10E6/uL — ABNORMAL LOW (ref 3.77–5.28)
RDW: 13.6 % (ref 12.3–15.4)
WBC: 2.7 10*3/uL — ABNORMAL LOW (ref 3.4–10.8)

## 2018-03-10 LAB — COMPREHENSIVE METABOLIC PANEL
ALT: 18 IU/L (ref 0–32)
AST: 17 IU/L (ref 0–40)
Albumin/Globulin Ratio: 1.6 (ref 1.2–2.2)
Albumin: 4.3 g/dL (ref 3.6–4.8)
Alkaline Phosphatase: 80 IU/L (ref 39–117)
BILIRUBIN TOTAL: 0.2 mg/dL (ref 0.0–1.2)
BUN/Creatinine Ratio: 19 (ref 12–28)
BUN: 18 mg/dL (ref 8–27)
CHLORIDE: 105 mmol/L (ref 96–106)
CO2: 25 mmol/L (ref 20–29)
Calcium: 10 mg/dL (ref 8.7–10.3)
Creatinine, Ser: 0.95 mg/dL (ref 0.57–1.00)
GFR calc non Af Amer: 63 mL/min/{1.73_m2} (ref >59)
GFR, EST AFRICAN AMERICAN: 73 mL/min/{1.73_m2} (ref >59)
GLUCOSE: 77 mg/dL (ref 65–99)
Globulin, Total: 2.7 g/dL (ref 1.5–4.5)
Potassium: 3.9 mmol/L (ref 3.5–5.2)
Sodium: 143 mmol/L (ref 134–144)
TOTAL PROTEIN: 7 g/dL (ref 6.0–8.5)

## 2018-03-16 ENCOUNTER — Ambulatory Visit (INDEPENDENT_AMBULATORY_CARE_PROVIDER_SITE_OTHER): Payer: Medicare Other | Admitting: Family Medicine

## 2018-03-16 VITALS — BP 128/90 | HR 90 | Ht 67.0 in | Wt 190.0 lb

## 2018-03-16 DIAGNOSIS — E785 Hyperlipidemia, unspecified: Secondary | ICD-10-CM

## 2018-03-16 DIAGNOSIS — Z0130 Encounter for examination of blood pressure without abnormal findings: Secondary | ICD-10-CM

## 2018-03-16 NOTE — Progress Notes (Signed)
Patient was advise to continue her medication and keep follow up appointment

## 2018-03-17 LAB — LIPID PANEL
CHOL/HDL RATIO: 3.3 ratio (ref 0.0–4.4)
Cholesterol, Total: 171 mg/dL (ref 100–199)
HDL: 52 mg/dL (ref >39)
LDL CALC: 91 mg/dL (ref 0–99)
TRIGLYCERIDES: 139 mg/dL (ref 0–149)
VLDL CHOLESTEROL CAL: 28 mg/dL (ref 5–40)

## 2018-04-13 ENCOUNTER — Encounter (INDEPENDENT_AMBULATORY_CARE_PROVIDER_SITE_OTHER): Payer: Self-pay | Admitting: Physician Assistant

## 2018-04-13 ENCOUNTER — Ambulatory Visit (INDEPENDENT_AMBULATORY_CARE_PROVIDER_SITE_OTHER): Payer: Medicare Other | Admitting: Physician Assistant

## 2018-04-13 DIAGNOSIS — M170 Bilateral primary osteoarthritis of knee: Secondary | ICD-10-CM | POA: Diagnosis not present

## 2018-04-13 DIAGNOSIS — M1712 Unilateral primary osteoarthritis, left knee: Secondary | ICD-10-CM | POA: Diagnosis not present

## 2018-04-13 DIAGNOSIS — M1711 Unilateral primary osteoarthritis, right knee: Secondary | ICD-10-CM | POA: Diagnosis not present

## 2018-04-13 MED ORDER — METHYLPREDNISOLONE ACETATE 40 MG/ML IJ SUSP
40.0000 mg | INTRAMUSCULAR | Status: AC | PRN
  Administered 2018-04-13: 40 mg via INTRA_ARTICULAR

## 2018-04-13 MED ORDER — LIDOCAINE HCL 1 % IJ SOLN
3.0000 mL | INTRAMUSCULAR | Status: AC | PRN
  Administered 2018-04-13: 3 mL

## 2018-04-13 NOTE — Progress Notes (Signed)
   Procedure Note  Patient: Hailey Young             Date of Birth: December 02, 1950           MRN: [id number]             Visit Date: 04/13/2018 HPI: [name] returns today status post bilateral injections both knees with cortisone 12/26/2017.  States the injections helped for about 3 months now she is having pain in both knees.  She has had no new injury to either knee.  She is requesting injections in both knees.  At this point she says having on and off knee pain.  She has known osteoarthritis of both knees.  Review of systems no fevers chills shortness of breath chest pain.  Physical exam: Bilateral knees good range of motion.  No abnormal warmth erythema or effusion.  Procedures: Visit Diagnoses: Primary osteoarthritis of both knees  Large Joint Inj: bilateral knee on 04/13/2018 1:48 PM Indications: pain Details: 22 G 1.5 in needle, anterolateral approach  Arthrogram: No  Medications (Right): 3 mL lidocaine 1 %; 40 mg methylPREDNISolone acetate 40 MG/ML Medications (Left): 3 mL lidocaine 1 %; 40 mg methylPREDNISolone acetate 40 MG/ML Outcome: tolerated well, no immediate complications Procedure, treatment alternatives, risks and benefits explained, specific risks discussed. Consent was given by the patient. Immediately prior to procedure a time out was called to verify the correct patient, procedure, equipment, support staff and site/side marked as required. Patient was prepped and draped in the usual sterile fashion.     Plan: She will follow-up with us in 3 months for repeat injections in both knees.  Questions encouraged and answered at length.

## 2018-05-10 ENCOUNTER — Encounter: Payer: Self-pay | Admitting: Family Medicine

## 2018-06-05 ENCOUNTER — Other Ambulatory Visit: Payer: Self-pay

## 2018-06-05 DIAGNOSIS — I10 Essential (primary) hypertension: Secondary | ICD-10-CM

## 2018-06-05 MED ORDER — HYDROCHLOROTHIAZIDE 12.5 MG PO TABS
12.5000 mg | ORAL_TABLET | Freq: Every day | ORAL | 0 refills | Status: DC
Start: 2018-06-05 — End: 2018-08-31

## 2018-07-13 ENCOUNTER — Ambulatory Visit (INDEPENDENT_AMBULATORY_CARE_PROVIDER_SITE_OTHER): Payer: Medicare Other | Admitting: Physician Assistant

## 2018-08-03 ENCOUNTER — Ambulatory Visit (INDEPENDENT_AMBULATORY_CARE_PROVIDER_SITE_OTHER): Payer: Medicare Other | Admitting: Physician Assistant

## 2018-08-03 ENCOUNTER — Encounter (INDEPENDENT_AMBULATORY_CARE_PROVIDER_SITE_OTHER): Payer: Self-pay | Admitting: Physician Assistant

## 2018-08-03 DIAGNOSIS — M170 Bilateral primary osteoarthritis of knee: Secondary | ICD-10-CM

## 2018-08-03 MED ORDER — METHYLPREDNISOLONE ACETATE 40 MG/ML IJ SUSP
40.0000 mg | INTRAMUSCULAR | Status: AC | PRN
  Administered 2018-08-03: 40 mg via INTRA_ARTICULAR

## 2018-08-03 MED ORDER — LIDOCAINE HCL 1 % IJ SOLN
0.5000 mL | INTRAMUSCULAR | Status: AC | PRN
  Administered 2018-08-03: .5 mL

## 2018-08-03 MED ORDER — LIDOCAINE HCL 1 % IJ SOLN
5.0000 mL | INTRAMUSCULAR | Status: AC | PRN
  Administered 2018-08-03: 5 mL

## 2018-08-03 NOTE — Progress Notes (Signed)
   Procedure Note  Patient: Hailey Young             Date of Birth: 10-Nov-1950           MRN: [id number]             Visit Date: 08/03/2018  HPI: Mrs. Trulson is well-known to Dr. Magnus Ivan service comes in today requesting injection in both knees.  She was last given cortisone injections in both knees 3 months ago.  She states this really helped.  She is had no new injury to either knee.  She has known osteoarthritis of both knees.  Physical exam: Bilateral knees good range of motion no abnormal warmth erythema.  She has slight effusion of the left knee. Procedures: Visit Diagnoses: Primary osteoarthritis of both knees  Large Joint Inj: bilateral knee on 08/03/2018 5:15 PM Indications: pain Details: 22 G 1.5 in needle, anterolateral approach  Arthrogram: No  Medications (Right): 0.5 mL lidocaine 1 %; 40 mg methylPREDNISolone acetate 40 MG/ML Medications (Left): 40 mg methylPREDNISolone acetate 40 MG/ML; 5 mL lidocaine 1 % Aspirate (Left): 35 mL yellow and blood-tinged Outcome: tolerated well, no immediate complications Procedure, treatment alternatives, risks and benefits explained, specific risks discussed. Consent was given by the patient. Immediately prior to procedure a time out was called to verify the correct patient, procedure, equipment, support staff and site/side marked as required. Patient was prepped and draped in the usual sterile fashion.     Plan: She will work on quad strengthening both knees.  She follow-up with [place] on as-needed basis.  She understands she cannot have cortisone injection more often than every 3 months.

## 2018-08-29 ENCOUNTER — Telehealth: Payer: Self-pay

## 2018-08-31 ENCOUNTER — Encounter: Payer: Self-pay | Admitting: Family Medicine

## 2018-08-31 ENCOUNTER — Ambulatory Visit (INDEPENDENT_AMBULATORY_CARE_PROVIDER_SITE_OTHER): Payer: Medicare Other | Admitting: Family Medicine

## 2018-08-31 VITALS — BP 132/86 | HR 91 | Temp 97.8°F | Resp 16 | Ht 67.0 in | Wt 199.0 lb

## 2018-08-31 DIAGNOSIS — Z1211 Encounter for screening for malignant neoplasm of colon: Secondary | ICD-10-CM | POA: Diagnosis not present

## 2018-08-31 DIAGNOSIS — I10 Essential (primary) hypertension: Secondary | ICD-10-CM

## 2018-08-31 DIAGNOSIS — E785 Hyperlipidemia, unspecified: Secondary | ICD-10-CM | POA: Diagnosis not present

## 2018-08-31 LAB — POCT URINALYSIS DIPSTICK
Bilirubin, UA: NEGATIVE
Glucose, UA: NEGATIVE
Ketones, UA: NEGATIVE
Nitrite, UA: NEGATIVE
Protein, UA: NEGATIVE
Spec Grav, UA: 1.015 (ref 1.010–1.025)
Urobilinogen, UA: 0.2 E.U./dL
pH, UA: 6 (ref 5.0–8.0)

## 2018-08-31 MED ORDER — ATORVASTATIN CALCIUM 20 MG PO TABS: 20.0000 mg | ORAL_TABLET | Freq: Every day | ORAL | 3 refills | Status: DC

## 2018-08-31 MED ORDER — HYDROCHLOROTHIAZIDE 12.5 MG PO TABS: 12.5000 mg | ORAL_TABLET | Freq: Every day | ORAL | 3 refills | Status: DC

## 2018-08-31 NOTE — Progress Notes (Signed)
  [facility]   Progress Note: General Provider: Mike GipAndre Hailey Mcclafferty, FNP  SUBJECTIVE:   Hailey Young is a 67 y.o. female who  has a past medical history of Perianal condylomata.. Patient presents today for Hypertension and Hyperlipidemia   Review of Systems  Constitutional: Negative.   HENT: Negative.   Eyes: Negative.   Respiratory: Negative.   Cardiovascular: Negative.   Gastrointestinal: Negative.   Genitourinary: Negative.   Musculoskeletal: Negative.   Skin: Negative.   Neurological: Negative.   Psychiatric/Behavioral: Negative.      OBJECTIVE: BP 132/86 (BP Location: Right Arm, Patient Position: Sitting, Cuff Size: Large)   Pulse 91   Temp 97.8 F (36.6 C) (Oral)   Resp 16   Ht 5\' 7"  (1.702 m)   Wt 199 lb (90.3 kg)   SpO2 97%   BMI 31.17 kg/m   Physical Exam  Constitutional: She is oriented to person, place, and time. She appears well-developed and well-nourished. No distress.  HENT:  Head: Normocephalic and atraumatic.  Eyes: Pupils are equal, round, and reactive to light. Conjunctivae and EOM are normal.  Neck: Normal range of motion.  Cardiovascular: Normal rate, regular rhythm, normal heart sounds and intact distal pulses.  Pulmonary/Chest: Effort normal and breath sounds normal. No respiratory distress.  Abdominal: Soft. Bowel sounds are normal. She exhibits no distension.  Musculoskeletal: Normal range of motion.  Neurological: She is alert and oriented to person, place, and time.  Skin: Skin is warm and dry.  Psychiatric: She has a normal mood and affect. Her behavior is normal. Thought content normal.  Nursing note and vitals reviewed.   ASSESSMENT/PLAN:   1. Essential hypertension  - Urinalysis Dipstick - hydrochlorothiazide (HYDRODIURIL) 12.5 MG tablet; Take 1 tablet (12.5 mg total) by mouth daily.  Dispense: 90 tablet; Refill: 3  2. Hyperlipidemia LDL goal <100 - atorvastatin (LIPITOR) 20 MG  tablet; Take 1 tablet (20 mg total) by mouth daily.  Dispense: 90 tablet; Refill: 3  3. Screen for colon cancer - Cologuard  The current medical regimen is effective;  continue present plan and medications      The patient was given clear instructions to go to ER or return to medical center if symptoms do not improve, worsen or new problems develop. The patient verbalized understanding and agreed with plan of care.   Ms. Hailey Hailey L. Hailey Lamouglas, FNP-BC [facility] [address]  [place], [place] [id number] [phone]     This note has been created with Dragon speech recognition software and smart phrase technology. Any transcriptional errors are unintentional.

## 2018-08-31 NOTE — Patient Instructions (Addendum)
Everything looks good today. I put in an order for a Cologard. I will see you in 6 months. Please remember to fast (no food or drink except water).    Hypertension Hypertension is another name for high blood pressure. High blood pressure forces your heart to work harder to pump blood. This can cause problems over time. There are two numbers in a blood pressure reading. There is a top number (systolic) over a bottom number (diastolic). It is best to have a blood pressure below 120/80. Healthy choices can help lower your blood pressure. You may need medicine to help lower your blood pressure if:  Your blood pressure cannot be lowered with healthy choices.  Your blood pressure is higher than 130/80.  Follow these instructions at home: Eating and drinking  If directed, follow the DASH eating plan. This diet includes: ? Filling half of your plate at each meal with fruits and vegetables. ? Filling one quarter of your plate at each meal with whole grains. Whole grains include whole wheat pasta, brown rice, and whole grain bread. ? Eating or drinking low-fat dairy products, such as skim milk or low-fat yogurt. ? Filling one quarter of your plate at each meal with low-fat (lean) proteins. Low-fat proteins include fish, skinless chicken, eggs, beans, and tofu. ? Avoiding fatty meat, cured and processed meat, or chicken with skin. ? Avoiding premade or processed food.  Eat less than 1,500 mg of salt (sodium) a day.  Limit alcohol use to no more than 1 drink a day for nonpregnant women and 2 drinks a day for men. One drink equals 12 oz of beer, 5 oz of wine, or 1 oz of hard liquor. Lifestyle  Work with your doctor to stay at a healthy weight or to lose weight. Ask your doctor what the best weight is for you.  Get at least 30 minutes of exercise that causes your heart to beat faster (aerobic exercise) most days of the week. This may include walking, swimming, or biking.  Get at least 30 minutes of  exercise that strengthens your muscles (resistance exercise) at least 3 days a week. This may include lifting weights or pilates.  Do not use any products that contain nicotine or tobacco. This includes cigarettes and e-cigarettes. If you need help quitting, ask your doctor.  Check your blood pressure at home as told by your doctor.  Keep all follow-up visits as told by your doctor. This is important. Medicines  Take over-the-counter and prescription medicines only as told by your doctor. Follow directions carefully.  Do not skip doses of blood pressure medicine. The medicine does not work as well if you skip doses. Skipping doses also puts you at risk for problems.  Ask your doctor about side effects or reactions to medicines that you should watch for. Contact a doctor if:  You think you are having a reaction to the medicine you are taking.  You have headaches that keep coming back (recurring).  You feel dizzy.  You have swelling in your ankles.  You have trouble with your vision. Get help right away if:  You get a very bad headache.  You start to feel confused.  You feel weak or numb.  You feel faint.  You get very bad pain in your: ? Chest. ? Belly (abdomen).  You throw up (vomit) more than once.  You have trouble breathing. Summary  Hypertension is another name for high blood pressure.  Making healthy choices can help lower blood  pressure. If your blood pressure cannot be controlled with healthy choices, you may need to take medicine. This information is not intended to replace advice given to you by your health care provider. Make sure you discuss any questions you have with your health care provider. Document Released: 03/22/2008 Document Revised: 09/01/2016 Document Reviewed: 09/01/2016 Elsevier Interactive Patient Education  [employer].

## 2018-09-11 ENCOUNTER — Ambulatory Visit: Payer: Medicare Other | Admitting: Family Medicine

## 2018-09-18 ENCOUNTER — Other Ambulatory Visit: Payer: Self-pay | Admitting: Family Medicine

## 2018-09-18 DIAGNOSIS — R195 Other fecal abnormalities: Secondary | ICD-10-CM

## 2018-09-18 NOTE — Progress Notes (Signed)
Patient referred to GI due to positive cologard.

## 2018-09-21 NOTE — Telephone Encounter (Signed)
Note not needed 

## 2018-11-23 ENCOUNTER — Encounter (INDEPENDENT_AMBULATORY_CARE_PROVIDER_SITE_OTHER): Payer: Self-pay | Admitting: Physician Assistant

## 2018-11-23 ENCOUNTER — Ambulatory Visit (INDEPENDENT_AMBULATORY_CARE_PROVIDER_SITE_OTHER): Payer: Medicare Other | Admitting: Physician Assistant

## 2018-11-23 VITALS — Ht 67.0 in | Wt 199.0 lb

## 2018-11-23 DIAGNOSIS — M1711 Unilateral primary osteoarthritis, right knee: Secondary | ICD-10-CM

## 2018-11-23 DIAGNOSIS — M170 Bilateral primary osteoarthritis of knee: Secondary | ICD-10-CM

## 2018-11-23 DIAGNOSIS — M1712 Unilateral primary osteoarthritis, left knee: Secondary | ICD-10-CM

## 2018-11-23 MED ORDER — METHYLPREDNISOLONE ACETATE 40 MG/ML IJ SUSP
40.0000 mg | INTRAMUSCULAR | Status: AC | PRN
  Administered 2018-11-23: 40 mg via INTRA_ARTICULAR

## 2018-11-23 MED ORDER — LIDOCAINE HCL 1 % IJ SOLN
0.5000 mL | INTRAMUSCULAR | Status: AC | PRN
  Administered 2018-11-23: .5 mL

## 2018-11-23 NOTE — Progress Notes (Signed)
   Procedure Note  Patient: Hailey Young             Date of Birth: [birth year]           MRN: [id number]             Visit Date: 11/23/2018   HPI: Hailey Young comes in today requesting cortisone injections both knees.  She was last seen in October was given injections in both knees for arthritis of both knees.  She states she is done well until just recently.  She states her pain is now 9 out of 10 pain in both knees.  She had no known injury.  Physical exam: Bilateral knees no effusion abnormal warmth erythema.  She is nontender along medial lateral joint line.  Good range of motion of both knees. Procedures: Visit Diagnoses: Unilateral primary osteoarthritis, left knee  Unilateral primary osteoarthritis, right knee  Large Joint Inj: bilateral knee on 11/23/2018 1:09 PM Indications: pain Details: 25 G 1.5 in needle, anterolateral approach  Arthrogram: No  Medications (Right): 0.5 mL lidocaine 1 %; 40 mg methylPREDNISolone acetate 40 MG/ML Medications (Left): 0.5 mL lidocaine 1 %; 40 mg methylPREDNISolone acetate 40 MG/ML Outcome: tolerated well, no immediate complications Procedure, treatment alternatives, risks and benefits explained, specific risks discussed. Consent was given by the patient. Immediately prior to procedure a time out was called to verify the correct patient, procedure, equipment, support staff and site/side marked as required. Patient was prepped and draped in the usual sterile fashion.     Plan: She will follow-up as needed.  She knows to wait 3 months between injections.  Questions encouraged and answered.

## 2019-03-01 ENCOUNTER — Ambulatory Visit: Payer: Medicare Other | Admitting: Family Medicine

## 2019-03-19 ENCOUNTER — Ambulatory Visit: Payer: Medicare Other | Admitting: Orthopaedic Surgery

## 2019-03-22 ENCOUNTER — Ambulatory Visit (INDEPENDENT_AMBULATORY_CARE_PROVIDER_SITE_OTHER): Payer: Medicare Other | Admitting: Physician Assistant

## 2019-03-22 ENCOUNTER — Other Ambulatory Visit: Payer: Self-pay

## 2019-03-22 ENCOUNTER — Encounter: Payer: Self-pay | Admitting: Physician Assistant

## 2019-03-22 DIAGNOSIS — M1711 Unilateral primary osteoarthritis, right knee: Secondary | ICD-10-CM | POA: Diagnosis not present

## 2019-03-22 DIAGNOSIS — M1712 Unilateral primary osteoarthritis, left knee: Secondary | ICD-10-CM

## 2019-03-22 MED ORDER — METHYLPREDNISOLONE ACETATE 40 MG/ML IJ SUSP
40.0000 mg | INTRAMUSCULAR | Status: AC | PRN
  Administered 2019-03-22: 40 mg via INTRA_ARTICULAR

## 2019-03-22 MED ORDER — LIDOCAINE HCL 1 % IJ SOLN
0.5000 mL | INTRAMUSCULAR | Status: AC | PRN
  Administered 2019-03-22: .5 mL

## 2019-03-22 NOTE — Progress Notes (Signed)
   Procedure Note  Patient: Hailey Young             Date of Birth: 1951/04/23           MRN: [id number]             Visit Date: 03/22/2019 HPI: Hailey Young returns today requesting injection of both knees.  She has known arthritis of both knees.  Last injection was given November 23, 2018 gave her good relief relief until recently.  She is had no new injury.  States pain is worse when standing or sitting.  No mechanical symptoms of either knee.  Physical exam: Bilateral knees good range of motion.  No abnormal warmth erythema or effusion.  Procedures: Visit Diagnoses: Unilateral primary osteoarthritis, right knee - Plan: Large Joint Inj: bilateral knee  Unilateral primary osteoarthritis, left knee - Plan: Large Joint Inj: bilateral knee  Large Joint Inj: bilateral knee on 03/22/2019 4:22 PM Indications: pain Details: 22 G 1.5 in needle, anterolateral approach  Arthrogram: No  Medications (Right): 0.5 mL lidocaine 1 %; 40 mg methylPREDNISolone acetate 40 MG/ML Medications (Left): 0.5 mL lidocaine 1 %; 40 mg methylPREDNISolone acetate 40 MG/ML Outcome: tolerated well, no immediate complications Procedure, treatment alternatives, risks and benefits explained, specific risks discussed. Consent was given by the patient. Immediately prior to procedure a time out was called to verify the correct patient, procedure, equipment, support staff and site/side marked as required. Patient was prepped and draped in the usual sterile fashion.     Plan: She will work on Dance movement psychotherapist.  Follow-up with [place] on as-needed basis understands she needs to wait at least 3 months between injections.

## 2019-06-27 ENCOUNTER — Encounter (HOSPITAL_COMMUNITY): Payer: Self-pay

## 2019-06-27 ENCOUNTER — Encounter (HOSPITAL_COMMUNITY): Payer: Self-pay | Admitting: *Deleted

## 2019-07-05 ENCOUNTER — Ambulatory Visit (INDEPENDENT_AMBULATORY_CARE_PROVIDER_SITE_OTHER): Payer: Medicare Other | Admitting: Physician Assistant

## 2019-07-05 ENCOUNTER — Encounter: Payer: Self-pay | Admitting: Physician Assistant

## 2019-07-05 DIAGNOSIS — M1712 Unilateral primary osteoarthritis, left knee: Secondary | ICD-10-CM | POA: Diagnosis not present

## 2019-07-05 DIAGNOSIS — M1711 Unilateral primary osteoarthritis, right knee: Secondary | ICD-10-CM

## 2019-07-05 MED ORDER — METHYLPREDNISOLONE ACETATE 40 MG/ML IJ SUSP
40.0000 mg | INTRAMUSCULAR | Status: AC | PRN
  Administered 2019-07-05: 40 mg via INTRA_ARTICULAR

## 2019-07-05 MED ORDER — LIDOCAINE HCL 1 % IJ SOLN
0.5000 mL | INTRAMUSCULAR | Status: AC | PRN
  Administered 2019-07-05: .5 mL

## 2019-07-05 NOTE — Progress Notes (Signed)
   Procedure Note  Patient: Hailey Young             Date of Birth: 16-Sep-1951           MRN: [id number]             Visit Date: 07/05/2019 HPI: Hailey Young comes in today requesting injections in both knees.  She is had no known injury to either knee.  She has known osteoarthritis of both knees.  She last had injections back in June states these worked well until recently.  Review of systems.  No fevers chills.  Physical exam: Bilateral knees good range of motion without pain.  No effusion abnormal warmth or erythema of either knee  Procedures: Visit Diagnoses:  1. Unilateral primary osteoarthritis, left knee   2. Unilateral primary osteoarthritis, right knee     Large Joint Inj: bilateral knee on 07/05/2019 1:03 PM Indications: pain Details: 22 G 1.5 in needle, anterolateral approach  Arthrogram: No  Medications (Right): 0.5 mL lidocaine 1 %; 40 mg methylPREDNISolone acetate 40 MG/ML Medications (Left): 0.5 mL lidocaine 1 %; 40 mg methylPREDNISolone acetate 40 MG/ML Outcome: tolerated well, no immediate complications Procedure, treatment alternatives, risks and benefits explained, specific risks discussed. Consent was given by the patient. Immediately prior to procedure a time out was called to verify the correct patient, procedure, equipment, support staff and site/side marked as required. Patient was prepped and draped in the usual sterile fashion.     Plan: She will follow-up with [place] in 3 months for repeat injections in both knees.  Questions were encouraged and answered.  She did ask about a small knot is in her right calf region.  This area is a round mobile mass measuring only a couple millimeters across.  There is a scar in this area.  She is had no recent trauma.  She has no significant pain in the area with palpation of it.  She states that the mass is been there for some years she is unsure if she had an injury as a child.  Mass is not growing.  I did discuss with her  possibly working this up with x-ray and even possibly MRI if it is painful she does not want to proceed with this at this point.  She will keep an eye on this and begins to grow or becomes extremely painful we can always work this up in the future.

## 2019-08-20 ENCOUNTER — Other Ambulatory Visit: Payer: Self-pay | Admitting: Family Medicine

## 2019-08-20 ENCOUNTER — Telehealth: Payer: Self-pay | Admitting: Internal Medicine

## 2019-08-20 DIAGNOSIS — I10 Essential (primary) hypertension: Secondary | ICD-10-CM

## 2019-08-20 DIAGNOSIS — E785 Hyperlipidemia, unspecified: Secondary | ICD-10-CM

## 2019-08-20 NOTE — Telephone Encounter (Signed)
These have been refilled to pharmacy. Thanks!

## 2019-10-04 ENCOUNTER — Ambulatory Visit: Payer: Medicare Other | Admitting: Physician Assistant

## 2019-10-18 ENCOUNTER — Ambulatory Visit: Payer: Medicare Other | Admitting: Physician Assistant

## 2019-11-01 ENCOUNTER — Encounter: Payer: Self-pay | Admitting: Physician Assistant

## 2019-11-01 ENCOUNTER — Other Ambulatory Visit: Payer: Self-pay

## 2019-11-01 ENCOUNTER — Ambulatory Visit (INDEPENDENT_AMBULATORY_CARE_PROVIDER_SITE_OTHER): Payer: Medicare Other | Admitting: Physician Assistant

## 2019-11-01 DIAGNOSIS — M1712 Unilateral primary osteoarthritis, left knee: Secondary | ICD-10-CM

## 2019-11-01 DIAGNOSIS — M1711 Unilateral primary osteoarthritis, right knee: Secondary | ICD-10-CM | POA: Diagnosis not present

## 2019-11-01 MED ORDER — LIDOCAINE HCL 1 % IJ SOLN
0.5000 mL | INTRAMUSCULAR | Status: AC | PRN
  Administered 2019-11-01: .5 mL

## 2019-11-01 MED ORDER — METHYLPREDNISOLONE ACETATE 40 MG/ML IJ SUSP
40.0000 mg | INTRAMUSCULAR | Status: AC | PRN
  Administered 2019-11-01: 40 mg via INTRA_ARTICULAR

## 2019-11-01 NOTE — Progress Notes (Signed)
   Procedure Note  Patient: Hailey Young             Date of Birth: 06-Jan-1951           MRN: [id number]             Visit Date: 11/01/2019  HPI: Hailey Young returns today requesting injections in both knees.  She has known osteoarthritis of both knees.  She has had no injury to either knee.  She states she got good results from the injections on 07/05/2019.  She is doing some quad strengthening exercises at home.  She is recently developed some pain with standing for prolonged periods of time and when going down steps.  ROS: Negative for fevers or chills.   Procedures: Visit Diagnoses:  1. Unilateral primary osteoarthritis, left knee   2. Unilateral primary osteoarthritis, right knee     Large Joint Inj: bilateral knee on 11/01/2019 1:30 PM Indications: pain Details: 22 G 1.5 in needle, anterolateral approach  Arthrogram: No  Medications (Right): 0.5 mL lidocaine 1 %; 40 mg methylPREDNISolone acetate 40 MG/ML Medications (Left): 0.5 mL lidocaine 1 %; 40 mg methylPREDNISolone acetate 40 MG/ML Outcome: tolerated well, no immediate complications Procedure, treatment alternatives, risks and benefits explained, specific risks discussed. Consent was given by the patient. Immediately prior to procedure a time out was called to verify the correct patient, procedure, equipment, support staff and site/side marked as required. Patient was prepped and draped in the usual sterile fashion.     Plan: She will follow-up with [place] on an as-needed basis.  She understands to wait at least 6 months between injections.  Continue to work on Dance movement psychotherapist.  Questions encouraged and answered.

## 2020-01-01 ENCOUNTER — Inpatient Hospital Stay (HOSPITAL_COMMUNITY)
Admission: EM | Admit: 2020-01-01 | Discharge: 2020-01-07 | DRG: 177 | Disposition: A | Discharge status: Home / Self Care | Payer: Medicare Other | Attending: Internal Medicine | Admitting: Internal Medicine

## 2020-01-01 ENCOUNTER — Encounter (HOSPITAL_COMMUNITY): Payer: Self-pay | Admitting: Emergency Medicine

## 2020-01-01 ENCOUNTER — Other Ambulatory Visit: Payer: Self-pay

## 2020-01-01 ENCOUNTER — Emergency Department (HOSPITAL_COMMUNITY): Payer: Medicare Other

## 2020-01-01 DIAGNOSIS — J9601 Acute respiratory failure with hypoxia: Secondary | ICD-10-CM | POA: Diagnosis not present

## 2020-01-01 DIAGNOSIS — E860 Dehydration: Secondary | ICD-10-CM | POA: Diagnosis not present

## 2020-01-01 DIAGNOSIS — M1990 Unspecified osteoarthritis, unspecified site: Secondary | ICD-10-CM | POA: Diagnosis present

## 2020-01-01 DIAGNOSIS — I10 Essential (primary) hypertension: Secondary | ICD-10-CM | POA: Diagnosis present

## 2020-01-01 DIAGNOSIS — R0902 Hypoxemia: Secondary | ICD-10-CM | POA: Diagnosis not present

## 2020-01-01 DIAGNOSIS — R791 Abnormal coagulation profile: Secondary | ICD-10-CM | POA: Diagnosis not present

## 2020-01-01 DIAGNOSIS — Z6828 Body mass index (BMI) 28.0-28.9, adult: Secondary | ICD-10-CM

## 2020-01-01 DIAGNOSIS — D61818 Other pancytopenia: Secondary | ICD-10-CM | POA: Diagnosis not present

## 2020-01-01 DIAGNOSIS — U071 COVID-19: Secondary | ICD-10-CM | POA: Diagnosis present

## 2020-01-01 DIAGNOSIS — Z7982 Long term (current) use of aspirin: Secondary | ICD-10-CM

## 2020-01-01 DIAGNOSIS — D509 Iron deficiency anemia, unspecified: Secondary | ICD-10-CM | POA: Diagnosis not present

## 2020-01-01 DIAGNOSIS — J1282 Pneumonia due to Coronavirus disease 2019: Secondary | ICD-10-CM | POA: Diagnosis present

## 2020-01-01 DIAGNOSIS — E8881 Metabolic syndrome: Secondary | ICD-10-CM | POA: Diagnosis present

## 2020-01-01 DIAGNOSIS — E669 Obesity, unspecified: Secondary | ICD-10-CM | POA: Diagnosis present

## 2020-01-01 DIAGNOSIS — E8770 Fluid overload, unspecified: Secondary | ICD-10-CM | POA: Diagnosis not present

## 2020-01-01 DIAGNOSIS — R000 Tachycardia, unspecified: Secondary | ICD-10-CM | POA: Diagnosis not present

## 2020-01-01 DIAGNOSIS — R918 Other nonspecific abnormal finding of lung field: Secondary | ICD-10-CM | POA: Diagnosis not present

## 2020-01-01 DIAGNOSIS — N179 Acute kidney failure, unspecified: Secondary | ICD-10-CM | POA: Diagnosis present

## 2020-01-01 DIAGNOSIS — R0602 Shortness of breath: Secondary | ICD-10-CM | POA: Diagnosis not present

## 2020-01-01 DIAGNOSIS — R439 Unspecified disturbances of smell and taste: Secondary | ICD-10-CM | POA: Diagnosis present

## 2020-01-01 LAB — CBC WITH DIFFERENTIAL/PLATELET
Abs Immature Granulocytes: 0.01 10*3/uL (ref 0.00–0.07)
Basophils Absolute: 0 10*3/uL (ref 0.0–0.1)
Basophils Relative: 0 %
Eosinophils Absolute: 0 10*3/uL (ref 0.0–0.5)
Eosinophils Relative: 0 %
HCT: 33.4 % — ABNORMAL LOW (ref 36.0–46.0)
Hemoglobin: 10.9 g/dL — ABNORMAL LOW (ref 12.0–15.0)
Immature Granulocytes: 0 %
Lymphocytes Relative: 19 %
Lymphs Abs: 0.6 10*3/uL — ABNORMAL LOW (ref 0.7–4.0)
MCH: 33.5 pg (ref 26.0–34.0)
MCHC: 32.6 g/dL (ref 30.0–36.0)
MCV: 102.8 fL — ABNORMAL HIGH (ref 80.0–100.0)
Monocytes Absolute: 0.2 10*3/uL (ref 0.1–1.0)
Monocytes Relative: 7 %
Neutro Abs: 2.2 10*3/uL (ref 1.7–7.7)
Neutrophils Relative %: 74 %
Platelets: 138 10*3/uL — ABNORMAL LOW (ref 150–400)
RBC: 3.25 MIL/uL — ABNORMAL LOW (ref 3.87–5.11)
RDW: 14.3 % (ref 11.5–15.5)
WBC: 3 10*3/uL — ABNORMAL LOW (ref 4.0–10.5)
nRBC: 0 % (ref 0.0–0.2)

## 2020-01-01 LAB — D-DIMER, QUANTITATIVE: D-Dimer, Quant: 0.67 ug/mL-FEU — ABNORMAL HIGH (ref 0.00–0.50)

## 2020-01-01 LAB — COMPREHENSIVE METABOLIC PANEL
ALT: 28 U/L (ref 0–44)
AST: 42 U/L — ABNORMAL HIGH (ref 15–41)
Albumin: 3.6 g/dL (ref 3.5–5.0)
Alkaline Phosphatase: 83 U/L (ref 38–126)
Anion gap: 12 (ref 5–15)
BUN: 17 mg/dL (ref 8–23)
CO2: 27 mmol/L (ref 22–32)
Calcium: 9 mg/dL (ref 8.9–10.3)
Chloride: 100 mmol/L (ref 98–111)
Creatinine, Ser: 1.22 mg/dL — ABNORMAL HIGH (ref 0.44–1.00)
GFR calc Af Amer: 53 mL/min — ABNORMAL LOW (ref >60)
GFR calc non Af Amer: 46 mL/min — ABNORMAL LOW (ref >60)
Glucose, Bld: 112 mg/dL — ABNORMAL HIGH (ref 70–99)
Potassium: 3.5 mmol/L (ref 3.5–5.1)
Sodium: 139 mmol/L (ref 135–145)
Total Bilirubin: 0.6 mg/dL (ref 0.3–1.2)
Total Protein: 7.2 g/dL (ref 6.5–8.1)

## 2020-01-01 LAB — IRON AND TIBC
Iron: 23 ug/dL — ABNORMAL LOW (ref 28–170)
Saturation Ratios: 8 % — ABNORMAL LOW (ref 10.4–31.8)
TIBC: 275 ug/dL (ref 250–450)
UIBC: 252 ug/dL

## 2020-01-01 LAB — LACTATE DEHYDROGENASE: LDH: 291 U/L — ABNORMAL HIGH (ref 98–192)

## 2020-01-01 LAB — FIBRINOGEN: Fibrinogen: 626 mg/dL — ABNORMAL HIGH (ref 210–475)

## 2020-01-01 LAB — PROCALCITONIN: Procalcitonin: <0.1 ng/mL

## 2020-01-01 LAB — HIV ANTIBODY (ROUTINE TESTING W REFLEX): HIV Screen 4th Generation wRfx: NONREACTIVE

## 2020-01-01 LAB — ABO/RH: ABO/RH(D): A POS

## 2020-01-01 LAB — FERRITIN: Ferritin: 213 ng/mL (ref 11–307)

## 2020-01-01 LAB — LACTIC ACID, PLASMA: Lactic Acid, Venous: 1.2 mmol/L (ref 0.5–1.9)

## 2020-01-01 LAB — POC SARS CORONAVIRUS 2 AG -  ED: SARS Coronavirus 2 Ag: POSITIVE — AB

## 2020-01-01 LAB — TRIGLYCERIDES: Triglycerides: 186 mg/dL — ABNORMAL HIGH (ref <150)

## 2020-01-01 LAB — C-REACTIVE PROTEIN: CRP: 10.9 mg/dL — ABNORMAL HIGH (ref <1.0)

## 2020-01-01 MED ORDER — SODIUM CHLORIDE 0.9 % IV SOLN
100.0000 mg | INTRAVENOUS | Status: AC
  Administered 2020-01-01 (×2): 100 mg via INTRAVENOUS
  Filled 2020-01-01: qty 20

## 2020-01-01 MED ORDER — ATORVASTATIN CALCIUM 20 MG PO TABS
20.0000 mg | ORAL_TABLET | Freq: Every day | ORAL | Status: DC
  Administered 2020-01-01 – 2020-01-07 (×7): 20 mg via ORAL
  Filled 2020-01-01 (×8): qty 1

## 2020-01-01 MED ORDER — ASPIRIN EC 81 MG PO TBEC
81.0000 mg | DELAYED_RELEASE_TABLET | Freq: Every day | ORAL | Status: DC
  Administered 2020-01-01 – 2020-01-07 (×7): 81 mg via ORAL
  Filled 2020-01-01 (×7): qty 1

## 2020-01-01 MED ORDER — ENOXAPARIN SODIUM 40 MG/0.4ML ~~LOC~~ SOLN
40.0000 mg | SUBCUTANEOUS | Status: DC
  Administered 2020-01-01 – 2020-01-06 (×6): 40 mg via SUBCUTANEOUS
  Filled 2020-01-01 (×6): qty 0.4

## 2020-01-01 MED ORDER — SODIUM CHLORIDE 0.9 % IV SOLN: INTRAVENOUS | Status: DC

## 2020-01-01 MED ORDER — ONDANSETRON HCL 4 MG/2ML IJ SOLN: 4.0000 mg | Freq: Four times a day (QID) | INTRAMUSCULAR | Status: DC | PRN

## 2020-01-01 MED ORDER — DOCUSATE SODIUM 100 MG PO CAPS
100.0000 mg | ORAL_CAPSULE | Freq: Two times a day (BID) | ORAL | Status: DC
  Administered 2020-01-02 – 2020-01-07 (×9): 100 mg via ORAL
  Filled 2020-01-01 (×10): qty 1

## 2020-01-01 MED ORDER — ZINC SULFATE 220 (50 ZN) MG PO CAPS
220.0000 mg | ORAL_CAPSULE | Freq: Every day | ORAL | Status: DC
  Administered 2020-01-01 – 2020-01-07 (×7): 220 mg via ORAL
  Filled 2020-01-01 (×7): qty 1

## 2020-01-01 MED ORDER — GUAIFENESIN-DM 100-10 MG/5ML PO SYRP
10.0000 mL | ORAL_SOLUTION | ORAL | Status: DC | PRN
  Administered 2020-01-03: 15:00:00 10 mL via ORAL
  Filled 2020-01-01: qty 10

## 2020-01-01 MED ORDER — ALBUTEROL SULFATE HFA 108 (90 BASE) MCG/ACT IN AERS
2.0000 | INHALATION_SPRAY | RESPIRATORY_TRACT | Status: DC | PRN
  Filled 2020-01-01: qty 6.7

## 2020-01-01 MED ORDER — SODIUM CHLORIDE 0.9 % IV SOLN
100.0000 mg | Freq: Every day | INTRAVENOUS | Status: AC
  Administered 2020-01-02 – 2020-01-04 (×3): 100 mg via INTRAVENOUS
  Filled 2020-01-01 (×4): qty 20

## 2020-01-01 MED ORDER — FERROUS SULFATE 325 (65 FE) MG PO TABS
325.0000 mg | ORAL_TABLET | Freq: Two times a day (BID) | ORAL | Status: DC
  Administered 2020-01-01 – 2020-01-07 (×12): 325 mg via ORAL
  Filled 2020-01-01 (×12): qty 1

## 2020-01-01 MED ORDER — DEXAMETHASONE SODIUM PHOSPHATE 10 MG/ML IJ SOLN
6.0000 mg | INTRAMUSCULAR | Status: DC
  Administered 2020-01-01 – 2020-01-06 (×6): 6 mg via INTRAVENOUS
  Filled 2020-01-01 (×6): qty 1

## 2020-01-01 MED ORDER — ASCORBIC ACID 500 MG PO TABS
500.0000 mg | ORAL_TABLET | Freq: Every day | ORAL | Status: DC
  Administered 2020-01-01 – 2020-01-07 (×7): 500 mg via ORAL
  Filled 2020-01-01 (×7): qty 1

## 2020-01-01 MED ORDER — ONDANSETRON HCL 4 MG PO TABS: 4.0000 mg | ORAL_TABLET | Freq: Four times a day (QID) | ORAL | Status: DC | PRN

## 2020-01-01 MED ORDER — HYDROCOD POLST-CPM POLST ER 10-8 MG/5ML PO SUER: 5.0000 mL | Freq: Two times a day (BID) | ORAL | Status: DC | PRN

## 2020-01-01 MED ORDER — ACETAMINOPHEN 325 MG PO TABS
650.0000 mg | ORAL_TABLET | Freq: Four times a day (QID) | ORAL | Status: DC | PRN
  Administered 2020-01-02 – 2020-01-06 (×3): 650 mg via ORAL
  Filled 2020-01-01 (×3): qty 2

## 2020-01-01 NOTE — ED Notes (Signed)
Hailey Young, daughter, wants an update on her mother, [phone].

## 2020-01-01 NOTE — ED Provider Notes (Signed)
[facility] DEPT Provider Note   CSN: [id number] Arrival date & time: 01/01/20  0830     History Chief Complaint  Patient presents with  . no taste or smell    Hailey Young is a 69 y.o. female.   Patient is a 69 year old female with past medical history of anemia presenting to the emergency department for feeling unwell for 1 week.  Reports that she has had a slight cough, occasional diarrhea and started to vomit yesterday.  One episode of vomiting.  Reports that she has also had loss of taste and smell.  No known sick contacts.  Denies any shortness of breath.        Past Medical History:  Diagnosis Date  . Perianal condylomata     Patient Active Problem List   Diagnosis Date Noted  . Chronic pain of left knee 05/05/2017  . Chronic pain of right knee 05/05/2017  . Unilateral primary osteoarthritis, left knee 05/05/2017  . Unilateral primary osteoarthritis, right knee 02/02/2017  . Elevated blood-pressure reading without diagnosis of hypertension 12/08/2016  . Obesity (BMI 30-39.9) 11/23/2016  . Metabolic syndrome 03/54/6568  . Arthritis 11/23/2016  . Anemia, iron deficiency 07/05/2015  . Obesity 07/05/2015  . Anemia 07/04/2015  . Symptomatic anemia 07/04/2015  . Hypokalemia 07/04/2015  . 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 ablation of perianal warts       OB History   No obstetric history on file.     No family history on file.  Social History   Tobacco Use  . Smoking status: Never Smoker  . Smokeless tobacco: Never Used  Substance Use Topics  . Alcohol use: No  . Drug use: No    Home Medications Prior to Admission medications   Medication Sig Start Date End Date Taking? Authorizing Provider  aspirin EC 81 MG tablet Take 1 tablet (81 mg total) by mouth daily. 11/24/16  Yes Dorena Dew, FNP  atorvastatin (LIPITOR) 20 MG tablet  TAKE 1 TABLET BY MOUTH DAILY 08/20/19  Yes Tresa Garter, MD  ferrous sulfate 325 (65 FE) MG tablet Take 1 tablet (325 mg total) by mouth 2 (two) times daily with a meal. 07/05/15  Yes Tat, Shanon Brow, MD  hydrochlorothiazide (HYDRODIURIL) 12.5 MG tablet TAKE 1 TABLET BY MOUTH DAILY 08/20/19  Yes Jegede, Olugbemiga E, MD  imiquimod (ALDARA) 5 % cream Apply topically 3 (three) times a week. Patient not taking: Reported on 01/01/2020 02/25/17   Dorena Dew, FNP  meloxicam (MOBIC) 7.5 MG tablet Take 1 tablet (7.5 mg total) by mouth daily. Patient not taking: Reported on 01/01/2020 12/17/16   Dorena Dew, FNP  Menthol, Topical Analgesic, (BIOFREEZE) 4 % GEL Apply 1 each topically every 6 (six) hours as needed. Patient not taking: Reported on 01/01/2020 11/23/16   Dorena Dew, FNP    Allergies    Patient has no known allergies.  Review of Systems   Review of Systems  Constitutional: Positive for appetite change, chills and fatigue. Negative for fever.  HENT: Negative for congestion, ear pain, sneezing and sore throat.   Eyes: Negative for pain and visual disturbance.  Respiratory: Positive for cough. Negative for shortness of breath.   Cardiovascular: Negative for chest pain and palpitations.  Gastrointestinal: Positive for diarrhea and vomiting. Negative for abdominal pain and nausea.  Genitourinary: Negative for dysuria and hematuria.  Musculoskeletal: Negative for arthralgias and back  pain.  Skin: Negative for color change and rash.  Neurological: Negative for dizziness, seizures and syncope.  All other systems reviewed and are negative.   Physical Exam Updated Vital Signs BP 107/77   Pulse 92   Temp 99.9 F (37.7 C) (Oral)   Resp 20   SpO2 98%   Physical Exam Vitals and nursing note reviewed.  Constitutional:      Appearance: Normal appearance.  HENT:     Head: Normocephalic.     Nose: Nose normal.     Mouth/Throat:     Mouth: Mucous membranes are moist.      Pharynx: Oropharynx is clear.  Eyes:     Conjunctiva/sclera: Conjunctivae normal.     Pupils: Pupils are equal, round, and reactive to light.  Cardiovascular:     Rate and Rhythm: Normal rate and regular rhythm.  Pulmonary:     Effort: Pulmonary effort is normal.     Breath sounds: Normal breath sounds. No wheezing.  Skin:    General: Skin is dry.  Neurological:     Mental Status: She is alert.  Psychiatric:        Mood and Affect: Mood normal.     ED Results / Procedures / Treatments   Labs (all labs ordered are listed, but only abnormal results are displayed) Labs Reviewed  CBC WITH DIFFERENTIAL/PLATELET - Abnormal; Notable for the following components:      Result Value   WBC 3.0 (*)    RBC 3.25 (*)    Hemoglobin 10.9 (*)    HCT 33.4 (*)    MCV 102.8 (*)    Platelets 138 (*)    Lymphs Abs 0.6 (*)    All other components within normal limits  COMPREHENSIVE METABOLIC PANEL - Abnormal; Notable for the following components:   Glucose, Bld 112 (*)    Creatinine, Ser 1.22 (*)    AST 42 (*)    GFR calc non Af Amer 46 (*)    GFR calc Af Amer 53 (*)    All other components within normal limits  D-DIMER, QUANTITATIVE (NOT AT [facility]) - Abnormal; Notable for the following components:   D-Dimer, Quant 0.67 (*)    All other components within normal limits  LACTATE DEHYDROGENASE - Abnormal; Notable for the following components:   LDH 291 (*)    All other components within normal limits  TRIGLYCERIDES - Abnormal; Notable for the following components:   Triglycerides 186 (*)    All other components within normal limits  FIBRINOGEN - Abnormal; Notable for the following components:   Fibrinogen 626 (*)    All other components within normal limits  POC SARS CORONAVIRUS 2 AG -  ED - Abnormal; Notable for the following components:   SARS Coronavirus 2 Ag POSITIVE (*)    All other components within normal limits  CULTURE, BLOOD (ROUTINE X 2)  CULTURE, BLOOD (ROUTINE X 2)  LACTIC  ACID, PLASMA  LACTIC ACID, PLASMA  PROCALCITONIN  FERRITIN  C-REACTIVE PROTEIN    EKG None  Radiology DG Chest Port 1 View  Result Date: 01/01/2020 CLINICAL DATA:  COVID symptoms. Additional provided: Shortness of breath on exertion, decreased O2 sats in emergency department, patient reports no taste or smell for 1 week, positive COVID test today in emergency department. EXAM: PORTABLE CHEST 1 VIEW COMPARISON:  Chest radiograph 11/11/2016 FINDINGS: Heart size within normal limits. Ill-defined airspace opacity within the right mid lung. Subtle airspace opacity is also questioned within the right lung base  the left lung is clear. No evidence of pleural effusion or pneumothorax. No acute bony abnormality. Overlying cardiac monitoring leads. IMPRESSION: Ill-defined opacity within the right mid lung and possibly the right lung base. Findings are suspicious for pneumonia given provided history. Radiographic follow-up to resolution recommended. Electronically Signed   By: Jackey Loge DO   On: 01/01/2020 12:22    Procedures Procedures (including critical care time)  Medications Ordered in ED Medications - No data to display  ED Course  I have reviewed the triage vital signs and the nursing notes.  Pertinent labs & imaging results that were available during my care of the patient were reviewed by me and considered in my medical decision making (see chart for details).  Clinical Course as of Jan 01 1323  Tue Jan 01, 2020  1144 Patient presenting with covid 19 symptoms of cough, diarrhea, loss of taste and smells for 1 week. Rapid covid +,patient ambulated with oxygen dropping to 70% and significantly SOB. Placed on 2L [place]. Will obtain preadmission labs and admit patient.    [KM]  1323 Patient to be admitted by hospitalist Dr. Lajuana Ripple   [KM]    Clinical Course User Index [KM] Jeral Pinch   MDM Rules/Calculators/A&P                      The patient appears reasonably stabilized  for admission considering the current resources, flow, and capabilities available in the ED at this time, and I doubt any other [facility] requiring further screening and/or treatment in the ED prior to admission.  Final Clinical Impression(s) / ED Diagnoses Final diagnoses:  COVID-19  Hypoxia    Rx / DC Orders ED Discharge Orders    None       Jeral Pinch 01/01/20 1324    Benjiman Core, MD 01/01/20 1459

## 2020-01-01 NOTE — ED Triage Notes (Signed)
Pt reports no taste or smell for a week.

## 2020-01-01 NOTE — H&P (Signed)
History and Physical    DOA: 01/01/2020  PCP: Lanae Boast, [place]  Patient coming from: Home  Chief Complaint: Dyspnea/poor oral intake for 1 week  HPI: Hailey Young is a 69 y.o. female with history h/o hypertension, obesity presents with complaints of feeling sick over the last week.  Patient reports loss of taste and smell associated with nausea and dry cough over the last week.  She had one episode of vomiting yesterday.  She started feeling somewhat dyspneic on walking over the last 2 days.  She was noted to be hypoxic in the ED.  She denies any known sick contacts with COVID-19.  Denies any chest pain, fever or chills.  Denies any leg swellings.  Denies any prior heart or lung disease.  ED course: T-max 99.67F, mildly tachycardic with heart rate 90-1 09, respiratory rate 44-92, systolic blood pressure 010 to 128.  Labs show WBC 3.0, hemoglobin 10.9, hematocrit 33.4, platelet 138, sodium 139, potassium 3.5, chloride 100, bicarb 27, BUN 17, creatinine 1.2, calcium 9.0, glucose 112, procalcitonin less than 0.1, glucose 112, lactate 1.2, Covid POC positive.  Chest x-ray shows right-sided infiltrates.  Patient on 2 L O2, saturating well, also on contact/droplet precautions.  Patient requested to be admitted for further management of COVID-19 pneumonia.   Review of Systems: As per HPI otherwise 10 point review of systems negative.    Past Medical History:  Diagnosis Date  . Perianal condylomata     Past Surgical History:  Procedure Laterality Date  . ablation of perianal warts      Social history:  reports that she has never smoked. She has never used smokeless tobacco. She reports that she does not drink alcohol or use drugs.   No Known Allergies  Family history: Mother had sudden death, unclear cause.  Estranged from father and siblings.  Does not know their medical history.   Prior to Admission medications   Medication Sig Start Date End Date Taking? Authorizing Provider    aspirin EC 81 MG tablet Take 1 tablet (81 mg total) by mouth daily. 11/24/16  Yes Dorena Dew, FNP  atorvastatin (LIPITOR) 20 MG tablet TAKE 1 TABLET BY MOUTH DAILY 08/20/19  Yes Tresa Garter, MD  ferrous sulfate 325 (65 FE) MG tablet Take 1 tablet (325 mg total) by mouth 2 (two) times daily with a meal. 07/05/15  Yes Tat, Shanon Brow, MD  hydrochlorothiazide (HYDRODIURIL) 12.5 MG tablet TAKE 1 TABLET BY MOUTH DAILY 08/20/19  Yes Jegede, Olugbemiga E, MD  imiquimod (ALDARA) 5 % cream Apply topically 3 (three) times a week. Patient not taking: Reported on 01/01/2020 02/25/17   Dorena Dew, FNP  meloxicam (MOBIC) 7.5 MG tablet Take 1 tablet (7.5 mg total) by mouth daily. Patient not taking: Reported on 01/01/2020 12/17/16   Dorena Dew, FNP  Menthol, Topical Analgesic, (BIOFREEZE) 4 % GEL Apply 1 each topically every 6 (six) hours as needed. Patient not taking: Reported on 01/01/2020 11/23/16   Dorena Dew, FNP    Physical Exam: Vitals:   01/01/20 0843 01/01/20 1130 01/01/20 1200 01/01/20 1300  BP: 116/80 123/82 128/86 107/77  Pulse: (!) 109 (!) 103 93 92  Resp: _0 Temp: 99.9 F (37.7 C)     TempSrc: Oral     SpO2: 93% 96% 100% 98%    Constitutional: NAD, calm, comfortable on 2 L O2 via nasal cannula Eyes: PERRL, lids and conjunctivae normal ENMT: Mucous membranes are dry. Posterior pharynx  clear of any exudate or lesions.Normal dentition.  Neck: normal, supple, no masses, no thyromegaly Respiratory: clear to auscultation bilaterally, no wheezing, no crackles. Normal respiratory effort. No accessory muscle use.  Cardiovascular: Regular rhythm, mildly tachycardic, no murmurs / rubs / gallops. No extremity edema. 2+ pedal pulses. No carotid bruits.  Abdomen: no tenderness, no masses palpated. No hepatosplenomegaly. Bowel sounds positive.  Musculoskeletal: no clubbing / cyanosis. No joint deformity upper and lower extremities. Good ROM, no contractures. Normal  muscle tone.  Neurologic: CN 2-12 grossly intact. Sensation intact, DTR normal. Strength 5/5 in all 4.  Psychiatric: Normal judgment and insight. Alert and oriented x 3. Normal mood.  SKIN/catheters: no rashes, lesions, ulcers. No induration  Labs on Admission: I have personally reviewed following labs and imaging studies  CBC: Recent Labs  Lab 01/01/20 1147  WBC 3.0*  NEUTROABS 2.2  HGB 10.9*  HCT 33.4*  MCV 102.8*  PLT 696*   Basic Metabolic Panel: Recent Labs  Lab 01/01/20 1147  NA 139  K 3.5  CL 100  CO2 27  GLUCOSE 112*  BUN 17  CREATININE 1.22*  CALCIUM 9.0   GFR: CrCl cannot be calculated (Unknown ideal weight.). Recent Labs  Lab 01/01/20 1147  PROCALCITON <0.10  WBC 3.0*  LATICACIDVEN 1.2   Liver Function Tests: Recent Labs  Lab 01/01/20 1147  AST 42*  ALT 28  ALKPHOS 83  BILITOT 0.6  PROT 7.2  ALBUMIN 3.6   No results for input(s): LIPASE, AMYLASE in the last 168 hours. No results for input(s): AMMONIA in the last 168 hours. Coagulation Profile: No results for input(s): INR, PROTIME in the last 168 hours. Cardiac Enzymes: No results for input(s): CKTOTAL, CKMB, CKMBINDEX, TROPONINI in the last 168 hours. BNP (last 3 results) No results for input(s): PROBNP in the last 8760 hours. HbA1C: No results for input(s): HGBA1C in the last 72 hours. CBG: No results for input(s): GLUCAP in the last 168 hours. Lipid Profile: Recent Labs    01/01/20 1147  TRIG 186*   Thyroid Function Tests: No results for input(s): TSH, T4TOTAL, FREET4, T3FREE, THYROIDAB in the last 72 hours. Anemia Panel: Recent Labs    01/01/20 1147  FERRITIN 213   Urine analysis:    Component Value Date/Time   COLORURINE YELLOW 07/05/2015 0159   APPEARANCEUR CLEAR 07/05/2015 0159   LABSPEC 1.025 02/24/2017 1338   PHURINE 5.5 02/24/2017 1338   GLUCOSEU NEGATIVE 02/24/2017 1338   HGBUR NEGATIVE 02/24/2017 1338   BILIRUBINUR neg 08/31/2018 [place]  02/24/2017 1338   PROTEINUR Negative 08/31/2018 1527   PROTEINUR NEGATIVE 02/24/2017 1338   UROBILINOGEN 0.2 08/31/2018 1527   UROBILINOGEN 0.2 02/24/2017 1338   NITRITE neg 08/31/2018 1527   NITRITE NEGATIVE 02/24/2017 1338   LEUKOCYTESUR Small (1+) (A) 08/31/2018 1527    Radiological Exams on Admission: Personally reviewed  DG Chest Port 1 View  Result Date: 01/01/2020 CLINICAL DATA:  COVID symptoms. Additional provided: Shortness of breath on exertion, decreased O2 sats in emergency department, patient reports no taste or smell for 1 week, positive COVID test today in emergency department. EXAM: PORTABLE CHEST 1 VIEW COMPARISON:  Chest radiograph 11/11/2016 FINDINGS: Heart size within normal limits. Ill-defined airspace opacity within the right mid lung. Subtle airspace opacity is also questioned within the right lung base the left lung is clear. No evidence of pleural effusion or pneumothorax. No acute bony abnormality. Overlying cardiac monitoring leads. IMPRESSION: Ill-defined opacity within the right mid lung and possibly  the right lung base. Findings are suspicious for pneumonia given provided history. Radiographic follow-up to resolution recommended. Electronically Signed   By: Kellie Simmering DO   On: 01/01/2020 12:22    EKG: Independently reviewed.  Sinus tachycardia with QTC 432 ms     Assessment and Plan:   Principal Problem:   Pneumonia due to COVID-19 virus Active Problems:   Acute respiratory failure with hypoxemia (HCC)   Pancytopenia (HCC)   AKI (acute kidney injury) (HCC)   Anemia, iron deficiency   Obesity   Arthritis    1.  COVID-19 pneumonia with acute hypoxic respiratory failure: Present on admission.  Admit with IV steroids, IV remdesivir, MDI, vitamin C/zinc and supplemental O2.  CRP level pending at this time although not sure if she would be a candidate given pancytopenia.  Other inflammatory markers okay with LDH 291, TG 186, ferritin 213, procalcitonin  less than 0.1, lactate 1.2.  Defer antibiotics at this time.  2.  Pancytopenia: Patient had pancytopenia in the past as well per lab review from 2018/2019.  May benefit from outpatient hematology follow-up for bone marrow biopsy.  Monitor for now in the setting of acute infection.  Per records patient referred to GI as outpatient for an deficiency anemia work-up.  3.  Hypertension: Hold HCTZ due to poor oral intake/dehydration/mild elevation in creatinine and borderline blood pressures.  4.  Mild AKI: In the setting of poor oral intake/acute illness.  Patient's baseline creatinine appears to be around 0.8 2.9, currently elevated at 1.2.  Hold HCTZ and hydrate intravenously.  5. Osteoarthritis: Had received knee injections in the past.  Hold NSAIDs for now in concern for problem #1.  Can use topical creams if needed.  7.  Obesity/metabolic syndrome: Follow-up PCP.  DVT prophylaxis: Lovenox  COVID screen: Positive  Code Status: Full code.Health care proxy would be her daughter  Patient/Family Communication: Discussed with patient and all questions answered to satisfaction.  Consults called: None Admission status :I certify that at the point of admission it is my clinical judgment that the patient will require inpatient hospital care spanning beyond 2 midnights from the point of admission due to high intensity of service and high frequency of surveillance required.Inpatient status is judged to be reasonable and necessary in order to provide the required intensity of service to ensure the patient's safety. The patient's presenting symptoms, physical exam findings, and initial radiographic and laboratory data in the context of their chronic comorbidities is felt to place them at high risk for further clinical deterioration. The following factors support the patient status of inpatient : Acute hypoxic respiratory failure secondary to COVID-19 pneumonia requiring IV remdesivir/IV Decadron  therapy.     Guilford Shi MD [facility] Pager in [place]  If 7PM-7AM, please contact night-coverage [email or web address]   01/01/2020, 2:28 PM

## 2020-01-01 NOTE — ED Notes (Signed)
Pt o2sat at 91% on RA. Pt began ambulating around room and o2sat decreased to 87% RA. After pt got back in bed, her o2sat decreased more into the 70s on RA. Pt placed on 2L [place] and o2sat increased to 97%. Pt reported shob with exertion.

## 2020-01-01 NOTE — ED Notes (Signed)
ED TO INPATIENT HANDOFF REPORT  Name/Age/Gender Hailey Young 69 y.o. female  Code Status    Code Status Orders  (From admission, onward)         Start     Ordered   01/01/20 1327  Full code  Continuous     01/01/20 1328        Code Status History    Date Active Date Inactive Code Status Order ID Comments User Context   07/04/2015 2043 07/05/2015 1546 Full Code [id number]  Lily Kocher, MD Inpatient   Advance Care Planning Activity      Home/SNF/Other Home  Chief Complaint COVID-19 virus infection [U07.1]  Level of Care/Admitting Diagnosis ED Disposition    ED Disposition Condition [place]: [facility] [[id number]]  Level of Care: Med-Surg [16]  May admit patient to [facility] or [facility] if equivalent level of care is available:: Yes  Covid Evaluation: Confirmed COVID Positive  Diagnosis: COVID-19 virus infection [[id number]]  Admitting Physician: Guilford Shi [[id number]]  Attending Physician: Guilford Shi [[id number]]  Estimated length of stay: 3 - 4 days  Certification:: I certify this patient will need inpatient services for at least 2 midnights  Bed request comments: COVID floor       Medical History Past Medical History:  Diagnosis Date  . Perianal condylomata     Allergies No Known Allergies  IV Location/Drains/Wounds Patient Lines/Drains/Airways Status   Active Line/Drains/Airways    Name:   Placement date:   Placement time:   Site:   Days:   Peripheral IV 01/01/20 Right Antecubital   01/01/20    1216    Antecubital   less than 1   Peripheral IV 01/01/20 Left Antecubital   01/01/20    1217    Antecubital   less than 1          Labs/Imaging Results for orders placed or performed during the hospital encounter of 01/01/20 (from the past 48 hour(s))  Lactic acid, plasma     Status: None   Collection Time: 01/01/20 11:47 AM  Result Value Ref Range   Lactic Acid, Venous 1.2 0.5 - 1.9 mmol/L     Comment: Performed at [facility], [place] [address] [place], [place] [address]  CBC WITH DIFFERENTIAL     Status: Abnormal   Collection Time: 01/01/20 11:47 AM  Result Value Ref Range   WBC 3.0 (L) 4.0 - 10.5 K/uL   RBC 3.25 (L) 3.87 - 5.11 MIL/uL   Hemoglobin 10.9 (L) 12.0 - 15.0 g/dL   HCT 33.4 (L) 36.0 - 46.0 %   MCV 102.8 (H) 80.0 - 100.0 fL   MCH 33.5 26.0 - 34.0 pg   MCHC 32.6 30.0 - 36.0 g/dL   RDW 14.3 11.5 - 15.5 %   Platelets 138 (L) 150 - 400 K/uL   nRBC 0.0 0.0 - 0.2 %   Neutrophils Relative % 74 %   Neutro Abs 2.2 1.7 - 7.7 K/uL   Lymphocytes Relative 19 %   Lymphs Abs 0.6 (L) 0.7 - 4.0 K/uL   Monocytes Relative 7 %   Monocytes Absolute 0.2 0.1 - 1.0 K/uL   Eosinophils Relative 0 %   Eosinophils Absolute 0.0 0.0 - 0.5 K/uL   Basophils Relative 0 %   Basophils Absolute 0.0 0.0 - 0.1 K/uL   Immature Granulocytes 0 %   Abs Immature Granulocytes 0.01 0.00 - 0.07 K/uL   Reactive, Benign Lymphocytes PRESENT  Comment: Performed at [facility], [place] [address] [place], [place] [address]  Comprehensive metabolic panel     Status: Abnormal   Collection Time: 01/01/20 11:47 AM  Result Value Ref Range   Sodium 139 135 - 145 mmol/L   Potassium 3.5 3.5 - 5.1 mmol/L   Chloride 100 98 - 111 mmol/L   CO2 27 22 - 32 mmol/L   Glucose, Bld 112 (H) 70 - 99 mg/dL    Comment: Glucose reference range applies only to samples taken after fasting for at least 8 hours.   BUN 17 8 - 23 mg/dL   Creatinine, Ser 1.22 (H) 0.44 - 1.00 mg/dL   Calcium 9.0 8.9 - 10.3 mg/dL   Total Protein 7.2 6.5 - 8.1 g/dL   Albumin 3.6 3.5 - 5.0 g/dL   AST 42 (H) 15 - 41 U/L   ALT 28 0 - 44 U/L   Alkaline Phosphatase 83 38 - 126 U/L   Total Bilirubin 0.6 0.3 - 1.2 mg/dL   GFR calc non Af Amer 46 (L) >60 mL/min   GFR calc Af Amer 53 (L) >60 mL/min   Anion gap 12 5 - 15    Comment: Performed at [facility], [place] [address], [place],  [place] [address]  D-dimer, quantitative     Status: Abnormal   Collection Time: 01/01/20 11:47 AM  Result Value Ref Range   D-Dimer, Quant 0.67 (H) 0.00 - 0.50 ug/mL-FEU    Comment: (NOTE) At the manufacturer cut-off of 0.50 ug/mL FEU, this assay has been documented to exclude PE with a sensitivity and negative predictive value of 97 to 99%.  At this time, this assay has not been approved by the FDA to exclude DVT/VTE. Results should be correlated with clinical presentation. Performed at [facility], [place] [address], [place], [place] [address]   Procalcitonin     Status: None   Collection Time: 01/01/20 11:47 AM  Result Value Ref Range   Procalcitonin <0.10 ng/mL    Comment:        Interpretation: PCT (Procalcitonin) <= 0.5 ng/mL: Systemic infection (sepsis) is not likely. Local bacterial infection is possible. (NOTE)       Sepsis PCT Algorithm           Lower Respiratory Tract                                      Infection PCT Algorithm    ----------------------------     ----------------------------         PCT < 0.25 ng/mL                PCT < 0.10 ng/mL         Strongly encourage             Strongly discourage   discontinuation of antibiotics    initiation of antibiotics    ----------------------------     -----------------------------       PCT 0.25 - 0.50 ng/mL            PCT 0.10 - 0.25 ng/mL               OR       >80% decrease in PCT            Discourage initiation of  antibiotics      Encourage discontinuation           of antibiotics    ----------------------------     -----------------------------         PCT >= 0.50 ng/mL              PCT 0.26 - 0.50 ng/mL               AND        <80% decrease in PCT             Encourage initiation of                                             antibiotics       Encourage continuation           of antibiotics    ----------------------------      -----------------------------        PCT >= 0.50 ng/mL                  PCT > 0.50 ng/mL               AND         increase in PCT                  Strongly encourage                                      initiation of antibiotics    Strongly encourage escalation           of antibiotics                                     -----------------------------                                           PCT <= 0.25 ng/mL                                                 OR                                        > 80% decrease in PCT                                     Discontinue / Do not initiate                                             antibiotics Performed at [place] [address], [place], [place] [address]   Lactate dehydrogenase     Status: Abnormal   Collection Time: 01/01/20  11:47 AM  Result Value Ref Range   LDH 291 (H) 98 - 192 U/L    Comment: Performed at [facility], [place] [address], [place], [place] [address]  Ferritin     Status: None   Collection Time: 01/01/20 11:47 AM  Result Value Ref Range   Ferritin 213 11 - 307 ng/mL    Comment: Performed at [facility], [place] [address], [place], [place] [address]  Triglycerides     Status: Abnormal   Collection Time: 01/01/20 11:47 AM  Result Value Ref Range   Triglycerides 186 (H) <150 mg/dL    Comment: Performed at [facility], [place] [address] [place], [place] [address]  Fibrinogen     Status: Abnormal   Collection Time: 01/01/20 11:47 AM  Result Value Ref Range   Fibrinogen 626 (H) 210 - 475 mg/dL    Comment: Performed at [facility], [place] [address], [place], [place] [address]  C-reactive protein     Status: Abnormal   Collection Time: 01/01/20 11:47 AM  Result Value Ref Range   CRP 10.9 (H) <1.0 mg/dL    Comment: Performed at [facility], [place] [address], [place], [place] [address]  POC SARS Coronavirus 2  Ag-ED - Nasal Swab (BD Veritor Kit)     Status: Abnormal   Collection Time: 01/01/20 11:54 AM  Result Value Ref Range   SARS Coronavirus 2 Ag POSITIVE (A) NEGATIVE    Comment: (NOTE) SARS-CoV-2 antigen PRESENT. Positive results indicate the presence of viral antigens, but clinical correlation with patient history and other diagnostic information is necessary to determine patient infection status.  Positive results do not rule out bacterial infection or co-infection  with other viruses. False positive results are rare but can occur, and confirmatory RT-PCR testing may be appropriate in some circumstances. The expected result is Negative. Fact Sheet for Patients: PodPark.tn Fact Sheet for Providers: GiftContent.is  This test is not yet approved or cleared by the [place] FDA and  has been authorized for detection and/or diagnosis of SARS-CoV-2 by FDA under an Emergency Use Authorization (EUA).  This EUA will remain in effect (meaning this test can be used) for the duration of  the COVID-19 declaration under Section 564(b)(1) of the Act, 21 U.S.C. section 360bbb-3(b)(1), unless the a uthorization is terminated or revoked sooner.    DG Chest Port 1 View  Result Date: 01/01/2020 CLINICAL DATA:  COVID symptoms. Additional provided: Shortness of breath on exertion, decreased O2 sats in emergency department, patient reports no taste or smell for 1 week, positive COVID test today in emergency department. EXAM: PORTABLE CHEST 1 VIEW COMPARISON:  Chest radiograph 11/11/2016 FINDINGS: Heart size within normal limits. Ill-defined airspace opacity within the right mid lung. Subtle airspace opacity is also questioned within the right lung base the left lung is clear. No evidence of pleural effusion or pneumothorax. No acute bony abnormality. Overlying cardiac monitoring leads. IMPRESSION: Ill-defined opacity within the right mid lung and  possibly the right lung base. Findings are suspicious for pneumonia given provided history. Radiographic follow-up to resolution recommended. Electronically Signed   By: Kellie Simmering DO   On: 01/01/2020 12:22    Pending Labs Unresulted Labs (From admission, onward)    Start     Ordered   01/08/20 0500  Creatinine, serum  (enoxaparin (LOVENOX)    CrCl >/= 30 ml/min)  Weekly,   R    Comments: while on enoxaparin therapy  01/01/20 1328   01/02/20 0500  C-reactive protein  Daily,   R     01/01/20 1328   01/02/20 0500  Comprehensive metabolic panel  Daily,   R     01/01/20 1328   01/02/20 0500  CBC with Differential/Platelet  Daily,   R     01/01/20 1328   01/02/20 0500  D-dimer, quantitative (not at [facility])  Daily,   R     01/01/20 1328   01/02/20 0500  Ferritin  Daily,   R     01/01/20 1328   01/01/20 1431  Iron and TIBC  Once,   STAT     01/01/20 1430   01/01/20 1326  HIV Antibody (routine testing w rflx)  (HIV Antibody (Routine testing w reflex) panel)  Once,   STAT     01/01/20 1328   01/01/20 1326  ABO/Rh  Once,   STAT     01/01/20 1328   01/01/20 1147  Blood Culture (routine x 2)  BLOOD CULTURE X 2,   STAT     01/01/20 1146          Vitals/Pain Today's Vitals   01/01/20 0843 01/01/20 1130 01/01/20 1200 01/01/20 1300  BP: 116/80 123/82 128/86 107/77  Pulse: (!) 109 (!) 103 93 92  Resp: _0 Temp: 99.9 F (37.7 C)     TempSrc: Oral     SpO2: 93% 96% 100% 98%  PainSc:        Isolation Precautions Airborne and Contact precautions  Medications Medications  aspirin EC tablet 81 mg (has no administration in time range)  atorvastatin (LIPITOR) tablet 20 mg (has no administration in time range)  ferrous sulfate tablet 325 mg (has no administration in time range)  enoxaparin (LOVENOX) injection 40 mg (has no administration in time range)  0.9 %  sodium chloride infusion (has no administration in time range)  acetaminophen (TYLENOL) tablet 650 mg (has no  administration in time range)  docusate sodium (COLACE) capsule 100 mg (has no administration in time range)  ondansetron (ZOFRAN) tablet 4 mg (has no administration in time range)    Or  ondansetron (ZOFRAN) injection 4 mg (has no administration in time range)  albuterol (VENTOLIN HFA) 108 (90 Base) MCG/ACT inhaler 2 puff (has no administration in time range)  dexamethasone (DECADRON) injection 6 mg (has no administration in time range)  guaiFENesin-dextromethorphan (ROBITUSSIN DM) 100-10 MG/5ML syrup 10 mL (has no administration in time range)  chlorpheniramine-HYDROcodone (TUSSIONEX) 10-8 MG/5ML suspension 5 mL (has no administration in time range)  ascorbic acid (VITAMIN C) tablet 500 mg (has no administration in time range)  zinc sulfate capsule 220 mg (has no administration in time range)  remdesivir 100 mg in sodium chloride 0.9 % 100 mL IVPB (has no administration in time range)    Followed by  remdesivir 100 mg in sodium chloride 0.9 % 100 mL IVPB (has no administration in time range)    Mobility walks

## 2020-01-01 NOTE — Plan of Care (Signed)
  Problem: Education: Goal: Knowledge of risk factors and measures for prevention of condition will improve Outcome: Progressing   Problem: Coping: Goal: Psychosocial and spiritual needs will be supported Outcome: Progressing   Problem: Respiratory: Goal: Will maintain a patent airway Outcome: Progressing Goal: Complications related to the disease process, condition or treatment will be avoided or minimized Outcome: Progressing   Problem: Health Behavior/Discharge Planning: Goal: Ability to manage health-related needs will improve Outcome: Progressing   Problem: Activity: Goal: Risk for activity intolerance will decrease Outcome: Progressing   Problem: Nutrition: Goal: Adequate nutrition will be maintained Outcome: Progressing   Problem: Elimination: Goal: Will not experience complications related to bowel motility Outcome: Progressing Goal: Will not experience complications related to urinary retention Outcome: Progressing   Problem: Pain Managment: Goal: General experience of comfort will improve Outcome: Progressing   Problem: Safety: Goal: Ability to remain free from injury will improve Outcome: Progressing   Problem: Skin Integrity: Goal: Risk for impaired skin integrity will decrease Outcome: Progressing

## 2020-01-02 LAB — CBC WITH DIFFERENTIAL/PLATELET
Abs Immature Granulocytes: 0.01 10*3/uL (ref 0.00–0.07)
Basophils Absolute: 0 10*3/uL (ref 0.0–0.1)
Basophils Relative: 1 %
Eosinophils Absolute: 0 10*3/uL (ref 0.0–0.5)
Eosinophils Relative: 0 %
HCT: 28.9 % — ABNORMAL LOW (ref 36.0–46.0)
Hemoglobin: 9.6 g/dL — ABNORMAL LOW (ref 12.0–15.0)
Immature Granulocytes: 1 %
Lymphocytes Relative: 17 %
Lymphs Abs: 0.3 10*3/uL — ABNORMAL LOW (ref 0.7–4.0)
MCH: 33.4 pg (ref 26.0–34.0)
MCHC: 33.2 g/dL (ref 30.0–36.0)
MCV: 100.7 fL — ABNORMAL HIGH (ref 80.0–100.0)
Monocytes Absolute: 0.1 10*3/uL (ref 0.1–1.0)
Monocytes Relative: 6 %
Neutro Abs: 1.4 10*3/uL — ABNORMAL LOW (ref 1.7–7.7)
Neutrophils Relative %: 75 %
Platelets: 139 10*3/uL — ABNORMAL LOW (ref 150–400)
RBC: 2.87 MIL/uL — ABNORMAL LOW (ref 3.87–5.11)
RDW: 14.1 % (ref 11.5–15.5)
WBC: 1.8 10*3/uL — ABNORMAL LOW (ref 4.0–10.5)
nRBC: 0 % (ref 0.0–0.2)

## 2020-01-02 LAB — COMPREHENSIVE METABOLIC PANEL
ALT: 27 U/L (ref 0–44)
AST: 38 U/L (ref 15–41)
Albumin: 3.1 g/dL — ABNORMAL LOW (ref 3.5–5.0)
Alkaline Phosphatase: 71 U/L (ref 38–126)
Anion gap: 11 (ref 5–15)
BUN: 17 mg/dL (ref 8–23)
CO2: 24 mmol/L (ref 22–32)
Calcium: 8.5 mg/dL — ABNORMAL LOW (ref 8.9–10.3)
Chloride: 104 mmol/L (ref 98–111)
Creatinine, Ser: 0.89 mg/dL (ref 0.44–1.00)
GFR calc Af Amer: >60 mL/min (ref >60)
GFR calc non Af Amer: >60 mL/min (ref >60)
Glucose, Bld: 147 mg/dL — ABNORMAL HIGH (ref 70–99)
Potassium: 3.8 mmol/L (ref 3.5–5.1)
Sodium: 139 mmol/L (ref 135–145)
Total Bilirubin: 0.4 mg/dL (ref 0.3–1.2)
Total Protein: 6.4 g/dL — ABNORMAL LOW (ref 6.5–8.1)

## 2020-01-02 LAB — D-DIMER, QUANTITATIVE: D-Dimer, Quant: 0.54 ug/mL-FEU — ABNORMAL HIGH (ref 0.00–0.50)

## 2020-01-02 LAB — FERRITIN: Ferritin: 213 ng/mL (ref 11–307)

## 2020-01-02 LAB — C-REACTIVE PROTEIN: CRP: 11.7 mg/dL — ABNORMAL HIGH (ref <1.0)

## 2020-01-02 MED ORDER — ALUM & MAG HYDROXIDE-SIMETH 200-200-20 MG/5ML PO SUSP
30.0000 mL | ORAL | Status: DC | PRN
  Administered 2020-01-02: 30 mL via ORAL
  Filled 2020-01-02: qty 30

## 2020-01-02 NOTE — Plan of Care (Signed)
  Problem: Education: Goal: Knowledge of risk factors and measures for prevention of condition will improve Outcome: Progressing   Problem: Coping: Goal: Psychosocial and spiritual needs will be supported Outcome: Progressing   Problem: Respiratory: Goal: Will maintain a patent airway Outcome: Progressing Goal: Complications related to the disease process, condition or treatment will be avoided or minimized Outcome: Progressing   Problem: Health Behavior/Discharge Planning: Goal: Ability to manage health-related needs will improve Outcome: Progressing   Problem: Activity: Goal: Risk for activity intolerance will decrease Outcome: Progressing Pt still requires 2 L O2, desats with activity   Problem: Nutrition: Goal: Adequate nutrition will be maintained Outcome: Progressing   Problem: Elimination: Goal: Will not experience complications related to bowel motility Outcome: Progressing Goal: Will not experience complications related to urinary retention Outcome: Progressing   Problem: Pain Managment: Goal: General experience of comfort will improve Outcome: Progressing   Problem: Safety: Goal: Ability to remain free from injury will improve Outcome: Progressing   Problem: Skin Integrity: Goal: Risk for impaired skin integrity will decrease Outcome: Progressing

## 2020-01-02 NOTE — Progress Notes (Signed)
PROGRESS NOTE    [facility]    Code Status: Full Code  LOV:[id number] DOB: [birth year] DOA: 01/01/2020 LOS: 1 days  PCP: Mike Gip, FNP CC:  Chief Complaint  Patient presents with  . no taste or smell       Hospital Summary   This is a 69 year old female with history of hypertension, obesity admitted on 3/16 for COVID-19.  ED course: T-max 99.32F, mildly tachycardic with heart rate 90-1 09, respiratory rate 18-20, systolic blood pressure 107 to 128.  Labs show WBC 3.0, hemoglobin 10.9, hematocrit 33.4, platelet 138, sodium 139, potassium 3.5, chloride 100, bicarb 27, BUN 17, creatinine 1.2, calcium 9.0, glucose 112, procalcitonin less than 0.1, glucose 112, lactate 1.2, Covid POC positive.  Chest x-ray shows right-sided infiltrates.  Patient on 2 L O2, saturating well, also on contact/droplet precautions.  Patient requested to be admitted for further management of COVID-19 pneumonia.  A & P   Principal Problem:   Pneumonia due to COVID-19 virus Active Problems:   Anemia, iron deficiency   Obesity   Arthritis   Acute respiratory failure with hypoxemia (HCC)   Pancytopenia (HCC)   AKI (acute kidney injury) (HCC)   1. Acute hypoxic respiratory failure secondary to COVID-19 a. Day 2/5 remdesivir, day 2/10 dexamethasone b. she is not a candidate for Actemra at this time given severe neutropenia c. Continue vitamin C/zinc d. Continue inhalers e. Continue to trend inflammatory markers 2. Pancytopenia, likely Covid related a. HIV negative b. Check HCV c. Peripheral smear d. Consider heme-onc consult 3. Hypertension, stable a. HCTZ on hold 4. AKI resolved a. Continue to hold HCTZ 5. Osteoarthritis a. Holding NSAIDs b. Can use topicals if needed 6. Obesity a. Follow-up outpatient  DVT prophylaxis: Lovenox Family Communication: None Disposition Plan:   Patient came from:   Home                                                                                           Anticipated d/c place: Home  Barriers to d/c: Needs improved respiratory status and IV therapies  Pressure injury documentation    None  Consultants  None  Procedures  none  Antibiotics   Anti-infectives (From admission, onward)   Start     Dose/Rate Route Frequency Ordered Stop   01/02/20 1000  remdesivir 100 mg in sodium chloride 0.9 % 100 mL IVPB     100 mg 200 mL/hr over 30 Minutes Intravenous Daily 01/01/20 1346 01/06/20 0959   01/01/20 1400  remdesivir 100 mg in sodium chloride 0.9 % 100 mL IVPB     100 mg 200 mL/hr over 30 Minutes Intravenous Every 1 hr x 2 01/01/20 1346 01/01/20 1843        Subjective   Patient seen and examined at bedside in no acute distress and resting comfortably. No acute events overnight. Denies any acute complaints at this time.  Tolerating diet well.   Objective   Vitals:   01/02/20 0043 01/02/20 0547 01/02/20 1100 01/02/20 1302  BP: 116/78 127/81 115/74 127/78  Pulse: 80 77 76 80  Resp:  16 16 17   Temp: 98.5  F (36.9 C) 97.9 F (36.6 C) 98.4 F (36.9 C) 97.8 F (36.6 C)  TempSrc: Oral Oral Oral Oral  SpO2: 93% 92% 91% 91%  Weight:      Height:        Intake/Output Summary (Last 24 hours) at 01/02/2020 1717 Last data filed at 01/02/2020 1717 Gross per 24 hour  Intake 2873.5 ml  Output 0 ml  Net 2873.5 ml   Filed Weights   01/01/20 1504  Weight: 83.8 kg    Examination:  Physical Exam Vitals and nursing note reviewed.  Constitutional:      Appearance: Normal appearance.  HENT:     Head: Normocephalic and atraumatic.  Eyes:     Conjunctiva/sclera: Conjunctivae normal.  Cardiovascular:     Rate and Rhythm: Normal rate and regular rhythm.  Pulmonary:     Effort: Pulmonary effort is normal. No respiratory distress.     Breath sounds: No wheezing.  Abdominal:     General: Abdomen is flat.     Palpations: Abdomen is soft.  Musculoskeletal:        General: No swelling or tenderness.  Skin:    Coloration:  Skin is not jaundiced or pale.  Neurological:     Mental Status: She is alert. Mental status is at baseline.  Psychiatric:        Mood and Affect: Mood normal.        Behavior: Behavior normal.     Data Reviewed: I have personally reviewed following labs and imaging studies  CBC: Recent Labs  Lab 01/01/20 1147 01/02/20 0245  WBC 3.0* 1.8*  NEUTROABS 2.2 1.4*  HGB 10.9* 9.6*  HCT 33.4* 28.9*  MCV 102.8* 100.7*  PLT 138* 397*   Basic Metabolic Panel: Recent Labs  Lab 01/01/20 1147 01/02/20 0245  NA 139 139  K 3.5 3.8  CL 100 104  CO2 27 24  GLUCOSE 112* 147*  BUN 17 17  CREATININE 1.22* 0.89  CALCIUM 9.0 8.5*   GFR: Estimated Creatinine Clearance: 68.3 mL/min (by C-G formula based on SCr of 0.89 mg/dL). Liver Function Tests: Recent Labs  Lab 01/01/20 1147 01/02/20 0245  AST 42* 38  ALT 28 27  ALKPHOS 83 71  BILITOT 0.6 0.4  PROT 7.2 6.4*  ALBUMIN 3.6 3.1*   No results for input(s): LIPASE, AMYLASE in the last 168 hours. No results for input(s): AMMONIA in the last 168 hours. Coagulation Profile: No results for input(s): INR, PROTIME in the last 168 hours. Cardiac Enzymes: No results for input(s): CKTOTAL, CKMB, CKMBINDEX, TROPONINI in the last 168 hours. BNP (last 3 results) No results for input(s): PROBNP in the last 8760 hours. HbA1C: No results for input(s): HGBA1C in the last 72 hours. CBG: No results for input(s): GLUCAP in the last 168 hours. Lipid Profile: Recent Labs    01/01/20 1147  TRIG 186*   Thyroid Function Tests: No results for input(s): TSH, T4TOTAL, FREET4, T3FREE, THYROIDAB in the last 72 hours. Anemia Panel: Recent Labs    01/01/20 1147 01/01/20 1504 01/02/20 0245  FERRITIN 213  --  213  TIBC  --  275  --   IRON  --  23*  --    Sepsis Labs: Recent Labs  Lab 01/01/20 1147  PROCALCITON <0.10  LATICACIDVEN 1.2    Recent Results (from the past 240 hour(s))  Blood Culture (routine x 2)     Status: None (Preliminary  result)   Collection Time: 01/01/20 11:47 AM   Specimen: BLOOD  Result Value Ref Range Status   Specimen Description   Final    BLOOD RIGHT ANTECUBITAL Performed at [facility], [address] [place], [place] [address]    Special Requests   Final    BOTTLES DRAWN AEROBIC AND ANAEROBIC Blood Culture results may not be optimal due to an excessive volume of blood received in culture bottles Performed at [facility], [address], [place], [place] [address]    Culture   Final    NO GROWTH < 24 HOURS Performed at [facility], [address] [place], [place] [address]    Report Status PENDING  Incomplete  Blood Culture (routine x 2)     Status: None (Preliminary result)   Collection Time: 01/01/20 11:52 AM   Specimen: BLOOD  Result Value Ref Range Status   Specimen Description   Final    BLOOD LEFT ANTECUBITAL Performed at [facility], [address] [place], [place] [address]    Special Requests   Final    BOTTLES DRAWN AEROBIC AND ANAEROBIC Blood Culture results may not be optimal due to an excessive volume of blood received in culture bottles Performed at [facility], [address], [place], [place] [address]    Culture   Final    NO GROWTH < 24 HOURS Performed at [facility], [address] [place], [place] [address]    Report Status PENDING  Incomplete         Radiology Studies: DG Chest Port 1 View  Result Date: 01/01/2020 CLINICAL DATA:  COVID symptoms. Additional provided: Shortness of breath on exertion, decreased O2 sats in emergency department, patient reports no taste or smell for 1 week, positive COVID test today in emergency department. EXAM: PORTABLE CHEST 1 VIEW COMPARISON:  Chest radiograph 11/11/2016 FINDINGS: Heart size within normal limits. Ill-defined airspace opacity within the right mid lung. Subtle airspace opacity is also questioned within the right lung  base the left lung is clear. No evidence of pleural effusion or pneumothorax. No acute bony abnormality. Overlying cardiac monitoring leads. IMPRESSION: Ill-defined opacity within the right mid lung and possibly the right lung base. Findings are suspicious for pneumonia given provided history. Radiographic follow-up to resolution recommended. Electronically Signed   By: Jackey Loge DO   On: 01/01/2020 12:22        Scheduled Meds: . vitamin C  500 mg Oral Daily  . aspirin EC  81 mg Oral Daily  . atorvastatin  20 mg Oral Daily  . dexamethasone (DECADRON) injection  6 mg Intravenous Q24H  . docusate sodium  100 mg Oral BID  . enoxaparin (LOVENOX) injection  40 mg Subcutaneous Q24H  . ferrous sulfate  325 mg Oral BID WC  . zinc sulfate  220 mg Oral Daily   Continuous Infusions: . sodium chloride 75 mL/hr at 01/02/20 1626  . remdesivir 100 mg in NS 100 mL 100 mg (01/02/20 0854)     Time spent: 25 minutes with over 50% of the time coordinating the patient's care    Jae Dire, DO [facility] Pager [phone]  Call night coverage person covering after 7pm

## 2020-01-03 LAB — COMPREHENSIVE METABOLIC PANEL
ALT: 23 U/L (ref 0–44)
AST: 28 U/L (ref 15–41)
Albumin: 2.8 g/dL — ABNORMAL LOW (ref 3.5–5.0)
Alkaline Phosphatase: 62 U/L (ref 38–126)
Anion gap: 7 (ref 5–15)
BUN: 26 mg/dL — ABNORMAL HIGH (ref 8–23)
CO2: 25 mmol/L (ref 22–32)
Calcium: 8.6 mg/dL — ABNORMAL LOW (ref 8.9–10.3)
Chloride: 110 mmol/L (ref 98–111)
Creatinine, Ser: 0.85 mg/dL (ref 0.44–1.00)
GFR calc Af Amer: >60 mL/min (ref >60)
GFR calc non Af Amer: >60 mL/min (ref >60)
Glucose, Bld: 132 mg/dL — ABNORMAL HIGH (ref 70–99)
Potassium: 4 mmol/L (ref 3.5–5.1)
Sodium: 142 mmol/L (ref 135–145)
Total Bilirubin: 0.4 mg/dL (ref 0.3–1.2)
Total Protein: 6 g/dL — ABNORMAL LOW (ref 6.5–8.1)

## 2020-01-03 LAB — CBC WITH DIFFERENTIAL/PLATELET
Abs Immature Granulocytes: 0.02 10*3/uL (ref 0.00–0.07)
Basophils Absolute: 0 10*3/uL (ref 0.0–0.1)
Basophils Relative: 0 %
Eosinophils Absolute: 0 10*3/uL (ref 0.0–0.5)
Eosinophils Relative: 0 %
HCT: 27.7 % — ABNORMAL LOW (ref 36.0–46.0)
Hemoglobin: 9.1 g/dL — ABNORMAL LOW (ref 12.0–15.0)
Immature Granulocytes: 1 %
Lymphocytes Relative: 17 %
Lymphs Abs: 0.5 10*3/uL — ABNORMAL LOW (ref 0.7–4.0)
MCH: 33.6 pg (ref 26.0–34.0)
MCHC: 32.9 g/dL (ref 30.0–36.0)
MCV: 102.2 fL — ABNORMAL HIGH (ref 80.0–100.0)
Monocytes Absolute: 0.2 10*3/uL (ref 0.1–1.0)
Monocytes Relative: 7 %
Neutro Abs: 2.4 10*3/uL (ref 1.7–7.7)
Neutrophils Relative %: 75 %
Platelets: 168 10*3/uL (ref 150–400)
RBC: 2.71 MIL/uL — ABNORMAL LOW (ref 3.87–5.11)
RDW: 14 % (ref 11.5–15.5)
WBC: 3.2 10*3/uL — ABNORMAL LOW (ref 4.0–10.5)
nRBC: 0 % (ref 0.0–0.2)

## 2020-01-03 LAB — C-REACTIVE PROTEIN: CRP: 4.1 mg/dL — ABNORMAL HIGH (ref <1.0)

## 2020-01-03 LAB — FERRITIN: Ferritin: 183 ng/mL (ref 11–307)

## 2020-01-03 LAB — D-DIMER, QUANTITATIVE: D-Dimer, Quant: 0.55 ug/mL-FEU — ABNORMAL HIGH (ref 0.00–0.50)

## 2020-01-03 LAB — PATHOLOGIST SMEAR REVIEW

## 2020-01-03 MED ORDER — POLYVINYL ALCOHOL 1.4 % OP SOLN
1.0000 [drp] | OPHTHALMIC | Status: DC | PRN
  Filled 2020-01-03: qty 15

## 2020-01-03 NOTE — Progress Notes (Signed)
PROGRESS NOTE    Hailey Young    Code Status: Full Code  PFX:[id number] DOB: 02-27-1951 DOA: 01/01/2020 LOS: 2 days  PCP: Lanae Boast, FNP CC:  Chief Complaint  Patient presents with  . no taste or smell       Hospital Summary   This is a 69 year old female with history of hypertension, obesity admitted on 3/16 for COVID-19.  ED course: T-max 99.8F, mildly tachycardic with heart rate 90-1 09, respiratory rate 32-99, systolic blood pressure 242 to 128.  Labs show WBC 3.0, hemoglobin 10.9, hematocrit 33.4, platelet 138, sodium 139, potassium 3.5, chloride 100, bicarb 27, BUN 17, creatinine 1.2, calcium 9.0, glucose 112, procalcitonin less than 0.1, glucose 112, lactate 1.2, Covid POC positive.  Chest x-ray shows right-sided infiltrates.  Patient on 2 L O2, saturating well, also on contact/droplet precautions.  Patient requested to be admitted for further management of COVID-19 pneumonia.  A & P   Principal Problem:   Pneumonia due to COVID-19 virus Active Problems:   Anemia, iron deficiency   Obesity   Arthritis   Acute respiratory failure with hypoxemia (HCC)   Pancytopenia (HCC)   AKI (acute kidney injury) ([place])   1. Acute hypoxic respiratory failure secondary to COVID-19 a. Day 3/5 remdesivir, day 3/10 dexamethasone b. she is not a candidate for Actemra at this time given severe neutropenia c. Continue vitamin C/zinc d. Continue inhalers e. Continue to trend inflammatory markers 2. Pancytopenia, likely Covid related a. HIV negative b. Check HCV c. Pathology peripheral smear -pancytopenia d. Consider heme-onc consult 3. Hypertension, stable a. HCTZ on hold 4. AKI resolved a. Continue to hold HCTZ 5. Osteoarthritis a. Holding NSAIDs b. Can use topicals if needed 6. Obesity a. Follow-up outpatient  DVT prophylaxis: Lovenox Family Communication: None Disposition Plan:   Patient came from:   Home                                                                                           Anticipated d/c place: Home  Barriers to d/c: Needs improved respiratory status and IV therapies  Pressure injury documentation    None  Consultants  None  Procedures  none  Antibiotics   Anti-infectives (From admission, onward)   Start     Dose/Rate Route Frequency Ordered Stop   01/02/20 1000  remdesivir 100 mg in sodium chloride 0.9 % 100 mL IVPB     100 mg 200 mL/hr over 30 Minutes Intravenous Daily 01/01/20 1346 01/06/20 0959   01/01/20 1400  remdesivir 100 mg in sodium chloride 0.9 % 100 mL IVPB     100 mg 200 mL/hr over 30 Minutes Intravenous Every 1 hr x 2 01/01/20 1346 01/01/20 1843        Subjective   Seen and examined at bedside resting comfortably no acute distress.  Currently denies any complaints.  Symptomatically improved but still requiring O2  Objective   Vitals:   01/02/20 1835 01/02/20 1953 01/03/20 0358 01/03/20 1228  BP: 132/83 109/61 126/78 126/81  Pulse: 77 77 65 79  Resp:  17 17 (!) 21  Temp: 97.8 F (36.6 C) 98.4  F (36.9 C) 97.7 F (36.5 C) 98 F (36.7 C)  TempSrc: Oral Oral Oral Oral  SpO2: 93% 93% 92% 91%  Weight:      Height:        Intake/Output Summary (Last 24 hours) at 01/03/2020 1545 Last data filed at 01/03/2020 1005 Gross per 24 hour  Intake 1454.91 ml  Output 0 ml  Net 1454.91 ml   Filed Weights   01/01/20 1504  Weight: 83.8 kg    Examination:  Physical Exam Vitals and nursing note reviewed.  Constitutional:      Appearance: Normal appearance.  HENT:     Head: Normocephalic and atraumatic.  Eyes:     Conjunctiva/sclera: Conjunctivae normal.  Cardiovascular:     Rate and Rhythm: Normal rate.     Pulses: Normal pulses.  Pulmonary:     Effort: Pulmonary effort is normal.     Breath sounds: Normal breath sounds.  Abdominal:     General: Abdomen is flat.     Palpations: Abdomen is soft.  Musculoskeletal:        General: No swelling or tenderness.  Skin:    Coloration: Skin  is not jaundiced or pale.  Neurological:     Mental Status: She is alert. Mental status is at baseline.  Psychiatric:        Mood and Affect: Mood normal.        Behavior: Behavior normal.     Data Reviewed: I have personally reviewed following labs and imaging studies  CBC: Recent Labs  Lab 01/01/20 1147 01/02/20 0245 01/03/20 0349  WBC 3.0* 1.8* 3.2*  NEUTROABS 2.2 1.4* 2.4  HGB 10.9* 9.6* 9.1*  HCT 33.4* 28.9* 27.7*  MCV 102.8* 100.7* 102.2*  PLT 138* 139* 168   Basic Metabolic Panel: Recent Labs  Lab 01/01/20 1147 01/02/20 0245 01/03/20 0349  NA 139 139 142  K 3.5 3.8 4.0  CL 100 104 110  CO2 27 24 25   GLUCOSE 112* 147* 132*  BUN 17 17 26*  CREATININE 1.22* 0.89 0.85  CALCIUM 9.0 8.5* 8.6*   GFR: Estimated Creatinine Clearance: 71.5 mL/min (by C-G formula based on SCr of 0.85 mg/dL). Liver Function Tests: Recent Labs  Lab 01/01/20 1147 01/02/20 0245 01/03/20 0349  AST 42* 38 28  ALT 28 27 23   ALKPHOS 83 71 62  BILITOT 0.6 0.4 0.4  PROT 7.2 6.4* 6.0*  ALBUMIN 3.6 3.1* 2.8*   No results for input(s): LIPASE, AMYLASE in the last 168 hours. No results for input(s): AMMONIA in the last 168 hours. Coagulation Profile: No results for input(s): INR, PROTIME in the last 168 hours. Cardiac Enzymes: No results for input(s): CKTOTAL, CKMB, CKMBINDEX, TROPONINI in the last 168 hours. BNP (last 3 results) No results for input(s): PROBNP in the last 8760 hours. HbA1C: No results for input(s): HGBA1C in the last 72 hours. CBG: No results for input(s): GLUCAP in the last 168 hours. Lipid Profile: Recent Labs    01/01/20 1147  TRIG 186*   Thyroid Function Tests: No results for input(s): TSH, T4TOTAL, FREET4, T3FREE, THYROIDAB in the last 72 hours. Anemia Panel: Recent Labs    01/01/20 1147 01/01/20 1504 01/02/20 0245 01/03/20 0349  FERRITIN   < >  --  213 183  TIBC  --  275  --   --   IRON  --  23*  --   --    < > = values in this interval not  displayed.   Sepsis Labs:  Recent Labs  Lab 01/01/20 1147  PROCALCITON <0.10  LATICACIDVEN 1.2    Recent Results (from the past 240 hour(s))  Blood Culture (routine x 2)     Status: None (Preliminary result)   Collection Time: 01/01/20 11:47 AM   Specimen: BLOOD  Result Value Ref Range Status   Specimen Description   Final    BLOOD RIGHT ANTECUBITAL Performed at [facility], [address], [place], [place] [address]    Special Requests   Final    BOTTLES DRAWN AEROBIC AND ANAEROBIC Blood Culture results may not be optimal due to an excessive volume of blood received in culture bottles Performed at [facility], [address] [place], [place] [address]    Culture   Final    NO GROWTH 2 DAYS Performed at [facility], [address], [place], [place] [address]    Report Status PENDING  Incomplete  Blood Culture (routine x 2)     Status: None (Preliminary result)   Collection Time: 01/01/20 11:52 AM   Specimen: BLOOD  Result Value Ref Range Status   Specimen Description   Final    BLOOD LEFT ANTECUBITAL Performed at [facility], [address], [place], [place] [address]    Special Requests   Final    BOTTLES DRAWN AEROBIC AND ANAEROBIC Blood Culture results may not be optimal due to an excessive volume of blood received in culture bottles Performed at [facility], [address], [place], [place] [address]    Culture   Final    NO GROWTH 2 DAYS Performed at [facility], [address] [place], [place] [address]    Report Status PENDING  Incomplete         Radiology Studies: No results found.      Scheduled Meds: . vitamin C  500 mg Oral Daily  . aspirin EC  81 mg Oral Daily  . atorvastatin  20 mg Oral Daily  . dexamethasone (DECADRON) injection  6 mg Intravenous Q24H  . docusate sodium  100 mg Oral BID  . enoxaparin (LOVENOX) injection  40 mg Subcutaneous Q24H    . ferrous sulfate  325 mg Oral BID WC  . zinc sulfate  220 mg Oral Daily   Continuous Infusions: . sodium chloride 75 mL/hr at 01/02/20 1626  . remdesivir 100 mg in NS 100 mL 100 mg (01/03/20 1052)     Time spent: 25 minutes with over 50% of the time coordinating the patient's care    Jae Dire, DO [facility] Pager [phone]  Call night coverage person covering after 7pm

## 2020-01-04 LAB — CBC WITH DIFFERENTIAL/PLATELET
Abs Immature Granulocytes: 0.06 10*3/uL (ref 0.00–0.07)
Basophils Absolute: 0 10*3/uL (ref 0.0–0.1)
Basophils Relative: 0 %
Eosinophils Absolute: 0 10*3/uL (ref 0.0–0.5)
Eosinophils Relative: 0 %
HCT: 33.2 % — ABNORMAL LOW (ref 36.0–46.0)
Hemoglobin: 10.6 g/dL — ABNORMAL LOW (ref 12.0–15.0)
Immature Granulocytes: 1 %
Lymphocytes Relative: 20 %
Lymphs Abs: 1 10*3/uL (ref 0.7–4.0)
MCH: 33.4 pg (ref 26.0–34.0)
MCHC: 31.9 g/dL (ref 30.0–36.0)
MCV: 104.7 fL — ABNORMAL HIGH (ref 80.0–100.0)
Monocytes Absolute: 0.3 10*3/uL (ref 0.1–1.0)
Monocytes Relative: 7 %
Neutro Abs: 3.6 10*3/uL (ref 1.7–7.7)
Neutrophils Relative %: 72 %
Platelets: 206 10*3/uL (ref 150–400)
RBC: 3.17 MIL/uL — ABNORMAL LOW (ref 3.87–5.11)
RDW: 14.1 % (ref 11.5–15.5)
WBC: 5 10*3/uL (ref 4.0–10.5)
nRBC: 0 % (ref 0.0–0.2)

## 2020-01-04 LAB — C-REACTIVE PROTEIN: CRP: 1.3 mg/dL — ABNORMAL HIGH (ref <1.0)

## 2020-01-04 LAB — COMPREHENSIVE METABOLIC PANEL
ALT: 24 U/L (ref 0–44)
AST: 30 U/L (ref 15–41)
Albumin: 3.2 g/dL — ABNORMAL LOW (ref 3.5–5.0)
Alkaline Phosphatase: 74 U/L (ref 38–126)
Anion gap: 11 (ref 5–15)
BUN: 23 mg/dL (ref 8–23)
CO2: 23 mmol/L (ref 22–32)
Calcium: 9.1 mg/dL (ref 8.9–10.3)
Chloride: 108 mmol/L (ref 98–111)
Creatinine, Ser: 0.75 mg/dL (ref 0.44–1.00)
GFR calc Af Amer: >60 mL/min (ref >60)
GFR calc non Af Amer: >60 mL/min (ref >60)
Glucose, Bld: 125 mg/dL — ABNORMAL HIGH (ref 70–99)
Potassium: 4.1 mmol/L (ref 3.5–5.1)
Sodium: 142 mmol/L (ref 135–145)
Total Bilirubin: 0.4 mg/dL (ref 0.3–1.2)
Total Protein: 6.9 g/dL (ref 6.5–8.1)

## 2020-01-04 LAB — HCV AB W REFLEX TO QUANT PCR: HCV Ab: <0.1 s/co ratio (ref 0.0–0.9)

## 2020-01-04 LAB — D-DIMER, QUANTITATIVE: D-Dimer, Quant: 0.56 ug/mL-FEU — ABNORMAL HIGH (ref 0.00–0.50)

## 2020-01-04 LAB — HCV INTERPRETATION

## 2020-01-04 LAB — FERRITIN: Ferritin: 184 ng/mL (ref 11–307)

## 2020-01-04 MED ORDER — ALBUTEROL SULFATE HFA 108 (90 BASE) MCG/ACT IN AERS
2.0000 | INHALATION_SPRAY | Freq: Four times a day (QID) | RESPIRATORY_TRACT | Status: DC
  Administered 2020-01-04 – 2020-01-07 (×12): 2 via RESPIRATORY_TRACT
  Filled 2020-01-04 (×2): qty 6.7

## 2020-01-04 MED ORDER — PANTOPRAZOLE SODIUM 40 MG PO TBEC
40.0000 mg | DELAYED_RELEASE_TABLET | Freq: Two times a day (BID) | ORAL | Status: DC
  Administered 2020-01-04 – 2020-01-07 (×7): 40 mg via ORAL
  Filled 2020-01-04 (×7): qty 1

## 2020-01-04 NOTE — Care Management Important Message (Signed)
Important Message  Patient Details IM Letter given to Ezekiel Ina RN Case Manager to present to the Patient Name: Hailey Young MRN: [id number] Date of Birth: 22-Dec-1950   Medicare Important Message Given:  Yes     Caren Macadam 01/04/2020, 10:36 AM

## 2020-01-04 NOTE — TOC Progression Note (Signed)
Transition of Care [facility]) - Progression Note    Patient Details  Name: Hailey Young MRN: [id number] Date of Birth: 1951-07-07  [facility] CM/SW Contact  Geni Bers, RN Phone Number: 01/04/2020, 4:23 PM  Clinical Narrative:    Plan to discharge home. May need home O2 at discharge.   Expected Discharge Plan: Home/Self Care Barriers to Discharge: No Barriers Identified  Expected Discharge Plan and Services Expected Discharge Plan: Home/Self Care       Living arrangements for the past 2 months: Single Family Home                                       Social Determinants of Health (SDOH) Interventions    Readmission Risk Interventions No flowsheet data found.

## 2020-01-04 NOTE — Progress Notes (Signed)
PROGRESS NOTE    Hailey Young    Code Status: Full Code  IFO:[id number] DOB: 08-Aug-1951 DOA: 01/01/2020 LOS: 3 days  PCP: Barbette Merino, NP CC:  Chief Complaint  Patient presents with  . no taste or smell       Hospital Summary   This is a 69 year old female with history of hypertension, obesity admitted on 3/16 for COVID-19.  ED course: T-max 99.63F, mildly tachycardic with heart rate 90-1 09, respiratory rate 18-20, systolic blood pressure 107 to 128.  Labs show WBC 3.0, hemoglobin 10.9, hematocrit 33.4, platelet 138, sodium 139, potassium 3.5, chloride 100, bicarb 27, BUN 17, creatinine 1.2, calcium 9.0, glucose 112, procalcitonin less than 0.1, glucose 112, lactate 1.2, Covid POC positive.  Chest x-ray shows right-sided infiltrates.  Patient on 2 L O2, saturating well, also on contact/droplet precautions.  Patient requested to be admitted for further management of COVID-19 pneumonia.  A & P   Principal Problem:   Pneumonia due to COVID-19 virus Active Problems:   Anemia, iron deficiency   Obesity   Arthritis   Acute respiratory failure with hypoxemia (HCC)   Pancytopenia (HCC)   AKI (acute kidney injury) (HCC)   1. Acute hypoxic respiratory failure secondary to COVID-19 a. Day 4/5 remdesivir, day 4/10 dexamethasone b. she is not a candidate for Actemra at this time given severe neutropenia on admission c. Still with persistent O2 requirements d. Continue vitamin C/zinc e. Change inhalers to standing f. Continue to trend inflammatory markers g. Add on Protonix for possible GERD component in overweight patient h. D dimer minimally elevated, will consider CTA chest if no improvement with above changes 2. Pancytopenia, likely Covid related a. HIV negative b. Check HCV c. Pathology peripheral smear -pancytopenia d. Consider heme-onc consult 3. Hypertension, stable a. HCTZ on hold 4. AKI resolved a. Continue to hold HCTZ 5. Osteoarthritis a. Holding  NSAIDs b. Can use topicals if needed 6. Obesity a. Follow-up outpatient  DVT prophylaxis: Lovenox Family Communication: None Disposition Plan:   Patient came from:   Home                                                                                          Anticipated d/c place: Home  Barriers to d/c: Needs improved respiratory status and IV therapies  Pressure injury documentation    None  Consultants  None  Procedures  none  Antibiotics   Anti-infectives (From admission, onward)   Start     Dose/Rate Route Frequency Ordered Stop   01/02/20 1000  remdesivir 100 mg in sodium chloride 0.9 % 100 mL IVPB     100 mg 200 mL/hr over 30 Minutes Intravenous Daily 01/01/20 1346 01/06/20 0959   01/01/20 1400  remdesivir 100 mg in sodium chloride 0.9 % 100 mL IVPB     100 mg 200 mL/hr over 30 Minutes Intravenous Every 1 hr x 2 01/01/20 1346 01/01/20 1843        Subjective   Patient seen and examined at bedside no acute distress and resting comfortably.  No events overnight.  Tolerating diet.  Denies any chest pain, shortness of  breath, fever, nausea, vomiting, urinary complaints.  Admits to having bowel movement.  Otherwise ROS negative   Objective   Vitals:   01/03/20 0358 01/03/20 1228 01/03/20 2001 01/04/20 0611  BP: 126/78 126/81 133/88 135/81  Pulse: 65 79 97 67  Resp: 17 (!) 21 20 18   Temp: 97.7 F (36.5 C) 98 F (36.7 C) 97.9 F (36.6 C) 98.3 F (36.8 C)  TempSrc: Oral Oral Oral Oral  SpO2: 92% 91% 93% 93%  Weight:      Height:        Intake/Output Summary (Last 24 hours) at 01/04/2020 0754 Last data filed at 01/04/2020 0630 Gross per 24 hour  Intake 2930.53 ml  Output 1301 ml  Net 1629.53 ml   Filed Weights   01/01/20 1504  Weight: 83.8 kg    Examination:  Physical Exam Vitals and nursing note reviewed.  Constitutional:      Appearance: Normal appearance.  HENT:     Head: Normocephalic and atraumatic.  Eyes:     Conjunctiva/sclera:  Conjunctivae normal.  Cardiovascular:     Rate and Rhythm: Normal rate.  Pulmonary:     Effort: Pulmonary effort is normal. No respiratory distress.     Comments: On 2 L/min O2 Abdominal:     General: Abdomen is flat.  Musculoskeletal:        General: No swelling or tenderness.  Skin:    Coloration: Skin is not jaundiced or pale.  Neurological:     Mental Status: She is alert. Mental status is at baseline.  Psychiatric:        Mood and Affect: Mood normal.        Behavior: Behavior normal.     Data Reviewed: I have personally reviewed following labs and imaging studies  CBC: Recent Labs  Lab 01/01/20 1147 01/02/20 0245 01/03/20 0349 01/04/20 0334  WBC 3.0* 1.8* 3.2* 5.0  NEUTROABS 2.2 1.4* 2.4 3.6  HGB 10.9* 9.6* 9.1* 10.6*  HCT 33.4* 28.9* 27.7* 33.2*  MCV 102.8* 100.7* 102.2* 104.7*  PLT 138* 139* 168 235   Basic Metabolic Panel: Recent Labs  Lab 01/01/20 1147 01/02/20 0245 01/03/20 0349 01/04/20 0334  NA 139 139 142 142  K 3.5 3.8 4.0 4.1  CL 100 104 110 108  CO2 27 24 25 23   GLUCOSE 112* 147* 132* 125*  BUN 17 17 26* 23  CREATININE 1.22* 0.89 0.85 0.75  CALCIUM 9.0 8.5* 8.6* 9.1   GFR: Estimated Creatinine Clearance: 75.9 mL/min (by C-G formula based on SCr of 0.75 mg/dL). Liver Function Tests: Recent Labs  Lab 01/01/20 1147 01/02/20 0245 01/03/20 0349 01/04/20 0334  AST 42* 38 28 30  ALT 28 27 23 24   ALKPHOS 83 71 62 74  BILITOT 0.6 0.4 0.4 0.4  PROT 7.2 6.4* 6.0* 6.9  ALBUMIN 3.6 3.1* 2.8* 3.2*   No results for input(s): LIPASE, AMYLASE in the last 168 hours. No results for input(s): AMMONIA in the last 168 hours. Coagulation Profile: No results for input(s): INR, PROTIME in the last 168 hours. Cardiac Enzymes: No results for input(s): CKTOTAL, CKMB, CKMBINDEX, TROPONINI in the last 168 hours. BNP (last 3 results) No results for input(s): PROBNP in the last 8760 hours. HbA1C: No results for input(s): HGBA1C in the last 72  hours. CBG: No results for input(s): GLUCAP in the last 168 hours. Lipid Profile: Recent Labs    01/01/20 1147  TRIG 186*   Thyroid Function Tests: No results for input(s): TSH, T4TOTAL, FREET4, T3FREE,  THYROIDAB in the last 72 hours. Anemia Panel: Recent Labs    01/01/20 1504 01/02/20 0245 01/03/20 0349 01/04/20 0334  FERRITIN  --    < > 183 184  TIBC 275  --   --   --   IRON 23*  --   --   --    < > = values in this interval not displayed.   Sepsis Labs: Recent Labs  Lab 01/01/20 1147  PROCALCITON <0.10  LATICACIDVEN 1.2    Recent Results (from the past 240 hour(s))  Blood Culture (routine x 2)     Status: None (Preliminary result)   Collection Time: 01/01/20 11:47 AM   Specimen: BLOOD  Result Value Ref Range Status   Specimen Description   Final    BLOOD RIGHT ANTECUBITAL Performed at [facility], [address] [place], [place] [address]    Special Requests   Final    BOTTLES DRAWN AEROBIC AND ANAEROBIC Blood Culture results may not be optimal due to an excessive volume of blood received in culture bottles Performed at [facility], [address] [place], [place] [address]    Culture   Final    NO GROWTH 2 DAYS Performed at [facility], [address] [place], [place] [address]    Report Status PENDING  Incomplete  Blood Culture (routine x 2)     Status: None (Preliminary result)   Collection Time: 01/01/20 11:52 AM   Specimen: BLOOD  Result Value Ref Range Status   Specimen Description   Final    BLOOD LEFT ANTECUBITAL Performed at [facility], [address], [place], [place] [address]    Special Requests   Final    BOTTLES DRAWN AEROBIC AND ANAEROBIC Blood Culture results may not be optimal due to an excessive volume of blood received in culture bottles Performed at [facility], [address], [place], [place] [address]    Culture   Final    NO GROWTH 2  DAYS Performed at [facility], [address] [place], [place] [address]    Report Status PENDING  Incomplete         Radiology Studies: No results found.      Scheduled Meds: . vitamin C  500 mg Oral Daily  . aspirin EC  81 mg Oral Daily  . atorvastatin  20 mg Oral Daily  . dexamethasone (DECADRON) injection  6 mg Intravenous Q24H  . docusate sodium  100 mg Oral BID  . enoxaparin (LOVENOX) injection  40 mg Subcutaneous Q24H  . ferrous sulfate  325 mg Oral BID WC  . zinc sulfate  220 mg Oral Daily   Continuous Infusions: . sodium chloride 75 mL/hr at 01/04/20 0539  . remdesivir 100 mg in NS 100 mL Stopped (01/03/20 1130)     Time spent: 20 minutes with over 50% of the time coordinating the patient's care    Jae Dire, DO [facility] Pager [phone]  Call night coverage person covering after 7pm

## 2020-01-05 ENCOUNTER — Inpatient Hospital Stay (HOSPITAL_COMMUNITY): Payer: Medicare Other

## 2020-01-05 LAB — CBC WITH DIFFERENTIAL/PLATELET
Abs Immature Granulocytes: 0.08 10*3/uL — ABNORMAL HIGH (ref 0.00–0.07)
Basophils Absolute: 0 10*3/uL (ref 0.0–0.1)
Basophils Relative: 0 %
Eosinophils Absolute: 0 10*3/uL (ref 0.0–0.5)
Eosinophils Relative: 0 %
HCT: 28.3 % — ABNORMAL LOW (ref 36.0–46.0)
Hemoglobin: 9.2 g/dL — ABNORMAL LOW (ref 12.0–15.0)
Immature Granulocytes: 2 %
Lymphocytes Relative: 13 %
Lymphs Abs: 0.6 10*3/uL — ABNORMAL LOW (ref 0.7–4.0)
MCH: 33.1 pg (ref 26.0–34.0)
MCHC: 32.5 g/dL (ref 30.0–36.0)
MCV: 101.8 fL — ABNORMAL HIGH (ref 80.0–100.0)
Monocytes Absolute: 0.3 10*3/uL (ref 0.1–1.0)
Monocytes Relative: 7 %
Neutro Abs: 3.5 10*3/uL (ref 1.7–7.7)
Neutrophils Relative %: 78 %
Platelets: 230 10*3/uL (ref 150–400)
RBC: 2.78 MIL/uL — ABNORMAL LOW (ref 3.87–5.11)
RDW: 13.9 % (ref 11.5–15.5)
WBC: 4.5 10*3/uL (ref 4.0–10.5)
nRBC: 0.4 % — ABNORMAL HIGH (ref 0.0–0.2)

## 2020-01-05 LAB — COMPREHENSIVE METABOLIC PANEL
ALT: 21 U/L (ref 0–44)
AST: 23 U/L (ref 15–41)
Albumin: 2.7 g/dL — ABNORMAL LOW (ref 3.5–5.0)
Alkaline Phosphatase: 71 U/L (ref 38–126)
Anion gap: 8 (ref 5–15)
BUN: 26 mg/dL — ABNORMAL HIGH (ref 8–23)
CO2: 24 mmol/L (ref 22–32)
Calcium: 9.4 mg/dL (ref 8.9–10.3)
Chloride: 108 mmol/L (ref 98–111)
Creatinine, Ser: 0.76 mg/dL (ref 0.44–1.00)
GFR calc Af Amer: >60 mL/min (ref >60)
GFR calc non Af Amer: >60 mL/min (ref >60)
Glucose, Bld: 166 mg/dL — ABNORMAL HIGH (ref 70–99)
Potassium: 4.3 mmol/L (ref 3.5–5.1)
Sodium: 140 mmol/L (ref 135–145)
Total Bilirubin: 0.4 mg/dL (ref 0.3–1.2)
Total Protein: 5.7 g/dL — ABNORMAL LOW (ref 6.5–8.1)

## 2020-01-05 LAB — D-DIMER, QUANTITATIVE: D-Dimer, Quant: 0.62 ug/mL-FEU — ABNORMAL HIGH (ref 0.00–0.50)

## 2020-01-05 LAB — FERRITIN: Ferritin: 102 ng/mL (ref 11–307)

## 2020-01-05 LAB — GLUCOSE, CAPILLARY: Glucose-Capillary: 165 mg/dL — ABNORMAL HIGH (ref 70–99)

## 2020-01-05 LAB — C-REACTIVE PROTEIN: CRP: 1.1 mg/dL — ABNORMAL HIGH (ref <1.0)

## 2020-01-05 MED ORDER — FUROSEMIDE 10 MG/ML IJ SOLN
40.0000 mg | Freq: Once | INTRAMUSCULAR | Status: AC
  Administered 2020-01-05: 40 mg via INTRAVENOUS
  Filled 2020-01-05: qty 4

## 2020-01-05 MED ORDER — SODIUM CHLORIDE (PF) 0.9 % IJ SOLN
INTRAMUSCULAR | Status: AC
  Filled 2020-01-05: qty 50

## 2020-01-05 MED ORDER — IOHEXOL 350 MG/ML SOLN
100.0000 mL | Freq: Once | INTRAVENOUS | Status: AC | PRN
  Administered 2020-01-05: 100 mL via INTRAVENOUS

## 2020-01-05 MED ORDER — SODIUM CHLORIDE 0.9 % IV SOLN
100.0000 mg | INTRAVENOUS | Status: AC
  Administered 2020-01-05: 100 mg via INTRAVENOUS
  Filled 2020-01-05: qty 20

## 2020-01-05 MED ORDER — POLYETHYLENE GLYCOL 3350 17 G PO PACK
17.0000 g | PACK | Freq: Every day | ORAL | Status: DC
  Administered 2020-01-05 – 2020-01-07 (×3): 17 g via ORAL
  Filled 2020-01-05 (×3): qty 1

## 2020-01-05 NOTE — Progress Notes (Signed)
PROGRESS NOTE    Hailey Young    Code Status: Full Code  QQI:[id number] DOB: [birth year] DOA: 01/01/2020 LOS: 4 days  PCP: Barbette Merino, NP CC:  Chief Complaint  Patient presents with  . no taste or smell       Hospital Summary   This is a 69 year old female with history of hypertension, obesity admitted on 3/16 for COVID-19.  ED course: T-max 99.81F, mildly tachycardic with heart rate 90-1 09, respiratory rate 18-20, systolic blood pressure 107 to 128.  Labs show WBC 3.0, hemoglobin 10.9, hematocrit 33.4, platelet 138, sodium 139, potassium 3.5, chloride 100, bicarb 27, BUN 17, creatinine 1.2, calcium 9.0, glucose 112, procalcitonin less than 0.1, glucose 112, lactate 1.2, Covid POC positive.  Chest x-ray shows right-sided infiltrates.  Patient on 2 L O2, saturating well, also on contact/droplet precautions.  Patient requested to be admitted for further management of COVID-19 pneumonia.  A & P   Principal Problem:   Pneumonia due to COVID-19 virus Active Problems:   Anemia, iron deficiency   Obesity   Arthritis   Acute respiratory failure with hypoxemia (HCC)   Pancytopenia (HCC)   AKI (acute kidney injury) (HCC)   1. Acute hypoxic respiratory failure secondary to COVID-19 a. Day 5/5 remdesivir, day 5/10 dexamethasone b. she is not a candidate for Actemra at this time given severe neutropenia on admission c. Still with persistent O2 requirements. 4L/min likely false and still requiring 2L/min d. Continue vitamin C/zinc e. Change inhalers to standing f. Continue to trend inflammatory markers g. Add on Protonix for possible GERD component in overweight patient h. D dimer minimally elevated with persistent hypoxia, will get CTA chest to rule out PE i. +6L -> Lasix 40 mg IV x 1 j. PT eval 2. Pancytopenia, improved.  likely Covid related a. HIV negative b. Pathology peripheral smear -pancytopenia 3. Hypertension, stable a. HCTZ on hold 4. AKI resolved a. Continue to  hold HCTZ 5. Osteoarthritis a. Holding NSAIDs b. Can use topicals if needed 6. Obesity a. Follow-up outpatient  DVT prophylaxis: Lovenox Family Communication: None Disposition Plan:   Patient came from:   Home                                                                                          Anticipated d/c place: Home  Barriers to d/c: Needs improved respiratory status and IV therapies  Pressure injury documentation    None  Consultants  None  Procedures  none  Antibiotics   Anti-infectives (From admission, onward)   Start     Dose/Rate Route Frequency Ordered Stop   01/05/20 1145  remdesivir 100 mg in sodium chloride 0.9 % 100 mL IVPB     100 mg 200 mL/hr over 30 Minutes Intravenous NOW 01/05/20 1131 01/05/20 1227   01/02/20 1000  remdesivir 100 mg in sodium chloride 0.9 % 100 mL IVPB     100 mg 200 mL/hr over 30 Minutes Intravenous Daily 01/01/20 1346 01/05/20 1201   01/01/20 1400  remdesivir 100 mg in sodium chloride 0.9 % 100 mL IVPB     100 mg 200 mL/hr over  30 Minutes Intravenous Every 1 hr x 2 01/01/20 1346 01/01/20 1843        Subjective  Seen and examined at bedside sitting upright in no acute distress.  O2 decreased to room air and SPO2 on finger read 83%.  Required 5 L/min nasal cannula.  Patient remained asymptomatic at this time.  I asked patient to lie flat which she did comfortably.  After discussing with bedside nurse patient had finger pulse ox changed to earlobe and was able to tolerate 2 L/min nasal cannula.  She denies any complaints at this time is hoping to go home  Objective   Vitals:   01/05/20 1041 01/05/20 1052 01/05/20 1157 01/05/20 1508  BP:    127/80  Pulse:    98  Resp:    20  Temp:    (!) 97.5 F (36.4 C)  TempSrc:    Oral  SpO2: 98% 96% 99% 96%  Weight:      Height:        Intake/Output Summary (Last 24 hours) at 01/05/2020 1607 Last data filed at 01/05/2020 1033 Gross per 24 hour  Intake 1582.56 ml  Output 300 ml    Net 1282.56 ml   Filed Weights   01/01/20 1504  Weight: 83.8 kg    Examination:  Physical Exam Vitals and nursing note reviewed.  Constitutional:      Appearance: Normal appearance.  HENT:     Head: Normocephalic and atraumatic.  Eyes:     Conjunctiva/sclera: Conjunctivae normal.  Cardiovascular:     Rate and Rhythm: Normal rate and regular rhythm.  Pulmonary:     Effort: Pulmonary effort is normal.     Breath sounds: Normal breath sounds.     Comments: Hypoxic on room air at 83% with finger pulse ox however patient was asymptomatic.  Section increased to 5 L/min with resolution of hypoxia however patient remained asymptomatic and without orthopnea Abdominal:     General: Abdomen is flat.     Palpations: Abdomen is soft.  Musculoskeletal:        General: No swelling or tenderness.  Skin:    Coloration: Skin is not jaundiced or pale.  Neurological:     Mental Status: She is alert. Mental status is at baseline.  Psychiatric:        Mood and Affect: Mood normal.        Behavior: Behavior normal.     Data Reviewed: I have personally reviewed following labs and imaging studies  CBC: Recent Labs  Lab 01/01/20 1147 01/02/20 0245 01/03/20 0349 01/04/20 0334 01/05/20 0258  WBC 3.0* 1.8* 3.2* 5.0 4.5  NEUTROABS 2.2 1.4* 2.4 3.6 3.5  HGB 10.9* 9.6* 9.1* 10.6* 9.2*  HCT 33.4* 28.9* 27.7* 33.2* 28.3*  MCV 102.8* 100.7* 102.2* 104.7* 101.8*  PLT 138* 139* 168 206 230   Basic Metabolic Panel: Recent Labs  Lab 01/01/20 1147 01/02/20 0245 01/03/20 0349 01/04/20 0334 01/05/20 0258  NA 139 139 142 142 140  K 3.5 3.8 4.0 4.1 4.3  CL 100 104 110 108 108  CO2 27 24 25 23 24   GLUCOSE 112* 147* 132* 125* 166*  BUN 17 17 26* 23 26*  CREATININE 1.22* 0.89 0.85 0.75 0.76  CALCIUM 9.0 8.5* 8.6* 9.1 9.4   GFR: Estimated Creatinine Clearance: 75.9 mL/min (by C-G formula based on SCr of 0.76 mg/dL). Liver Function Tests: Recent Labs  Lab 01/01/20 1147 01/02/20 0245  01/03/20 0349 01/04/20 0334 01/05/20 0258  AST 42* 38 28  30 23  ALT 28 27 23 24 21   ALKPHOS 83 71 62 74 71  BILITOT 0.6 0.4 0.4 0.4 0.4  PROT 7.2 6.4* 6.0* 6.9 5.7*  ALBUMIN 3.6 3.1* 2.8* 3.2* 2.7*   No results for input(s): LIPASE, AMYLASE in the last 168 hours. No results for input(s): AMMONIA in the last 168 hours. Coagulation Profile: No results for input(s): INR, PROTIME in the last 168 hours. Cardiac Enzymes: No results for input(s): CKTOTAL, CKMB, CKMBINDEX, TROPONINI in the last 168 hours. BNP (last 3 results) No results for input(s): PROBNP in the last 8760 hours. HbA1C: No results for input(s): HGBA1C in the last 72 hours. CBG: No results for input(s): GLUCAP in the last 168 hours. Lipid Profile: No results for input(s): CHOL, HDL, LDLCALC, TRIG, CHOLHDL, LDLDIRECT in the last 72 hours. Thyroid Function Tests: No results for input(s): TSH, T4TOTAL, FREET4, T3FREE, THYROIDAB in the last 72 hours. Anemia Panel: Recent Labs    01/04/20 0334 01/05/20 0258  FERRITIN 184 102   Sepsis Labs: Recent Labs  Lab 01/01/20 1147  PROCALCITON <0.10  LATICACIDVEN 1.2    Recent Results (from the past 240 hour(s))  Blood Culture (routine x 2)     Status: None (Preliminary result)   Collection Time: 01/01/20 11:47 AM   Specimen: BLOOD  Result Value Ref Range Status   Specimen Description   Final    BLOOD RIGHT ANTECUBITAL Performed at [facility], [address], [place], [place]    Special Requests   Final    BOTTLES DRAWN AEROBIC AND ANAEROBIC Blood Culture results may not be optimal due to an excessive volume of blood received in culture bottles Performed at [facility], [address], [place], [place]    Culture   Final    NO GROWTH 4 DAYS Performed at [facility], [address], [place], [place]    Report Status PENDING  Incomplete  Blood Culture (routine x 2)     Status: None  (Preliminary result)   Collection Time: 01/01/20 11:52 AM   Specimen: BLOOD  Result Value Ref Range Status   Specimen Description   Final    BLOOD LEFT ANTECUBITAL Performed at [facility] [address], [place], [place]    Special Requests   Final    BOTTLES DRAWN AEROBIC AND ANAEROBIC Blood Culture results may not be optimal due to an excessive volume of blood received in culture bottles Performed at [facility] [address], [place], [place]    Culture   Final    NO GROWTH 4 DAYS Performed at [facility], [address] [place], [place]    Report Status PENDING  Incomplete         Radiology Studies: No results found.      Scheduled Meds: . albuterol  2 puff Inhalation QID  . vitamin C  500 mg Oral Daily  . aspirin EC  81 mg Oral Daily  . atorvastatin  20 mg Oral Daily  . dexamethasone (DECADRON) injection  6 mg Intravenous Q24H  . docusate sodium  100 mg Oral BID  . enoxaparin (LOVENOX) injection  40 mg Subcutaneous Q24H  . ferrous sulfate  325 mg Oral BID WC  . pantoprazole  40 mg Oral BID  . polyethylene glycol  17 g Oral Daily  . zinc sulfate  220 mg Oral Daily   Continuous Infusions: .  sodium chloride 75 mL/hr at 01/05/20 9169     Time spent: 26 minutes with over 50% of the time coordinating the patient's care    Harold Hedge, DO [facility] Pager [phone]  Call night coverage person covering after 7pm

## 2020-01-05 NOTE — Progress Notes (Signed)
SATURATION QUALIFICATIONS: (This note is used to comply with regulatory documentation for home oxygen)  Patient Saturations on Room Air at Rest = 92%  Patient Saturations on Room Air while Ambulating = 84%  Patient Saturations on 2 Liters of oxygen while Ambulating = 91%  Please briefly explain why patient needs home oxygen: Patient's P2 Sats 98-99% 2L/[place] at rest. Pt then dropped to 84% RA while ambulating. O2 Sats quickly increased to >91% when 2L/[place] applied.

## 2020-01-05 NOTE — Progress Notes (Signed)
Patient with continuous pulse ox on her left hand/finger, O2 Sats upper 80s to lower 90s on 5L/[place]. Pt with no signs of respiratory distress- denies SOB, unlabored breathing, able to achieve 2500 on IS. Even tolerated getting OOB to [facility] well. O2 Sats reassessed on earlobe- pt is currently 98% 3L/[place]. Will continue to monitor and wean as able. Will be charting O2 Sats checked on earlobe from this time forward.

## 2020-01-06 LAB — CBC WITH DIFFERENTIAL/PLATELET
Abs Immature Granulocytes: 0.12 10*3/uL — ABNORMAL HIGH (ref 0.00–0.07)
Basophils Absolute: 0 10*3/uL (ref 0.0–0.1)
Basophils Relative: 0 %
Eosinophils Absolute: 0 10*3/uL (ref 0.0–0.5)
Eosinophils Relative: 0 %
HCT: 29.5 % — ABNORMAL LOW (ref 36.0–46.0)
Hemoglobin: 9.5 g/dL — ABNORMAL LOW (ref 12.0–15.0)
Immature Granulocytes: 2 %
Lymphocytes Relative: 14 %
Lymphs Abs: 0.8 10*3/uL (ref 0.7–4.0)
MCH: 33.3 pg (ref 26.0–34.0)
MCHC: 32.2 g/dL (ref 30.0–36.0)
MCV: 103.5 fL — ABNORMAL HIGH (ref 80.0–100.0)
Monocytes Absolute: 0.4 10*3/uL (ref 0.1–1.0)
Monocytes Relative: 8 %
Neutro Abs: 4.2 10*3/uL (ref 1.7–7.7)
Neutrophils Relative %: 76 %
Platelets: 267 10*3/uL (ref 150–400)
RBC: 2.85 MIL/uL — ABNORMAL LOW (ref 3.87–5.11)
RDW: 14.2 % (ref 11.5–15.5)
WBC: 5.6 10*3/uL (ref 4.0–10.5)
nRBC: 0.4 % — ABNORMAL HIGH (ref 0.0–0.2)

## 2020-01-06 LAB — COMPREHENSIVE METABOLIC PANEL
ALT: 22 U/L (ref 0–44)
AST: 20 U/L (ref 15–41)
Albumin: 2.8 g/dL — ABNORMAL LOW (ref 3.5–5.0)
Alkaline Phosphatase: 75 U/L (ref 38–126)
Anion gap: 11 (ref 5–15)
BUN: 27 mg/dL — ABNORMAL HIGH (ref 8–23)
CO2: 23 mmol/L (ref 22–32)
Calcium: 9.8 mg/dL (ref 8.9–10.3)
Chloride: 106 mmol/L (ref 98–111)
Creatinine, Ser: 0.84 mg/dL (ref 0.44–1.00)
GFR calc Af Amer: >60 mL/min (ref >60)
GFR calc non Af Amer: >60 mL/min (ref >60)
Glucose, Bld: 148 mg/dL — ABNORMAL HIGH (ref 70–99)
Potassium: 4 mmol/L (ref 3.5–5.1)
Sodium: 140 mmol/L (ref 135–145)
Total Bilirubin: 0.4 mg/dL (ref 0.3–1.2)
Total Protein: 5.8 g/dL — ABNORMAL LOW (ref 6.5–8.1)

## 2020-01-06 LAB — CULTURE, BLOOD (ROUTINE X 2)
Culture: NO GROWTH
Culture: NO GROWTH

## 2020-01-06 LAB — BRAIN NATRIURETIC PEPTIDE: B Natriuretic Peptide: 146.3 pg/mL — ABNORMAL HIGH (ref 0.0–100.0)

## 2020-01-06 LAB — C-REACTIVE PROTEIN: CRP: 1.4 mg/dL — ABNORMAL HIGH (ref <1.0)

## 2020-01-06 LAB — D-DIMER, QUANTITATIVE: D-Dimer, Quant: 0.34 ug/mL-FEU (ref 0.00–0.50)

## 2020-01-06 LAB — FERRITIN: Ferritin: 88 ng/mL (ref 11–307)

## 2020-01-06 MED ORDER — FUROSEMIDE 10 MG/ML IJ SOLN
40.0000 mg | Freq: Once | INTRAMUSCULAR | Status: AC
  Administered 2020-01-06: 40 mg via INTRAVENOUS
  Filled 2020-01-06: qty 4

## 2020-01-06 NOTE — Progress Notes (Signed)
O2 Sats 94-98% RA while at rest. O2 Sats dropped to 89% RA while ambulating around room with standby assist. Pt could not tolerate ambulation long before feeling weak and needing to sit down. HR up to 140s, does come back down quickly after resting. MD Dairl Ponder made aware of updates.

## 2020-01-06 NOTE — Evaluation (Signed)
Physical Therapy Evaluation Patient Details Name: Hailey Young MRN: [id number] DOB: August 31, 1951 Today's Date: 01/06/2020   History of Present Illness  69 year old female admitted with PNA due to COVID 19. PMH: Arthritis, anemia, obesity; AKI on this admission  Clinical Impression  Patient was resting in bed when PT arrived. She agreed to participate, but said "I really thought that I was going home today- but I really do not feel good". She relates that her back has been bothering her ever since she was diagnosed with COVID. She reports that her daughter lives with her (could not confirm or determine if daughter works). They reportedly live in first floor apt with no outside steps. She was independent in all ADLs. Tub-shower combo with no safety bars. Commode is standard height. Used no AD for ambulation. She is generally weak today, unsteady. She presented with flat affect. Instructed in HEP and printed sheet issued for UE/LE ex. Should benefit  from PT during hospitalization for optimal functional outcomes. She presents as at risk for falls currently. Suggest HH or at the least 24/7 supervision.    Follow Up Recommendations Home health PT;Supervision/Assistance - 24 hour(Did not discuss this and could not confirm if her daughter works)    Materials engineer for Other Services       Precautions / Restrictions Precautions Precautions: Fall Precaution Comments: She is generally weak and unsteady, presenting with risk of falls today. Restrictions Weight Bearing Restrictions: No      Mobility  Bed Mobility Overal bed mobility: Needs Assistance Bed Mobility: Rolling;Sidelying to Sit;Supine to Sit;Sit to Supine;Sit to Sidelying Rolling: Min guard;Min assist Sidelying to sit: Min guard;Min assist Supine to sit: Min guard;Min assist Sit to supine: Min guard;Min assist Sit to sidelying: Min guard;Min assist    Transfers Overall transfer level: Needs  assistance Equipment used: 1 person hand held assist Transfers: Sit to/from Stand Sit to Stand: Min guard;Min assist         General transfer comment: She presents as generally weak and at risk for falls.  Ambulation/Gait Ambulation/Gait assistance: Min assist;Mod assist(Recommend RW for increasing safety and distance) Gait Distance (Feet): 20 Feet Assistive device: 1 person hand held assist Gait Pattern/deviations: Step-to pattern;Shuffle;Wide base of support;Antalgic   Gait velocity interpretation: 1.31 - 2.62 ft/sec, indicative of limited community Conservation officer, historic buildings Rankin (Stroke Patients Only)       Balance Overall balance assessment: Needs assistance Sitting-balance support: No upper extremity supported Sitting balance-Leahy Scale: Good     Standing balance support: Single extremity supported(Tended to touch surfaces)                                 Pertinent Vitals/Pain Pain Assessment: 0-10 Pain Score: 4  Pain Location: back- says only started since hosptialization Pain Descriptors / Indicators: Aching;Nagging;Pressure    Home Living Family/patient expects to be discharged to:: Private residence Living Arrangements: Children(states that her daughter lives with her) Available Help at Discharge: Family;Friend(s) Type of Home: Apartment(first floor, no outside steps)       Home Layout: One level Home Equipment: None Additional Comments: She states that she was independent in all ADLs.    Prior Function Level of Independence: Independent  Hand Dominance   Dominant Hand: Right    Extremity/Trunk Assessment        Lower Extremity Assessment Lower Extremity Assessment: Generalized weakness    Cervical / Trunk Assessment Cervical / Trunk Assessment: Kyphotic  Communication   Communication: No difficulties  Cognition Arousal/Alertness: Awake/alert Behavior  During Therapy: Flat affect Overall Cognitive Status: Within Functional Limits for tasks assessed                                 General Comments: She presents as needy- flat affect- said she thought she was going home today- she is generally weak and at risk for falls      General Comments General comments (skin integrity, edema, etc.): Monitor closely for skin and joint integrity    Exercises General Exercises - Lower Extremity Ankle Circles/Pumps: AROM;Seated;Both;10 reps Quad Sets: AROM;Seated;Both;10 reps Gluteal Sets: AROM;Seated;10 reps Short Arc Quad: AROM;Seated;Both;10 reps Heel Slides: AROM;Seated;10 reps Hip ABduction/ADduction: AROM;Seated;10 reps Other Exercises Other Exercises: Reminded to perform these ex at least 2 x daily 10 reps each time   Assessment/Plan    PT Assessment Patient needs continued PT services  PT Problem List Decreased strength;Decreased activity tolerance       PT Treatment Interventions Therapeutic activities;Therapeutic exercise;Patient/family education;Gait training    PT Goals (Current goals can be found in the Care Plan section)  Acute Rehab PT Goals Patient Stated Goal: Just want to go home- but I really do not feel good PT Goal Formulation: With patient Time For Goal Achievement: 01/20/20 Potential to Achieve Goals: Good    Frequency Min 3X/week   Barriers to discharge        Co-evaluation               AM-PAC PT "6 Clicks" Mobility  Outcome Measure Help needed turning from your back to your side while in a flat bed without using bedrails?: A Little Help needed moving from lying on your back to sitting on the side of a flat bed without using bedrails?: A Little Help needed moving to and from a bed to a chair (including a wheelchair)?: A Little Help needed standing up from a chair using your arms (e.g., wheelchair or bedside chair)?: A Little Help needed to walk in hospital room?: A Lot Help needed  climbing 3-5 steps with a railing? : A Little 6 Click Score: 17    End of Session   Activity Tolerance: Patient limited by fatigue(generally weak) Patient left: in bed Nurse Communication: Mobility status PT Visit Diagnosis: Unsteadiness on feet (R26.81);Muscle weakness (generalized) (M62.81);Other abnormalities of gait and mobility (R26.89)    Time: 0932-3557 PT Time Calculation (min) (ACUTE ONLY): 35 min   Charges:   PT Evaluation $PT Eval Moderate Complexity: 1 Mod PT Treatments $Therapeutic Exercise: 8-22 mins        Harriett Rush, PT # [phone] CGV cell  Ephriam Jenkins 01/06/2020, 4:45 PM

## 2020-01-06 NOTE — Progress Notes (Signed)
PROGRESS NOTE    TIEA MANNINEN    Code Status: Full Code  WUJ:[id number] DOB: 09-Jul-1951 DOA: 01/01/2020 LOS: 5 days  PCP: Barbette Merino, NP CC:  Chief Complaint  Patient presents with  . no taste or smell       Hospital Summary   This is a 69 year old female with history of hypertension, obesity admitted on 3/16 for COVID-19.  ED course: T-max 99.107F, mildly tachycardic with heart rate 90-1 09, respiratory rate 18-20, systolic blood pressure 107 to 128.  Labs show WBC 3.0, hemoglobin 10.9, hematocrit 33.4, platelet 138, sodium 139, potassium 3.5, chloride 100, bicarb 27, BUN 17, creatinine 1.2, calcium 9.0, glucose 112, procalcitonin less than 0.1, glucose 112, lactate 1.2, Covid POC positive.  Chest x-ray shows right-sided infiltrates.  Patient on 2 L O2, saturating well, also on contact/droplet precautions.  Patient requested to be admitted for further management of COVID-19 pneumonia.  A & P   Principal Problem:   Pneumonia due to COVID-19 virus Active Problems:   Anemia, iron deficiency   Obesity   Arthritis   Acute respiratory failure with hypoxemia (HCC)   Pancytopenia (HCC)   AKI (acute kidney injury) (HCC)   1. Acute hypoxic respiratory failure secondary to COVID-19 a. Day 5/5 remdesivir, day 6/10 dexamethasone b. she is not a candidate for Actemra at this time given severe neutropenia on admission c. Significantly improved O2 requirements, desats to 89% on room air on ambulation but with weakness after short distances ->PT eval d. Continue vitamin C/zinc e. Continue scheduled inhalers f. Continue incentive spirometry g. Continue to trend inflammatory markers h. Protonix for possible GERD component in overweight patient i. CTA negative for PE j. Continue with diuresis k. PT eval 2. Pancytopenia, improved.  likely Covid related a. HIV negative b. Pathology peripheral smear -pancytopenia 3. Hypertension, stable a. HCTZ on hold 4. AKI resolved a. Continue  to hold HCTZ 5. Osteoarthritis a. Holding NSAIDs b. Can use topicals if needed 6. Obesity a. Follow-up outpatient  DVT prophylaxis: Lovenox Family Communication: None Disposition Plan:   Patient came from:   Home                                                                                          Anticipated d/c place: Home  Barriers to d/c: Needs PT eval and IV diuresis  Pressure injury documentation    None  Consultants  None  Procedures  none  Antibiotics   Anti-infectives (From admission, onward)   Start     Dose/Rate Route Frequency Ordered Stop   01/05/20 1145  remdesivir 100 mg in sodium chloride 0.9 % 100 mL IVPB     100 mg 200 mL/hr over 30 Minutes Intravenous NOW 01/05/20 1131 01/05/20 1227   01/02/20 1000  remdesivir 100 mg in sodium chloride 0.9 % 100 mL IVPB     100 mg 200 mL/hr over 30 Minutes Intravenous Daily 01/01/20 1346 01/05/20 1201   01/01/20 1400  remdesivir 100 mg in sodium chloride 0.9 % 100 mL IVPB     100 mg 200 mL/hr over 30 Minutes Intravenous Every 1 hr x 2  01/01/20 1346 01/01/20 1843        Subjective  Patient is lying flat and resting comfortably at bedside without any conversational dyspnea.  States she wants to go home and is very frustrated that she is still hospitalized.  Occasionally has desaturations with earlobe pulse ox though she does not have any symptoms during this time and is hard to tell if this is accurate or not.  Patient's nurse performed ambulatory pulse ox and noted patient had desaturation to 89% on room air with minimal ambulation but more significantly had generalized weakness and had to sit down to rest after 4-5 steps.  She denies any complaints at this time.  Objective   Vitals:   01/05/20 2210 01/06/20 0442 01/06/20 0451 01/06/20 1255  BP: 119/89 122/82  116/82  Pulse: 94 81  (!) 116  Resp: 20 18  18   Temp: 97.7 F (36.5 C) 97.9 F (36.6 C)  97.7 F (36.5 C)  TempSrc: Oral Oral  Oral  SpO2: 90% 91%   95%  Weight:   83.8 kg   Height:        Intake/Output Summary (Last 24 hours) at 01/06/2020 1414 Last data filed at 01/06/2020 1403 Gross per 24 hour  Intake 1560.67 ml  Output 4400 ml  Net -2839.33 ml   Filed Weights   01/01/20 1504 01/06/20 0451  Weight: 83.8 kg 83.8 kg    Examination:  Physical Exam Vitals and nursing note reviewed.  Constitutional:      Appearance: Normal appearance.  HENT:     Head: Normocephalic and atraumatic.  Eyes:     Conjunctiva/sclera: Conjunctivae normal.  Cardiovascular:     Rate and Rhythm: Normal rate and regular rhythm.  Pulmonary:     Effort: Pulmonary effort is normal.     Breath sounds: Normal breath sounds.  Abdominal:     General: Abdomen is flat.     Palpations: Abdomen is soft.  Musculoskeletal:        General: No swelling or tenderness.  Skin:    Coloration: Skin is not jaundiced or pale.  Neurological:     Mental Status: She is alert. Mental status is at baseline.  Psychiatric:        Mood and Affect: Mood normal.        Behavior: Behavior normal.     Data Reviewed: I have personally reviewed following labs and imaging studies  CBC: Recent Labs  Lab 01/02/20 0245 01/03/20 0349 01/04/20 0334 01/05/20 0258 01/06/20 0411  WBC 1.8* 3.2* 5.0 4.5 5.6  NEUTROABS 1.4* 2.4 3.6 3.5 4.2  HGB 9.6* 9.1* 10.6* 9.2* 9.5*  HCT 28.9* 27.7* 33.2* 28.3* 29.5*  MCV 100.7* 102.2* 104.7* 101.8* 103.5*  PLT 139* 168 206 230 267   Basic Metabolic Panel: Recent Labs  Lab 01/02/20 0245 01/03/20 0349 01/04/20 0334 01/05/20 0258 01/06/20 0411  NA 139 142 142 140 140  K 3.8 4.0 4.1 4.3 4.0  CL 104 110 108 108 106  CO2 24 25 23 24 23   GLUCOSE 147* 132* 125* 166* 148*  BUN 17 26* 23 26* 27*  CREATININE 0.89 0.85 0.75 0.76 0.84  CALCIUM 8.5* 8.6* 9.1 9.4 9.8   GFR: Estimated Creatinine Clearance: 72.3 mL/min (by C-G formula based on SCr of 0.84 mg/dL). Liver Function Tests: Recent Labs  Lab 01/02/20 0245 01/03/20 0349  01/04/20 0334 01/05/20 0258 01/06/20 0411  AST 38 28 30 23 20   ALT 27 23 24 21 22   ALKPHOS 71 62 74  71 75  BILITOT 0.4 0.4 0.4 0.4 0.4  PROT 6.4* 6.0* 6.9 5.7* 5.8*  ALBUMIN 3.1* 2.8* 3.2* 2.7* 2.8*   No results for input(s): LIPASE, AMYLASE in the last 168 hours. No results for input(s): AMMONIA in the last 168 hours. Coagulation Profile: No results for input(s): INR, PROTIME in the last 168 hours. Cardiac Enzymes: No results for input(s): CKTOTAL, CKMB, CKMBINDEX, TROPONINI in the last 168 hours. BNP (last 3 results) No results for input(s): PROBNP in the last 8760 hours. HbA1C: No results for input(s): HGBA1C in the last 72 hours. CBG: Recent Labs  Lab 01/05/20 2214  GLUCAP 165*   Lipid Profile: No results for input(s): CHOL, HDL, LDLCALC, TRIG, CHOLHDL, LDLDIRECT in the last 72 hours. Thyroid Function Tests: No results for input(s): TSH, T4TOTAL, FREET4, T3FREE, THYROIDAB in the last 72 hours. Anemia Panel: Recent Labs    01/05/20 0258 01/06/20 0411  FERRITIN 102 88   Sepsis Labs: Recent Labs  Lab 01/01/20 1147  PROCALCITON <0.10  LATICACIDVEN 1.2    Recent Results (from the past 240 hour(s))  Blood Culture (routine x 2)     Status: None   Collection Time: 01/01/20 11:47 AM   Specimen: BLOOD  Result Value Ref Range Status   Specimen Description   Final    BLOOD RIGHT ANTECUBITAL Performed at [facility] [address], [place], [place] [address]    Special Requests   Final    BOTTLES DRAWN AEROBIC AND ANAEROBIC Blood Culture results may not be optimal due to an excessive volume of blood received in culture bottles Performed at [facility] [address] [place], [place] [address]    Culture   Final    NO GROWTH 5 DAYS Performed at [facility], [place] [address] [place], [place] [address]    Report Status 01/06/2020 FINAL  Final  Blood Culture (routine x 2)     Status: None   Collection Time: 01/01/20  11:52 AM   Specimen: BLOOD  Result Value Ref Range Status   Specimen Description   Final    BLOOD LEFT ANTECUBITAL Performed at [facility] [address], [place], [place] [address]    Special Requests   Final    BOTTLES DRAWN AEROBIC AND ANAEROBIC Blood Culture results may not be optimal due to an excessive volume of blood received in culture bottles Performed at [facility] [address], [place], [place] [address]    Culture   Final    NO GROWTH 5 DAYS Performed at [facility], [place] [address] [place], [place] [address]    Report Status 01/06/2020 FINAL  Final         Radiology Studies: CT ANGIO CHEST PE W OR WO CONTRAST  Result Date: 01/05/2020 CLINICAL DATA:  69 year old female with history of hypoxemia. Patient tested positive for COVID-19. Elevated D-dimer. EXAM: CT ANGIOGRAPHY CHEST WITH CONTRAST TECHNIQUE: Multidetector CT imaging of the chest was performed using the standard protocol during bolus administration of intravenous contrast. Multiplanar CT image reconstructions and MIPs were obtained to evaluate the vascular anatomy. CONTRAST:  159mL OMNIPAQUE IOHEXOL 350 MG/ML SOLN COMPARISON:  No priors. FINDINGS: Cardiovascular: No filling defects within the pulmonary arterial tree to suggest underlying pulmonary embolism. Heart size is normal. There is no significant pericardial fluid, thickening or pericardial calcification. Aortic atherosclerosis. No definite coronary artery calcifications. Mediastinum/Nodes: No pathologically enlarged mediastinal or hilar lymph nodes. Large hiatal hernia. No  axillary lymphadenopathy. Lungs/Pleura: Widespread but patchy areas of ground-glass attenuation and septal thickening noted throughout the lungs bilaterally, most evident throughout the mid to upper lungs, compatible with multilobar pneumonia. No pleural effusions. No definite suspicious appearing pulmonary nodules or masses are noted. Upper  Abdomen: Incompletely imaged low-attenuation lesion measuring at least 2.4 cm in the upper pole the left kidney, not characterized on today's examination, but statistically likely to represent a cyst. 1.3 cm low-attenuation lesion in segment 7 of the liver, compatible with a simple cyst. Musculoskeletal: There are no aggressive appearing lytic or blastic lesions noted in the visualized portions of the skeleton. Review of the MIP images confirms the above findings. IMPRESSION: 1. No evidence of pulmonary embolism. 2. The appearance of the lungs is most compatible with multilobar bilateral pneumonia, presumably from reported COVID-19 infection. 3. Aortic atherosclerosis. Aortic Atherosclerosis (ICD10-I70.0). Electronically Signed   By: Trudie Reed M.D.   On: 01/05/2020 17:29        Scheduled Meds: . albuterol  2 puff Inhalation QID  . vitamin C  500 mg Oral Daily  . aspirin EC  81 mg Oral Daily  . atorvastatin  20 mg Oral Daily  . dexamethasone (DECADRON) injection  6 mg Intravenous Q24H  . docusate sodium  100 mg Oral BID  . enoxaparin (LOVENOX) injection  40 mg Subcutaneous Q24H  . ferrous sulfate  325 mg Oral BID WC  . pantoprazole  40 mg Oral BID  . polyethylene glycol  17 g Oral Daily  . zinc sulfate  220 mg Oral Daily   Continuous Infusions: . sodium chloride 75 mL/hr at 01/05/20 2876     Time spent: 25 minutes with over 50% of the time coordinating the patient's care    Jae Dire, DO [facility] Pager [phone]  Call night coverage person covering after 7pm

## 2020-01-07 LAB — BASIC METABOLIC PANEL
Anion gap: 8 (ref 5–15)
BUN: 29 mg/dL — ABNORMAL HIGH (ref 8–23)
CO2: 28 mmol/L (ref 22–32)
Calcium: 9.8 mg/dL (ref 8.9–10.3)
Chloride: 105 mmol/L (ref 98–111)
Creatinine, Ser: 0.91 mg/dL (ref 0.44–1.00)
GFR calc Af Amer: >60 mL/min (ref >60)
GFR calc non Af Amer: >60 mL/min (ref >60)
Glucose, Bld: 140 mg/dL — ABNORMAL HIGH (ref 70–99)
Potassium: 3.7 mmol/L (ref 3.5–5.1)
Sodium: 141 mmol/L (ref 135–145)

## 2020-01-07 MED ORDER — DEXAMETHASONE 6 MG PO TABS: 6.0000 mg | ORAL_TABLET | Freq: Every day | ORAL | 0 refills | Status: AC

## 2020-01-07 NOTE — TOC Progression Note (Signed)
Transition of Care [facility]) - Progression Note    Patient Details  Name: Hailey Young MRN: [id number] Date of Birth: Mar 27, 1951  [facility] CM/SW Contact  Geni Bers, RN Phone Number: 01/07/2020, 12:03 PM  Clinical Narrative:    Pt states that she will not need HHPT at this present time. Explained to pt that she can call her PCP office to set up [facility] if she change her mind.    Expected Discharge Plan: Home/Self Care Barriers to Discharge: No Barriers Identified  Expected Discharge Plan and Services Expected Discharge Plan: Home/Self Care       Living arrangements for the past 2 months: Single Family Home                                       Social Determinants of Health (SDOH) Interventions    Readmission Risk Interventions No flowsheet data found.

## 2020-01-07 NOTE — Care Management Important Message (Signed)
Important Message  Patient Details IM Letter given to Ezekiel Ina RN Case Manager to present to the Patient Name: JALESHA PLOTZ MRN: [id number] Date of Birth: [birth year]   Medicare Important Message Given:  Yes     Caren Macadam 01/07/2020, 12:39 PM

## 2020-01-07 NOTE — Discharge Summary (Signed)
Physician Discharge Summary  Hailey Young IWP:[id number] DOB: 10-09-1951   PCP: Vevelyn Francois, NP  Admit date: 01/01/2020 Discharge date: 01/07/2020 Length of Stay: 6 days   Code Status: Full Code  Admitted From:  Home Discharged to:   [place]:  None  Equipment/Devices:  None Discharge Condition:  Stable  Recommendations for Outpatient Follow-up   1. Follow up with PCP in 1 week 2. If still tachycardic once out of acute Covid period,  Consider beta-blocker  Hospital Summary  This is a 69 year old female with history of hypertension, obesity admitted on 3/16 for COVID-19.  ED course:T-max 99.11F, mildly tachycardic with heart rate 90-1 09, respiratory rate 50-53, systolic blood pressure 976 to128. Labs show WBC 3.0, hemoglobin 10.9, hematocrit 33.4, platelet 138, sodium 139, potassium 3.5, chloride 100, bicarb 27, BUN 17, creatinine 1.2,calcium 9.0, glucose 112, procalcitonin less than 0.1, glucose 112, lactate 1.2, Covid POC positive. Chest x-ray showsright-sided infiltrates. Patient on 2 L O2, saturating well, also on contact/droplet precautions.   During hospitalization patient continued to require supplemental oxygen despite remdesivir and dexamethasone and was given 2 days of diuresis with improved O2 saturation.  Additionally she was seen on 3/21 by physical therapy due to generalized weakness initially recommending home health however the following day the patient was able to ambulate on her own on room air without any assistance and was discharged without home health.  She was discharged with 2 days of dexamethasone to complete 10 days steroids and had completed 5 days of remdesivir.  A & P   Principal Problem:   Pneumonia due to COVID-19 virus Active Problems:   Anemia, iron deficiency   Obesity   Arthritis   Acute respiratory failure with hypoxemia (HCC)   Pancytopenia (HCC)   AKI (acute kidney injury) ([place])    1. Acute hypoxic respiratory  failure secondary to COVID-19 with likely mild volume overload and atelectasis a. Completed 5 days remdesivir and 8 days dexamethasone, discharged on 2 more days of steroids for 10 days total b. Was not a candidate for Actemra given her severe neutropenia on admission c. Tolerating room air at rest and ambulation d. Continue vitamin C/zinc e. Improved with scheduled inhalers and incentive spirometry f. CTA negative for PE 2. Pancytopenia, improved.  likely Covid related a. HIV/HCV negative b. Pathology peripheral smear -pancytopenia 3. Hypertension, stable a. Start home HCTZ b. Consider addition of beta-blocker if patient is persistently tachycardic on outpatient evaluation 4. Asymptomatic sinus tachycardia a. Likely from improving underlying viral infection b. No treatment at this time 5. AKI resolved 6. Osteoarthritis 7. Obesity a. Follow-up outpatient    Consultants  . None  Procedures  . None  Antibiotics   Anti-infectives (From admission, onward)   Start     Dose/Rate Route Frequency Ordered Stop   01/05/20 1145  remdesivir 100 mg in sodium chloride 0.9 % 100 mL IVPB     100 mg 200 mL/hr over 30 Minutes Intravenous NOW 01/05/20 1131 01/05/20 1227   01/02/20 1000  remdesivir 100 mg in sodium chloride 0.9 % 100 mL IVPB     100 mg 200 mL/hr over 30 Minutes Intravenous Daily 01/01/20 1346 01/05/20 1201   01/01/20 1400  remdesivir 100 mg in sodium chloride 0.9 % 100 mL IVPB     100 mg 200 mL/hr over 30 Minutes Intravenous Every 1 hr x 2 01/01/20 1346 01/01/20 1843       Subjective  Patient seen and examined at bedside no acute  distress and resting comfortably.  No events overnight.  Tolerating diet. In good spirits and anticipating discharge.   Denies any chest pain, shortness of breath, fever, nausea, vomiting, urinary or bowel complaints. Otherwise ROS negative    Objective   Discharge Exam: Vitals:   01/06/20 2004 01/07/20 0536  BP: 130/78 123/86  Pulse:  100 87  Resp: 20 20  Temp: 98.9 F (37.2 C) 98.2 F (36.8 C)  SpO2: 96% 96%   Vitals:   01/06/20 1619 01/06/20 2004 01/07/20 0527 01/07/20 0536  BP:  130/78  123/86  Pulse:  100  87  Resp:  20  20  Temp:  98.9 F (37.2 C)  98.2 F (36.8 C)  TempSrc:  Oral  Oral  SpO2: 95% 96%  96%  Weight:   83.9 kg   Height:        Physical Exam Vitals and nursing note reviewed.  Constitutional:      Appearance: Normal appearance.  HENT:     Head: Normocephalic and atraumatic.  Eyes:     Conjunctiva/sclera: Conjunctivae normal.  Cardiovascular:     Rate and Rhythm: Normal rate and regular rhythm.  Pulmonary:     Effort: Pulmonary effort is normal.     Breath sounds: Normal breath sounds.  Abdominal:     General: Abdomen is flat.     Palpations: Abdomen is soft.  Musculoskeletal:        General: No swelling or tenderness.  Skin:    Coloration: Skin is not jaundiced or pale.  Neurological:     Mental Status: She is alert. Mental status is at baseline.  Psychiatric:        Mood and Affect: Mood normal.        Behavior: Behavior normal.       The results of significant diagnostics from this hospitalization (including imaging, microbiology, ancillary and laboratory) are listed below for reference.     Microbiology: Recent Results (from the past 240 hour(s))  Blood Culture (routine x 2)     Status: None   Collection Time: 01/01/20 11:47 AM   Specimen: BLOOD  Result Value Ref Range Status   Specimen Description   Final    BLOOD RIGHT ANTECUBITAL Performed at [facility] [address], [place], [place] [address]    Special Requests   Final    BOTTLES DRAWN AEROBIC AND ANAEROBIC Blood Culture results may not be optimal due to an excessive volume of blood received in culture bottles Performed at [facility] [address], [place], [place] [address]    Culture   Final    NO GROWTH 5 DAYS Performed at [facility], [place] [address], [place], [place] [address]    Report Status 01/06/2020 FINAL  Final  Blood Culture (routine x 2)     Status: None   Collection Time: 01/01/20 11:52 AM   Specimen: BLOOD  Result Value Ref Range Status   Specimen Description   Final    BLOOD LEFT ANTECUBITAL Performed at [facility] [address] [place], [place] [address]    Special Requests   Final    BOTTLES DRAWN AEROBIC AND ANAEROBIC Blood Culture results may not be optimal due to an excessive volume of blood received in culture bottles Performed at [facility] [address], [place], [place] [address]    Culture   Final    NO GROWTH 5 DAYS Performed at [facility], [address] [place], [place] [address]    Report Status 01/06/2020 FINAL  Final     Labs: BNP (last 3 results) Recent Labs    01/06/20 1030  BNP 353.6*   Basic Metabolic Panel: Recent Labs  Lab 01/03/20 0349 01/04/20 0334 01/05/20 0258 01/06/20 0411 01/07/20 0431  NA 142 142 140 140 141  K 4.0 4.1 4.3 4.0 3.7  CL 110 108 108 106 105  CO2 '25 23 24 23 28  '$ GLUCOSE 132* 125* 166* 148* 140*  BUN 26* 23 26* 27* 29*  CREATININE 0.85 0.75 0.76 0.84 0.91  CALCIUM 8.6* 9.1 9.4 9.8 9.8   Liver Function Tests: Recent Labs  Lab 01/02/20 0245 01/03/20 0349 01/04/20 0334 01/05/20 0258 01/06/20 0411  AST 38 '28 30 23 20  '$ ALT '27 23 24 21 22  '$ ALKPHOS 71 62 74 71 75  BILITOT 0.4 0.4 0.4 0.4 0.4  PROT 6.4* 6.0* 6.9 5.7* 5.8*  ALBUMIN 3.1* 2.8* 3.2* 2.7* 2.8*   No results for input(s): LIPASE, AMYLASE in the last 168 hours. No results for input(s): AMMONIA in the last 168 hours. CBC: Recent Labs  Lab 01/02/20 0245 01/03/20 0349 01/04/20 0334 01/05/20 0258 01/06/20 0411  WBC 1.8* 3.2* 5.0 4.5 5.6  NEUTROABS 1.4* 2.4 3.6 3.5 4.2  HGB 9.6* 9.1* 10.6* 9.2* 9.5*  HCT 28.9* 27.7* 33.2* 28.3* 29.5*  MCV 100.7* 102.2* 104.7* 101.8* 103.5*  PLT 139* 168 206 230 267   Cardiac Enzymes: No  results for input(s): CKTOTAL, CKMB, CKMBINDEX, TROPONINI in the last 168 hours. BNP: Invalid input(s): POCBNP CBG: Recent Labs  Lab 01/05/20 2214  GLUCAP 165*   D-Dimer Recent Labs    01/05/20 0258 01/06/20 0411  DDIMER 0.62* 0.34   Hgb A1c No results for input(s): HGBA1C in the last 72 hours. Lipid Profile No results for input(s): CHOL, HDL, LDLCALC, TRIG, CHOLHDL, LDLDIRECT in the last 72 hours. Thyroid function studies No results for input(s): TSH, T4TOTAL, T3FREE, THYROIDAB in the last 72 hours.  Invalid input(s): FREET3 Anemia work up Recent Labs    01/05/20 0258 01/06/20 0411  FERRITIN 102 88   Urinalysis    Component Value Date/Time   COLORURINE YELLOW 07/05/2015 0159   APPEARANCEUR CLEAR 07/05/2015 0159   LABSPEC 1.025 02/24/2017 1338   PHURINE 5.5 02/24/2017 1338   GLUCOSEU NEGATIVE 02/24/2017 [place] 02/24/2017 1338   BILIRUBINUR neg 08/31/2018 [place] 02/24/2017 1338   PROTEINUR Negative 08/31/2018 1527   PROTEINUR NEGATIVE 02/24/2017 1338   UROBILINOGEN 0.2 08/31/2018 1527   UROBILINOGEN 0.2 02/24/2017 1338   NITRITE neg 08/31/2018 1527   NITRITE NEGATIVE 02/24/2017 1338   LEUKOCYTESUR Small (1+) (A) 08/31/2018 1527   Sepsis Labs Invalid input(s): PROCALCITONIN,  WBC,  LACTICIDVEN Microbiology Recent Results (from the past 240 hour(s))  Blood Culture (routine x 2)     Status: None   Collection Time: 01/01/20 11:47 AM   Specimen: BLOOD  Result Value Ref Range Status   Specimen Description   Final    BLOOD RIGHT ANTECUBITAL Performed at [facility], [place] [address] [place], [place] [address]    Special Requests   Final    BOTTLES DRAWN AEROBIC AND ANAEROBIC Blood Culture results may not be optimal due to an excessive volume of blood received in culture bottles Performed at [facility] [address], [place], [place] [address]    Culture   Final    NO  GROWTH 5  DAYS Performed at [facility], [place] [address], [place], [place] [address]    Report Status 01/06/2020 FINAL  Final  Blood Culture (routine x 2)     Status: None   Collection Time: 01/01/20 11:52 AM   Specimen: BLOOD  Result Value Ref Range Status   Specimen Description   Final    BLOOD LEFT ANTECUBITAL Performed at [facility] [address], [place], [place] [address]    Special Requests   Final    BOTTLES DRAWN AEROBIC AND ANAEROBIC Blood Culture results may not be optimal due to an excessive volume of blood received in culture bottles Performed at [facility] [address], [place], [place] [address]    Culture   Final    NO GROWTH 5 DAYS Performed at [facility], [place] [address], [place], [place] [address]    Report Status 01/06/2020 FINAL  Final    Discharge Instructions     Discharge Instructions    Diet - low sodium heart healthy   Complete by: As directed    Discharge instructions   Complete by: As directed    You were seen in the hospital for COVID-19.  Upon discharge: -Take Decadron 6 mg daily for the next 2 days  -You are still contagious with COVID-19 and should self isolate at home.  Isolation can be discontinued when the following criteria are met:  At least 10 days have passed since symptoms first appeared AND You are at least 1 day (24 hours) since resolution of fever without the use of any fever reducing medications (Tylenol, Motrin, ibuprofen, etc.) AND There is improvement in your symptoms (ex. shortness of breath, cough, etc.)  If you have any questions do not hesitate to contact your primary care physician or return to the ED if worsening symptoms.   Increase activity slowly   Complete by: As directed      Allergies as of 01/07/2020   No Known Allergies     Medication List    STOP taking these medications   imiquimod 5 % cream Commonly known as: Aldara   meloxicam 7.5 MG  tablet Commonly known as: MOBIC     TAKE these medications   aspirin EC 81 MG tablet Take 1 tablet (81 mg total) by mouth daily.   atorvastatin 20 MG tablet Commonly known as: LIPITOR TAKE 1 TABLET BY MOUTH DAILY   dexamethasone 6 MG tablet Commonly known as: DECADRON Take 1 tablet (6 mg total) by mouth daily for 2 days. Start taking on: January 08, 2020   ferrous sulfate 325 (65 FE) MG tablet Take 1 tablet (325 mg total) by mouth 2 (two) times daily with a meal.   hydrochlorothiazide 12.5 MG tablet Commonly known as: HYDRODIURIL TAKE 1 TABLET BY MOUTH DAILY   Menthol (Topical Analgesic) 4 % Gel Commonly known as: Biofreeze Apply 1 each topically every 6 (six) hours as needed.       No Known Allergies  Time coordinating discharge: Over 30 minutes   SIGNED:   Harold Hedge, D.O. [facility] Pager: [phone]  01/07/2020, 12:39 PM

## 2020-01-31 ENCOUNTER — Ambulatory Visit (INDEPENDENT_AMBULATORY_CARE_PROVIDER_SITE_OTHER): Payer: Medicare Other | Admitting: Nurse Practitioner

## 2020-01-31 ENCOUNTER — Encounter: Payer: Self-pay | Admitting: Nurse Practitioner

## 2020-01-31 ENCOUNTER — Other Ambulatory Visit: Payer: Self-pay

## 2020-01-31 DIAGNOSIS — U071 COVID-19: Secondary | ICD-10-CM

## 2020-01-31 DIAGNOSIS — Z09 Encounter for follow-up examination after completed treatment for conditions other than malignant neoplasm: Secondary | ICD-10-CM | POA: Diagnosis not present

## 2020-01-31 DIAGNOSIS — J1282 Pneumonia due to coronavirus disease 2019: Secondary | ICD-10-CM

## 2020-01-31 NOTE — Patient Instructions (Signed)

## 2020-01-31 NOTE — Progress Notes (Signed)
Virtual Visit via Telephone Note  I connected with Hailey Young on 01/31/20 at  1:00 PM EDT by telephone and verified that I am speaking with the correct person using two identifiers.   I discussed the limitations, risks, security and privacy concerns of performing an evaluation and management service by telephone and the availability of in person appointments. I also discussed with the patient that there may be a patient responsible charge related to this service. The patient expressed understanding and agreed to proceed.   History of Present Illness: Hailey Young had a positive COVID 19 lab greater than 28 days ago in a separate encounter. She was having diffulty breathing. She has not fully recovered fully. She admits that she is not 100%.  She admits that she is not able to multitask and she did prior to her hospitalization.  She is trying to pace herself.  She does have some dyspnea on exertion on.  She noted yesterday after going out.  She denies headache, dizziness, visual changes, chest pain, nausea, vomiting or any edema.     Observations/Objective: Patient is able to talk in full complete sentences without shortness of breath  Assessment and Plan:Visit Diagnosis: 1. Pneumonia due to COVID-19 virus   2. Hospital discharge follow-up     Follow Up Instructions: She will continue with her current regimen.  She will call to make a follow-up appointment.  I discussed the assessment and treatment plan with the patient. The patient was provided an opportunity to ask questions and all were answered. The patient agreed with the plan and demonstrated an understanding of the instructions.   The patient was advised to call back or seek an in-person evaluation if the symptoms worsen or if the condition fails to improve as anticipated.  I provided 9 minutes of non-face-to-face time during this encounter.   Barbette Merino, NP

## 2020-03-05 ENCOUNTER — Ambulatory Visit (INDEPENDENT_AMBULATORY_CARE_PROVIDER_SITE_OTHER): Payer: Medicare Other | Admitting: Orthopaedic Surgery

## 2020-03-05 ENCOUNTER — Ambulatory Visit: Payer: Self-pay

## 2020-03-05 ENCOUNTER — Other Ambulatory Visit: Payer: Self-pay

## 2020-03-05 VITALS — Ht 67.0 in | Wt 184.0 lb

## 2020-03-05 DIAGNOSIS — M1712 Unilateral primary osteoarthritis, left knee: Secondary | ICD-10-CM

## 2020-03-05 DIAGNOSIS — M1711 Unilateral primary osteoarthritis, right knee: Secondary | ICD-10-CM

## 2020-03-05 MED ORDER — LIDOCAINE HCL 1 % IJ SOLN
3.0000 mL | INTRAMUSCULAR | Status: AC | PRN
  Administered 2020-03-05: 3 mL

## 2020-03-05 MED ORDER — METHYLPREDNISOLONE ACETATE 40 MG/ML IJ SUSP
40.0000 mg | INTRAMUSCULAR | Status: AC | PRN
  Administered 2020-03-05: 40 mg via INTRA_ARTICULAR

## 2020-03-05 NOTE — Progress Notes (Signed)
Office Visit Note   Patient: Hailey Young           Date of Birth: 1951-01-26           MRN: [id number] Visit Date: 03/05/2020              Requested by: Barbette Merino, NP [address] [place],  [place] [address] PCP: Barbette Merino, NP   Assessment & Plan: Visit Diagnoses:  1. Unilateral primary osteoarthritis, left knee   2. Unilateral primary osteoarthritis, right knee     Plan: She understands that steroids can have a detrimental effect on the body.  She understands the risk and benefits of these and we did place him in both knees today which she tolerated well.  I again talked about her knee x-rays with her in detail and about knee replacement surgery.  She is still not interested in this and she understands will let her body talk her into it if he gets to the point where nothing is helping.  All questions and concerns were answered addressed.  Follow-up is as needed.  Follow-Up Instructions: Return if symptoms worsen or fail to improve.   Orders:  Orders Placed This Encounter  Procedures  . Large Joint Inj: R knee  . Large Joint Inj: L knee  . XR Knee 1-2 Views Left  . XR Knee 1-2 Views Right   No orders of the defined types were placed in this encounter.     Procedures: Large Joint Inj: R knee on 03/05/2020 10:08 AM Indications: diagnostic evaluation and pain Details: 22 G 1.5 in needle, superolateral approach  Arthrogram: No  Medications: 3 mL lidocaine 1 %; 40 mg methylPREDNISolone acetate 40 MG/ML Outcome: tolerated well, no immediate complications Procedure, treatment alternatives, risks and benefits explained, specific risks discussed. Consent was given by the patient. Immediately prior to procedure a time out was called to verify the correct patient, procedure, equipment, support staff and site/side marked as required. Patient was prepped and draped in the usual sterile fashion.   Large Joint Inj: L knee on 03/05/2020 10:08 AM Indications:  diagnostic evaluation and pain Details: 22 G 1.5 in needle, superolateral approach  Arthrogram: No  Medications: 3 mL lidocaine 1 %; 40 mg methylPREDNISolone acetate 40 MG/ML Outcome: tolerated well, no immediate complications Procedure, treatment alternatives, risks and benefits explained, specific risks discussed. Consent was given by the patient. Immediately prior to procedure a time out was called to verify the correct patient, procedure, equipment, support staff and site/side marked as required. Patient was prepped and draped in the usual sterile fashion.       Clinical Data: No additional findings.   Subjective: Chief Complaint  Patient presents with  . Left Knee - Pain  . Right Knee - Pain  The patient is well-known to [place].  She has been dealing with bilateral knee pain for years now.  Is been worsening on and off and have some swelling off and on.  She has had injections over the years as well.  Going up and down stairs hurts her knees.  If she has been sitting for too long gets up to stand and walk her knees hurt.  She does wear knee sleeves.  She has had numerous steroid injections in her knees.  She has had no acute change in her medical status.  Her knee pain is daily and is definitely affecting her mobility, her quality of life and her actives daily living.  She  is not interested in knee replacement surgery and is requesting steroid injections again today in her knees.  She last had these about 4 months ago.  She is not a diabetic.  HPI  Review of Systems She currently denies any headache, chest pain, shortness of breath, fever, chills, nausea, vomiting  Objective: Vital Signs: Ht 5\' 7"  (1.702 m)   Wt 184 lb (83.5 kg)   BMI 28.82 kg/m   Physical Exam She is alert and orient x3 and in no acute distress Ortho Exam Examination of both knees shows varus malalignment with a mild effusion.  Both knees have global tenderness of the arc of motion with patellofemoral  crepitation. Specialty Comments:  No specialty comments available.  Imaging: XR Knee 1-2 Views Left  Result Date: 03/05/2020 2 views left knee show end-stage arthritic changes with varus malalignment, joint space narrowing and osteophytes.  XR Knee 1-2 Views Right  Result Date: 03/05/2020 2 views of the right knee show severe arthritic changes with varus malalignment.  This involves all 3 compartments.    PMFS History: Patient Active Problem List   Diagnosis Date Noted  . Pneumonia due to COVID-19 virus 01/01/2020  . Acute respiratory failure with hypoxemia ([place]) 01/01/2020  . Pancytopenia ([place]) 01/01/2020  . AKI (acute kidney injury) ([place]) 01/01/2020  . Chronic pain of left knee 05/05/2017  . Chronic pain of right knee 05/05/2017  . Unilateral primary osteoarthritis, left knee 05/05/2017  . Unilateral primary osteoarthritis, right knee 02/02/2017  . Elevated blood-pressure reading without diagnosis of hypertension 12/08/2016  . Obesity (BMI 30-39.9) 11/23/2016  . Metabolic syndrome 77/82/4235  . Arthritis 11/23/2016  . Anemia, iron deficiency 07/05/2015  . Obesity 07/05/2015  . Anemia 07/04/2015  . Symptomatic anemia 07/04/2015  . 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 Perianal condylomata     No family history on file.  Past Surgical History:  Procedure Laterality Date  . ablation of perianal warts     Social History   Occupational History  . Not on file  Tobacco Use  . Smoking status: Never Smoker  . Smokeless tobacco: Never Used  Substance and Sexual Activity  . Alcohol use: No  . Drug use: No  . Sexual activity: Not on file

## 2020-03-27 ENCOUNTER — Telehealth: Payer: Self-pay | Admitting: Nurse Practitioner

## 2020-03-27 NOTE — Telephone Encounter (Signed)
Pt in today with her son and questioned if she needed to have some labs work completed.

## 2020-06-26 ENCOUNTER — Encounter: Payer: Self-pay | Admitting: Nurse Practitioner

## 2020-06-26 ENCOUNTER — Other Ambulatory Visit: Payer: Self-pay

## 2020-06-26 ENCOUNTER — Ambulatory Visit (INDEPENDENT_AMBULATORY_CARE_PROVIDER_SITE_OTHER): Payer: Medicare Other | Admitting: Nurse Practitioner

## 2020-06-26 DIAGNOSIS — G8929 Other chronic pain: Secondary | ICD-10-CM

## 2020-06-26 DIAGNOSIS — Z1231 Encounter for screening mammogram for malignant neoplasm of breast: Secondary | ICD-10-CM

## 2020-06-26 DIAGNOSIS — I10 Essential (primary) hypertension: Secondary | ICD-10-CM

## 2020-06-26 DIAGNOSIS — M25561 Pain in right knee: Secondary | ICD-10-CM

## 2020-06-26 DIAGNOSIS — M25562 Pain in left knee: Secondary | ICD-10-CM | POA: Diagnosis not present

## 2020-06-26 HISTORY — DX: Essential (primary) hypertension: I10

## 2020-06-26 LAB — POCT URINALYSIS DIPSTICK
Bilirubin, UA: NEGATIVE
Blood, UA: NEGATIVE
Glucose, UA: NEGATIVE
Ketones, UA: NEGATIVE
Nitrite, UA: NEGATIVE
Protein, UA: NEGATIVE
Spec Grav, UA: 1.025 (ref 1.010–1.025)
Urobilinogen, UA: 0.2 E.U./dL
pH, UA: 5.5 (ref 5.0–8.0)

## 2020-06-26 MED ORDER — HYDROCHLOROTHIAZIDE 25 MG PO TABS
25.0000 mg | ORAL_TABLET | Freq: Every day | ORAL | 3 refills | Status: DC
Start: 2020-06-26 — End: 2020-09-10

## 2020-06-26 NOTE — Progress Notes (Signed)
[facility] [address] [place], [place]  [address] Phone:  [phone]   Fax:  [phone]   Established Patient Office Visit  Subjective:  Patient ID: Hailey Young, female    DOB: [birth year]  Age: 69 y.o. MRN: [id number]  CC:  Chief Complaint  Patient presents with  . Follow-up    Pt states shr has no question or concerns.    HPI Hailey Young presents for follow up. She  has a past medical history of Perianal condylomata.   Hypertension Patient is here for follow-up of elevated blood pressure. She is not exercising and is not adherent to a low-salt diet. Blood pressure is not monitored at home. Cardiac symptoms: none. Patient denies chest pain, dyspnea, exertional chest pressure/discomfort, fatigue, irregular heart beat, lower extremity edema, palpitations and syncope. Cardiovascular risk factors: advanced age (older than 66 for men, 41 for women), dyslipidemia and sedentary lifestyle. Use of agents associated with hypertension: NSAIDS. History of target organ damage: none.  Past Medical History:  Diagnosis Date  . Perianal condylomata     Past Surgical History:  Procedure Laterality Date  . ablation of perianal warts      History reviewed. No pertinent family history.  Social History   Socioeconomic History  . Marital status: Single    Spouse name: Not on file  . Number of children: Not on file  . Years of education: Not on file  . Highest education level: Not on file  Occupational History  . Not on file  Tobacco Use  . Smoking status: Never Smoker  . Smokeless tobacco: Never Used  Vaping Use  . Vaping Use: Never used  Substance and Sexual Activity  . Alcohol use: No  . Drug use: No  . Sexual activity: Not on file  Other Topics Concern  . Not on file  Social History Narrative  . Not on file   Social Determinants of Health   Financial Resource Strain:   . Difficulty of Paying Living Expenses: Not on file  Food  Insecurity:   . Worried About Programme researcher, broadcasting/film/video in the Last Year: Not on file  . Ran Out of Food in the Last Year: Not on file  Transportation Needs:   . Lack of Transportation (Medical): Not on file  . Lack of Transportation (Non-Medical): Not on file  Physical Activity:   . Days of Exercise per Week: Not on file  . Minutes of Exercise per Session: Not on file  Stress:   . Feeling of Stress : Not on file  Social Connections:   . Frequency of Communication with Friends and Family: Not on file  . Frequency of Social Gatherings with Friends and Family: Not on file  . Attends Religious Services: Not on file  . Active Member of Clubs or Organizations: Not on file  . Attends Banker Meetings: Not on file  . Marital Status: Not on file  Intimate Partner Violence:   . Fear of Current or Ex-Partner: Not on file  . Emotionally Abused: Not on file  . Physically Abused: Not on file  . Sexually Abused: Not on file    Outpatient Medications Prior to Visit  Medication Sig Dispense Refill  . aspirin EC 81 MG tablet Take 1 tablet (81 mg total) by mouth daily. 30 tablet 11  . atorvastatin (LIPITOR) 20 MG tablet TAKE 1 TABLET BY MOUTH DAILY 90 tablet 3  . ferrous sulfate 325 (65 FE) MG  tablet Take 1 tablet (325 mg total) by mouth 2 (two) times daily with a meal. 60 tablet 3  . hydrochlorothiazide (HYDRODIURIL) 12.5 MG tablet TAKE 1 TABLET BY MOUTH DAILY 90 tablet 3  . ibuprofen (ADVIL) 800 MG tablet Take 800 mg by mouth every 6 (six) hours as needed.    . Menthol, Topical Analgesic, (BIOFREEZE) 4 % GEL Apply 1 each topically every 6 (six) hours as needed. (Patient not taking: Reported on 01/01/2020) 1 Tube 0   No facility-administered medications prior to visit.    No Known Allergies  ROS Review of Systems    Objective:    Physical Exam Constitutional:      Appearance: She is not ill-appearing, toxic-appearing or diaphoretic.  HENT:     Head: Normocephalic and atraumatic.      Nose: Nose normal.     Mouth/Throat:     Mouth: Mucous membranes are moist.  Cardiovascular:     Rate and Rhythm: Normal rate and regular rhythm.     Pulses: Normal pulses.     Heart sounds: Normal heart sounds.  Pulmonary:     Effort: Pulmonary effort is normal.     Breath sounds: Normal breath sounds.  Abdominal:     Palpations: Abdomen is soft.  Musculoskeletal:        General: Tenderness present.     Cervical back: Normal range of motion.     Comments: Knees   Skin:    General: Skin is warm and dry.     Capillary Refill: Capillary refill takes less than 2 seconds.  Neurological:     General: No focal deficit present.     Mental Status: She is alert and oriented to person, place, and time.  Psychiatric:        Mood and Affect: Mood normal.     BP (!) 155/95 (BP Location: Left Arm, Patient Position: Sitting, Cuff Size: Normal)   Pulse 81   Temp 98.1 F (36.7 C)   Resp 17   Wt 184 lb 9.6 oz (83.7 kg)   SpO2 99%   BMI 28.91 kg/m  Wt Readings from Last 3 Encounters:  06/26/20 184 lb 9.6 oz (83.7 kg)  03/05/20 184 lb (83.5 kg)  01/07/20 184 lb 15.5 oz (83.9 kg)     Health Maintenance Due  Topic Date Due  . DEXA SCAN  Never done  . MAMMOGRAM  02/18/2019    There are no preventive care reminders to display for this patient.  Lab Results  Component Value Date   TSH 1.685 07/05/2015   Lab Results  Component Value Date   WBC 5.6 01/06/2020   HGB 9.5 (L) 01/06/2020   HCT 29.5 (L) 01/06/2020   MCV 103.5 (H) 01/06/2020   PLT 267 01/06/2020   Lab Results  Component Value Date   NA 141 01/07/2020   K 3.7 01/07/2020   CO2 28 01/07/2020   GLUCOSE 140 (H) 01/07/2020   BUN 29 (H) 01/07/2020   CREATININE 0.91 01/07/2020   BILITOT 0.4 01/06/2020   ALKPHOS 75 01/06/2020   AST 20 01/06/2020   ALT 22 01/06/2020   PROT 5.8 (L) 01/06/2020   ALBUMIN 2.8 (L) 01/06/2020   CALCIUM 9.8 01/07/2020   ANIONGAP 8 01/07/2020   Lab Results  Component Value Date    CHOL 171 03/16/2018   Lab Results  Component Value Date   HDL 52 03/16/2018   Lab Results  Component Value Date   LDLCALC 91 03/16/2018   Lab  Results  Component Value Date   TRIG 186 (H) 01/01/2020   Lab Results  Component Value Date   CHOLHDL 3.3 03/16/2018   Lab Results  Component Value Date   HGBA1C 5.2 11/23/2016      Assessment & Plan:   Problem List Items Addressed This Visit      Cardiovascular and Mediastinum   Essential hypertension Encouraged on going compliance with current medication regimen.Increased  HCTZ 25mg  daily Encouraged home monitoring and recording BP <130/80 Eating a heart-healthy diet with less salt Encouraged regular physical activity  Recommend Weight loss    Relevant Medications   hydrochlorothiazide (HYDRODIURIL) 25 MG tablet   Other Relevant Orders   Urinalysis Dipstick (Completed)   POC Glucose (CBG)     Other   Chronic pain of left knee follow up with ortho as scheduled   Relevant Medications   ibuprofen (ADVIL) 800 MG tablet   Chronic pain of right knee   Relevant Medications   ibuprofen (ADVIL) 800 MG tablet    Other Visit Diagnoses    Screening mammogram, encounter for       Relevant Orders   MM DIGITAL SCREENING BILATERAL      Meds ordered this encounter  Medications  . hydrochlorothiazide (HYDRODIURIL) 25 MG tablet    Sig: Take 1 tablet (25 mg total) by mouth daily.    Dispense:  90 tablet    Refill:  3    Order Specific Question:   Supervising Provider    Answer:   Quentin Angst    Follow-up: Return in about 6 weeks (around 08/07/2020).    08/09/2020, NP

## 2020-06-26 NOTE — Patient Instructions (Addendum)
Health Maintenance, Female Adopting a healthy lifestyle and getting preventive care are important in promoting health and wellness. Ask your health care provider about:  The right schedule for you to have regular tests and exams.  Things you can do on your own to prevent diseases and keep yourself healthy. What should I know about diet, weight, and exercise? Eat a healthy diet   Eat a diet that includes plenty of vegetables, fruits, low-fat dairy products, and lean protein.  Do not eat a lot of foods that are high in solid fats, added sugars, or sodium. Maintain a healthy weight Body mass index (BMI) is used to identify weight problems. It estimates body fat based on height and weight. Your health care provider can help determine your BMI and help you achieve or maintain a healthy weight. Get regular exercise Get regular exercise. This is one of the most important things you can do for your health. Most adults should:  Exercise for at least 150 minutes each week. The exercise should increase your heart rate and make you sweat (moderate-intensity exercise).  Do strengthening exercises at least twice a week. This is in addition to the moderate-intensity exercise.  Spend less time sitting. Even light physical activity can be beneficial. Watch cholesterol and blood lipids Have your blood tested for lipids and cholesterol at 68 years of age, then have this test every 5 years. Have your cholesterol levels checked more often if:  Your lipid or cholesterol levels are high.  You are older than 68 years of age.  You are at high risk for heart disease. What should I know about cancer screening? Depending on your health history and family history, you may need to have cancer screening at various ages. This may include screening for:  Breast cancer.  Cervical cancer.  Colorectal cancer.  Skin cancer.  Lung cancer. What should I know about heart disease, diabetes, and high blood  pressure? Blood pressure and heart disease  High blood pressure causes heart disease and increases the risk of stroke. This is more likely to develop in people who have high blood pressure readings, are of African descent, or are overweight.  Have your blood pressure checked: ? Every 3-5 years if you are 18-39 years of age. ? Every year if you are 40 years old or older. Diabetes Have regular diabetes screenings. This checks your fasting blood sugar level. Have the screening done:  Once every three years after age 40 if you are at a normal weight and have a low risk for diabetes.  More often and at a younger age if you are overweight or have a high risk for diabetes. What should I know about preventing infection? Hepatitis B If you have a higher risk for hepatitis B, you should be screened for this virus. Talk with your health care provider to find out if you are at risk for hepatitis B infection. Hepatitis C Testing is recommended for:  Everyone born from 1945 through 1965.  Anyone with known risk factors for hepatitis C. Sexually transmitted infections (STIs)  Get screened for STIs, including gonorrhea and chlamydia, if: ? You are sexually active and are younger than 68 years of age. ? You are older than 68 years of age and your health care provider tells you that you are at risk for this type of infection. ? Your sexual activity has changed since you were last screened, and you are at increased risk for chlamydia or gonorrhea. Ask your health care provider if   you are at risk.  Ask your health care provider about whether you are at high risk for HIV. Your health care provider may recommend a prescription medicine to help prevent HIV infection. If you choose to take medicine to prevent HIV, you should first get tested for HIV. You should then be tested every 3 months for as long as you are taking the medicine. Pregnancy  If you are about to stop having your period (premenopausal) and  you may become pregnant, seek counseling before you get pregnant.  Take 400 to 800 micrograms (mcg) of folic acid every day if you become pregnant.  Ask for birth control (contraception) if you want to prevent pregnancy. Osteoporosis and menopause Osteoporosis is a disease in which the bones lose minerals and strength with aging. This can result in bone fractures. If you are 49 years old or older, or if you are at risk for osteoporosis and fractures, ask your health care provider if you should:  Be screened for bone loss.  Take a calcium or vitamin D supplement to lower your risk of fractures.  Be given hormone replacement therapy (HRT) to treat symptoms of menopause. Follow these instructions at home: Lifestyle  Do not use any products that contain nicotine or tobacco, such as cigarettes, e-cigarettes, and chewing tobacco. If you need help quitting, ask your health care provider.  Do not use street drugs.  Do not share needles.  Ask your health care provider for help if you need support or information about quitting drugs. Alcohol use  Do not drink alcohol if: ? Your health care provider tells you not to drink. ? You are pregnant, may be pregnant, or are planning to become pregnant.  If you drink alcohol: ? Limit how much you use to 0-1 drink a day. ? Limit intake if you are breastfeeding.  Be aware of how much alcohol is in your drink. In the [place] one drink equals one 12 oz bottle of beer (355 mL), one 5 oz glass of wine (148 mL), or one 1 oz glass of hard liquor (44 mL). General instructions  Schedule regular health, dental, and eye exams.  Stay current with your vaccines.  Tell your health care provider if: ? You often feel depressed. ? You have ever been abused or do not feel safe at home. Summary  Adopting a healthy lifestyle and getting preventive care are important in promoting health and wellness.  Follow your health care provider's instructions about healthy  diet, exercising, and getting tested or screened for diseases.  Follow your health care provider's instructions on monitoring your cholesterol and blood pressure. This information is not intended to replace advice given to you by your health care provider. Make sure you discuss any questions you have with your health care provider. Document Revised: 09/27/2018 Document Reviewed: 09/27/2018 Elsevier Patient Education  2020 [employer].   Managing Your Hypertension Hypertension is commonly called high blood pressure. This is when the force of your blood pressing against the walls of your arteries is too strong. Arteries are blood vessels that carry blood from your heart throughout your body. Hypertension forces the heart to work harder to pump blood, and may cause the arteries to become narrow or stiff. Having untreated or uncontrolled hypertension can cause heart attack, stroke, kidney disease, and other problems. What are blood pressure readings? A blood pressure reading consists of a higher number over a lower number. Ideally, your blood pressure should be below 120/80. The first ("top") number is  called the systolic pressure. It is a measure of the pressure in your arteries as your heart beats. The second ("bottom") number is called the diastolic pressure. It is a measure of the pressure in your arteries as the heart relaxes. What does my blood pressure reading mean? Blood pressure is classified into four stages. Based on your blood pressure reading, your health care provider may use the following stages to determine what type of treatment you need, if any. Systolic pressure and diastolic pressure are measured in a unit called mm Hg. Normal Systolic pressure: below 120. Diastolic pressure: below 80. Elevated Systolic pressure: 120-129. Diastolic pressure: below 80. Hypertension stage 1 Systolic pressure: 130-139. Diastolic pressure: 80-89. Hypertension stage 2 Systolic pressure: 140 or  above. Diastolic pressure: 90 or above. What health risks are associated with hypertension? Managing your hypertension is an important responsibility. Uncontrolled hypertension can lead to: A heart attack. A stroke. A weakened blood vessel (aneurysm). Heart failure. Kidney damage. Eye damage. Metabolic syndrome. Memory and concentration problems. What changes can I make to manage my hypertension? Hypertension can be managed by making lifestyle changes and possibly by taking medicines. Your health care provider will help you make a plan to bring your blood pressure within a normal range. Eating and drinking  Eat a diet that is high in fiber and potassium, and low in salt (sodium), added sugar, and fat. An example eating plan is called the DASH (Dietary Approaches to Stop Hypertension) diet. To eat this way: Eat plenty of fresh fruits and vegetables. Try to fill half of your plate at each meal with fruits and vegetables. Eat whole grains, such as whole wheat pasta, brown rice, or whole grain bread. Fill about one quarter of your plate with whole grains. Eat low-fat diary products. Avoid fatty cuts of meat, processed or cured meats, and poultry with skin. Fill about one quarter of your plate with lean proteins such as fish, chicken without skin, beans, eggs, and tofu. Avoid premade and processed foods. These tend to be higher in sodium, added sugar, and fat. Reduce your daily sodium intake. Most people with hypertension should eat less than 1,500 mg of sodium a day. Limit alcohol intake to no more than 1 drink a day for nonpregnant women and 2 drinks a day for men. One drink equals 12 oz of beer, 5 oz of wine, or 1 oz of hard liquor. Lifestyle Work with your health care provider to maintain a healthy body weight, or to lose weight. Ask what an ideal weight is for you. Get at least 30 minutes of exercise that causes your heart to beat faster (aerobic exercise) most days of the week. Activities  may include walking, swimming, or biking. Include exercise to strengthen your muscles (resistance exercise), such as weight lifting, as part of your weekly exercise routine. Try to do these types of exercises for 30 minutes at least 3 days a week. Do not use any products that contain nicotine or tobacco, such as cigarettes and e-cigarettes. If you need help quitting, ask your health care provider. Control any long-term (chronic) conditions you have, such as high cholesterol or diabetes. Monitoring Monitor your blood pressure at home as told by your health care provider. Your personal target blood pressure may vary depending on your medical conditions, your age, and other factors. Have your blood pressure checked regularly, as often as told by your health care provider. Working with your health care provider Review all the medicines you take with your health care  provider because there may be side effects or interactions. Talk with your health care provider about your diet, exercise habits, and other lifestyle factors that may be contributing to hypertension. Visit your health care provider regularly. Your health care provider can help you create and adjust your plan for managing hypertension. Will I need medicine to control my blood pressure? Your health care provider may prescribe medicine if lifestyle changes are not enough to get your blood pressure under control, and if: Your systolic blood pressure is 130 or higher. Your diastolic blood pressure is 80 or higher. Take medicines only as told by your health care provider. Follow the directions carefully. Blood pressure medicines must be taken as prescribed. The medicine does not work as well when you skip doses. Skipping doses also puts you at risk for problems. Contact a health care provider if: You think you are having a reaction to medicines you have taken. You have repeated (recurrent) headaches. You feel dizzy. You have swelling in your  ankles. You have trouble with your vision. Get help right away if: You develop a severe headache or confusion. You have unusual weakness or numbness, or you feel faint. You have severe pain in your chest or abdomen. You vomit repeatedly. You have trouble breathing. Summary Hypertension is when the force of blood pumping through your arteries is too strong. If this condition is not controlled, it may put you at risk for serious complications. Your personal target blood pressure may vary depending on your medical conditions, your age, and other factors. For most people, a normal blood pressure is less than 120/80. Hypertension is managed by lifestyle changes, medicines, or both. Lifestyle changes include weight loss, eating a healthy, low-sodium diet, exercising more, and limiting alcohol. This information is not intended to replace advice given to you by your health care provider. Make sure you discuss any questions you have with your health care provider. Document Revised: 01/26/2019 Document Reviewed: 09/01/2016 Elsevier Patient Education  2020 [employer].

## 2020-07-28 ENCOUNTER — Ambulatory Visit (INDEPENDENT_AMBULATORY_CARE_PROVIDER_SITE_OTHER): Payer: Medicare Other | Admitting: Orthopaedic Surgery

## 2020-07-28 ENCOUNTER — Encounter: Payer: Self-pay | Admitting: Orthopaedic Surgery

## 2020-07-28 DIAGNOSIS — M1711 Unilateral primary osteoarthritis, right knee: Secondary | ICD-10-CM | POA: Diagnosis not present

## 2020-07-28 DIAGNOSIS — M25561 Pain in right knee: Secondary | ICD-10-CM | POA: Diagnosis not present

## 2020-07-28 DIAGNOSIS — M25562 Pain in left knee: Secondary | ICD-10-CM

## 2020-07-28 DIAGNOSIS — G8929 Other chronic pain: Secondary | ICD-10-CM | POA: Diagnosis not present

## 2020-07-28 DIAGNOSIS — M1712 Unilateral primary osteoarthritis, left knee: Secondary | ICD-10-CM | POA: Diagnosis not present

## 2020-07-28 MED ORDER — LIDOCAINE HCL 1 % IJ SOLN
3.0000 mL | INTRAMUSCULAR | Status: AC | PRN
  Administered 2020-07-28: 3 mL

## 2020-07-28 MED ORDER — METHYLPREDNISOLONE ACETATE 40 MG/ML IJ SUSP
40.0000 mg | INTRAMUSCULAR | Status: AC | PRN
  Administered 2020-07-28: 40 mg via INTRA_ARTICULAR

## 2020-07-28 NOTE — Progress Notes (Signed)
Office Visit Note   Patient: Hailey Young           Date of Birth: 1951-10-12           MRN: [id number] Visit Date: 07/28/2020              Requested by: Barbette Merino, NP [address] [place],  [place] [address] PCP: Barbette Merino, NP   Assessment & Plan: Visit Diagnoses:  1. Unilateral primary osteoarthritis, left knee   2. Unilateral primary osteoarthritis, right knee   3. Chronic pain of left knee   4. Chronic pain of right knee     Plan: Per the patient's request, I did provide a steroid injection of both knees today without difficulty.  All questions and concerns were answered and addressed.  She knows to wait at least over 3 months before steroid injections again.  Follow-up as otherwise as needed.  Follow-Up Instructions: Return if symptoms worsen or fail to improve.   Orders:  Orders Placed This Encounter  Procedures  . Large Joint Inj  . Large Joint Inj   No orders of the defined types were placed in this encounter.     Procedures: Large Joint Inj: R knee on 07/28/2020 10:41 AM Indications: diagnostic evaluation and pain Details: 22 G 1.5 in needle, superolateral approach  Arthrogram: No  Medications: 3 mL lidocaine 1 %; 40 mg methylPREDNISolone acetate 40 MG/ML Outcome: tolerated well, no immediate complications Procedure, treatment alternatives, risks and benefits explained, specific risks discussed. Consent was given by the patient. Immediately prior to procedure a time out was called to verify the correct patient, procedure, equipment, support staff and site/side marked as required. Patient was prepped and draped in the usual sterile fashion.   Large Joint Inj: L knee on 07/28/2020 10:41 AM Indications: diagnostic evaluation and pain Details: 22 G 1.5 in needle, superolateral approach  Arthrogram: No  Medications: 3 mL lidocaine 1 %; 40 mg methylPREDNISolone acetate 40 MG/ML Outcome: tolerated well, no immediate complications Procedure,  treatment alternatives, risks and benefits explained, specific risks discussed. Consent was given by the patient. Immediately prior to procedure a time out was called to verify the correct patient, procedure, equipment, support staff and site/side marked as required. Patient was prepped and draped in the usual sterile fashion.       Clinical Data: No additional findings.   Subjective: Chief Complaint  Patient presents with  . Left Knee - Follow-up  . Right Knee - Follow-up  The patient is well-known to [place].  She has known significant end-stage arthritis of both her knees.  She is 69 years old and is reluctant to proceed with any type of knee replacement surgery.  We last injected a steroid in both knees in mid May of this year.  Since it is been over 4 months she is requesting steroid injections again today.  She has had no other acute change in her medical status.  Both knees hurt equally bad for her and are definitely affecting her mobility, her quality of life and her activities day living.  She is still adverse to knee replacement surgery.  HPI  Review of Systems She currently denies any headache, chest pain, shortness of breath, fever, chills, nausea, vomiting  Objective: Vital Signs: There were no vitals taken for this visit.  Physical Exam She is alert and orient x3 and in no acute distress Ortho Exam Examination of both knees show mild effusion.  Both knees have varus  malalignment and significant patellofemoral crepitation.  Both knees have good range of motion but are painful throughout the arc of motion. Specialty Comments:  No specialty comments available.  Imaging: No results found.   PMFS History: Patient Active Problem List   Diagnosis Date Noted  . Essential hypertension 06/26/2020  . Pneumonia due to COVID-19 virus 01/01/2020  . Acute respiratory failure with hypoxemia (HCC) 01/01/2020  . Pancytopenia (HCC) 01/01/2020  . AKI (acute kidney injury) (HCC)  01/01/2020  . Chronic pain of left knee 05/05/2017  . Chronic pain of right knee 05/05/2017  . Unilateral primary osteoarthritis, left knee 05/05/2017  . Unilateral primary osteoarthritis, right knee 02/02/2017  . Elevated blood-pressure reading without diagnosis of hypertension 12/08/2016  . Obesity (BMI 30-39.9) 11/23/2016  . Metabolic syndrome 11/23/2016  . Arthritis 11/23/2016  . Anemia, iron deficiency 07/05/2015  . Obesity 07/05/2015  . Anemia 07/04/2015  . Symptomatic anemia 07/04/2015  . 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 Perianal condylomata     History reviewed. No pertinent family history.  Past Surgical History:  Procedure Laterality Date  . ablation of perianal warts     Social History   Occupational History  . Not on file  Tobacco Use  . Smoking status: Never Smoker  . Smokeless tobacco: Never Used  Vaping Use  . Vaping Use: Never used  Substance and Sexual Activity  . Alcohol use: No  . Drug use: No  . Sexual activity: Not on file

## 2020-07-31 ENCOUNTER — Ambulatory Visit: Payer: Medicare Other | Admitting: Nurse Practitioner

## 2020-08-06 ENCOUNTER — Ambulatory Visit: Payer: Medicare Other | Admitting: Orthopaedic Surgery

## 2020-08-19 ENCOUNTER — Other Ambulatory Visit: Payer: Self-pay | Admitting: Internal Medicine

## 2020-08-19 DIAGNOSIS — E785 Hyperlipidemia, unspecified: Secondary | ICD-10-CM

## 2020-09-09 ENCOUNTER — Telehealth: Payer: Self-pay | Admitting: Nurse Practitioner

## 2020-09-10 ENCOUNTER — Other Ambulatory Visit: Payer: Self-pay | Admitting: Nurse Practitioner

## 2020-09-10 MED ORDER — HYDROCHLOROTHIAZIDE 25 MG PO TABS: 25.0000 mg | ORAL_TABLET | Freq: Every day | ORAL | 3 refills | Status: DC

## 2020-09-10 NOTE — Telephone Encounter (Signed)
Please call Hailey Young and see when she was informed to take HCTZ 25mg  2 tabs. I do not have any indication for this. If her BP is elevated then she needs to be evaluated.  Thank you

## 2020-09-10 NOTE — Telephone Encounter (Signed)
Spoke w/ pt and had her look at bottle, pt was unaware that last script filled was for 25mg  tablets and not 12.5mg  tablets so she continued taking 2 as previously advised when she was on 12.5mg  tabs. Pt denies any dizziness or blurred vision, attempted to explain importance of need to come in for an appt but pt declines to come in for appt or BP check, has monitor at home and will monitor but will be out of medication on Sunday, pls advise

## 2020-09-10 NOTE — Telephone Encounter (Signed)
New refill sent

## 2020-09-10 NOTE — Telephone Encounter (Signed)
Pt aware and will follow-up w/ her pharmacy

## 2020-11-18 ENCOUNTER — Ambulatory Visit (INDEPENDENT_AMBULATORY_CARE_PROVIDER_SITE_OTHER): Payer: Medicare Other | Admitting: Orthopaedic Surgery

## 2020-11-18 ENCOUNTER — Encounter: Payer: Self-pay | Admitting: Orthopaedic Surgery

## 2020-11-18 DIAGNOSIS — M1711 Unilateral primary osteoarthritis, right knee: Secondary | ICD-10-CM | POA: Diagnosis not present

## 2020-11-18 DIAGNOSIS — M1712 Unilateral primary osteoarthritis, left knee: Secondary | ICD-10-CM

## 2020-11-18 MED ORDER — LIDOCAINE HCL 1 % IJ SOLN
0.5000 mL | INTRAMUSCULAR | Status: AC | PRN
  Administered 2020-11-18: .5 mL

## 2020-11-18 MED ORDER — METHYLPREDNISOLONE ACETATE 40 MG/ML IJ SUSP
40.0000 mg | INTRAMUSCULAR | Status: AC | PRN
  Administered 2020-11-18: 40 mg via INTRA_ARTICULAR

## 2020-11-18 NOTE — Progress Notes (Signed)
   Procedure Note  Patient: Hailey Young             Date of Birth: Sep 29, 1951           MRN: [id number]             Visit Date: 11/18/2020  HPI: Mrs. Mynhier is 1 month 3 Dr. Magnus Ivan service.  She has known significant end-stage arthritis of both knees.  She comes in with a requesting cortisone injections both knees.  She states the last injections given 07/28/2020 helped until recently.  She denies any recent injury to either knee.  Denies any recent fevers chills or recent vaccines.  Physical exam: Bilateral knees good range of motion.  No abnormal warmth erythema.  Procedures: Visit Diagnoses:  1. Unilateral primary osteoarthritis, left knee   2. Unilateral primary osteoarthritis, right knee     Large Joint Inj: bilateral knee on 11/18/2020 9:33 AM Indications: pain Details: 22 G 1.5 in needle, anterolateral approach  Arthrogram: No  Medications (Right): 0.5 mL lidocaine 1 %; 40 mg methylPREDNISolone acetate 40 MG/ML Medications (Left): 0.5 mL lidocaine 1 %; 40 mg methylPREDNISolone acetate 40 MG/ML Outcome: tolerated well, no immediate complications Procedure, treatment alternatives, risks and benefits explained, specific risks discussed. Consent was given by the patient. Immediately prior to procedure a time out was called to verify the correct patient, procedure, equipment, support staff and site/side marked as required. Patient was prepped and draped in the usual sterile fashion.     Plan: She understands to wait at least 3 months before repeating the steroid injections.  She will follow-up with [place] as needed.  Questions were encouraged and answered.

## 2021-03-18 ENCOUNTER — Encounter: Payer: Self-pay | Admitting: Orthopaedic Surgery

## 2021-03-18 ENCOUNTER — Telehealth: Payer: Self-pay

## 2021-03-18 ENCOUNTER — Ambulatory Visit (INDEPENDENT_AMBULATORY_CARE_PROVIDER_SITE_OTHER): Payer: Medicare Other | Admitting: Orthopaedic Surgery

## 2021-03-18 ENCOUNTER — Other Ambulatory Visit: Payer: Self-pay

## 2021-03-18 DIAGNOSIS — M1711 Unilateral primary osteoarthritis, right knee: Secondary | ICD-10-CM | POA: Diagnosis not present

## 2021-03-18 DIAGNOSIS — M1712 Unilateral primary osteoarthritis, left knee: Secondary | ICD-10-CM

## 2021-03-18 MED ORDER — METHYLPREDNISOLONE ACETATE 40 MG/ML IJ SUSP
40.0000 mg | INTRAMUSCULAR | Status: AC | PRN
  Administered 2021-03-18: 40 mg via INTRA_ARTICULAR

## 2021-03-18 MED ORDER — LIDOCAINE HCL 1 % IJ SOLN
5.0000 mL | INTRAMUSCULAR | Status: AC | PRN
  Administered 2021-03-18: 5 mL

## 2021-03-18 MED ORDER — LIDOCAINE HCL 1 % IJ SOLN
0.5000 mL | INTRAMUSCULAR | Status: AC | PRN
  Administered 2021-03-18: .5 mL

## 2021-03-18 NOTE — Telephone Encounter (Signed)
Bilateral gel injection  

## 2021-03-18 NOTE — Progress Notes (Signed)
   Procedure Note  Patient: Hailey Young             Date of Birth: 03-11-1951           MRN: [id number]             Visit Date: 03/18/2021 HPI: Ms. Mostafa is well-known to Dr. Eliberto Ivory service comes in today requesting cortisone injections both knees.  Last injections 08/18/2021 gave her very little relief.  She notes she is having swelling in her right knee.  She is asking to have lubricant injections in her knees.  She has had no prior gel injections..  She has known osteoarthritis both knees.  Denies any new injury to either knee.  Notes swelling of the right knee.  Review of systems negative for fevers, chills, recent vaccines.  Bilateral knees good range of motion both knees no abnormal warmth erythema.  Slight effusion right knee.  No effusion left knee.  Procedures: Visit Diagnoses:  1. Unilateral primary osteoarthritis, right knee   2. Unilateral primary osteoarthritis, left knee     Large Joint Inj: bilateral knee on 03/18/2021 10:57 AM Indications: pain Details: 22 G 1.5 in needle, anterolateral approach  Arthrogram: No  Medications (Right): 5 mL lidocaine 1 %; 40 mg methylPREDNISolone acetate 40 MG/ML Aspirate (Right): 40 mL yellow Medications (Left): 0.5 mL lidocaine 1 %; 40 mg methylPREDNISolone acetate 40 MG/ML Outcome: tolerated well, no immediate complications Procedure, treatment alternatives, risks and benefits explained, specific risks discussed. Consent was given by the patient. Immediately prior to procedure a time out was called to verify the correct patient, procedure, equipment, support staff and site/side marked as required. Patient was prepped and draped in the usual sterile fashion.    Plan: We will try to gain approval for supplemental injections both knees have patient back once these are available.  Right knee was wrapped with an Ace bandage which she will leave on until this evening.  Discussed icing both knees at least for 15 to 20 minutes today.   Questions encouraged and answered.

## 2021-03-18 NOTE — Telephone Encounter (Signed)
Noted  

## 2021-03-26 ENCOUNTER — Telehealth: Payer: Self-pay | Admitting: Nurse Practitioner

## 2021-03-26 NOTE — Telephone Encounter (Signed)
Pt was call VM was left to schedule appointment for a AWV

## 2021-04-17 ENCOUNTER — Telehealth: Payer: Self-pay

## 2021-04-17 NOTE — Telephone Encounter (Signed)
VOB submitted for Durolane, bilateral knee. Pending BV. 

## 2021-04-22 ENCOUNTER — Telehealth: Payer: Self-pay

## 2021-04-22 NOTE — Telephone Encounter (Signed)
Approved for Durolane, bilateral knee. Buy & Bill Patient will be responsible for 20% OOP. No Co-pay No PA required  Appt. 05/04/2021 with Dr. Magnus Ivan

## 2021-05-04 ENCOUNTER — Other Ambulatory Visit: Payer: Self-pay

## 2021-05-04 ENCOUNTER — Encounter: Payer: Self-pay | Admitting: Orthopaedic Surgery

## 2021-05-04 ENCOUNTER — Ambulatory Visit (INDEPENDENT_AMBULATORY_CARE_PROVIDER_SITE_OTHER): Payer: Medicare Other | Admitting: Orthopaedic Surgery

## 2021-05-04 VITALS — Ht 66.0 in | Wt 184.0 lb

## 2021-05-04 DIAGNOSIS — M1712 Unilateral primary osteoarthritis, left knee: Secondary | ICD-10-CM | POA: Diagnosis not present

## 2021-05-04 DIAGNOSIS — M1711 Unilateral primary osteoarthritis, right knee: Secondary | ICD-10-CM | POA: Diagnosis not present

## 2021-05-04 MED ORDER — SODIUM HYALURONATE 60 MG/3ML IX PRSY
60.0000 mg | PREFILLED_SYRINGE | INTRA_ARTICULAR | Status: AC | PRN
  Administered 2021-05-04: 60 mg via INTRA_ARTICULAR

## 2021-05-04 NOTE — Progress Notes (Signed)
   Procedure Note  Patient: Hailey Young             Date of Birth: 1950/12/09           MRN: [id number]             Visit Date: 05/04/2021  Procedures: Visit Diagnoses:  1. Unilateral primary osteoarthritis, right knee   2. Unilateral primary osteoarthritis, left knee     Large Joint Inj: bilateral knee on 05/04/2021 10:29 AM Indications: diagnostic evaluation and pain Details: 22 G 1.5 in needle, superolateral approach  Arthrogram: No  Medications (Right): 60 mg Sodium Hyaluronate 60 MG/3ML Medications (Left): 60 mg Sodium Hyaluronate 60 MG/3ML Outcome: tolerated well, no immediate complications Procedure, treatment alternatives, risks and benefits explained, specific risks discussed. Consent was given by the patient. Immediately prior to procedure a time out was called to verify the correct patient, procedure, equipment, support staff and site/side marked as required. Patient was prepped and draped in the usual sterile fashion.   She comes in today for scheduled hyaluronic acid injections with Durolane to treat the pain from osteoarthritis which is quite significant of both knees.  She understands why we are trying injections such as this.  She has tried and failed other forms of conservative treatment including steroid injections.  Examination of both knees shows moderate effusion.  I was able to aspirate 40 cc of yellow fluid off both knees and then placed Durolane in both knees which she tolerated well.  We had a long and thorough discussion about follow-up being as needed and if things worsen to let us know.  We can always repeat these injections in 6 months or consider knee replacement surgery at some point.  She is always 70.  All questions and concerns were answered and addressed.

## 2021-05-27 ENCOUNTER — Ambulatory Visit (INDEPENDENT_AMBULATORY_CARE_PROVIDER_SITE_OTHER): Payer: Medicare Other | Admitting: Orthopaedic Surgery

## 2021-05-27 DIAGNOSIS — G8929 Other chronic pain: Secondary | ICD-10-CM | POA: Diagnosis not present

## 2021-05-27 DIAGNOSIS — M25562 Pain in left knee: Secondary | ICD-10-CM

## 2021-05-27 DIAGNOSIS — M1711 Unilateral primary osteoarthritis, right knee: Secondary | ICD-10-CM | POA: Diagnosis not present

## 2021-05-27 DIAGNOSIS — M25561 Pain in right knee: Secondary | ICD-10-CM | POA: Diagnosis not present

## 2021-05-27 DIAGNOSIS — M1712 Unilateral primary osteoarthritis, left knee: Secondary | ICD-10-CM | POA: Diagnosis not present

## 2021-05-27 NOTE — Progress Notes (Signed)
The patient is well-known to [place].  She is 70 years old and has severe arthritis in both her knees.  We last placed hyaluronic acid in both knees just last month on July 18.  That has not really helped her at all and she feels an increase in fluid on both knees.  She is requesting aspirations of both knees today.  I talked her in length about knee replacement surgery.  She has a son who is autistic who she takes care of and said she will not be able to proceed with any type of surgery.  Both knees are assessed today in both knees have a large effusion.  I placed lidocaine in both knees and aspirated at least 40 to 50 cc of yellow fluid off of both knees.  She has varus malalignment of both knees and global tenderness and pain.  We can see her back in 6 weeks to consider repeat aspirations of both knees and a steroid injection of both knees at that visit.  There is really nothing else that I can recommend other than activity modification combined with quad training exercises and over-the-counter anti-inflammatories and Tylenol.

## 2021-06-04 ENCOUNTER — Telehealth: Payer: Self-pay

## 2021-06-04 NOTE — Telephone Encounter (Signed)
Med Refill  Hydrochlorothiazide Atorvastatin

## 2021-06-05 ENCOUNTER — Other Ambulatory Visit: Payer: Self-pay | Admitting: Nurse Practitioner

## 2021-06-05 DIAGNOSIS — E785 Hyperlipidemia, unspecified: Secondary | ICD-10-CM

## 2021-06-05 MED ORDER — HYDROCHLOROTHIAZIDE 25 MG PO TABS: 25.0000 mg | ORAL_TABLET | Freq: Every day | ORAL | 3 refills | Status: DC

## 2021-06-05 MED ORDER — ATORVASTATIN CALCIUM 20 MG PO TABS: 20.0000 mg | ORAL_TABLET | Freq: Every day | ORAL | 3 refills | Status: DC

## 2021-07-04 ENCOUNTER — Telehealth: Payer: Self-pay

## 2021-07-04 NOTE — Telephone Encounter (Signed)
Called pt to get AWV scheduled. Pt answered, but we lost service. Called back and went to VM, lm with clinic number if she wants to schedule AWV.

## 2021-07-13 ENCOUNTER — Ambulatory Visit (INDEPENDENT_AMBULATORY_CARE_PROVIDER_SITE_OTHER): Payer: Medicare Other

## 2021-07-13 ENCOUNTER — Other Ambulatory Visit: Payer: Self-pay

## 2021-07-13 DIAGNOSIS — Z0000 Encounter for general adult medical examination without abnormal findings: Secondary | ICD-10-CM | POA: Diagnosis not present

## 2021-07-13 NOTE — Progress Notes (Signed)
Subjective:   Hailey Young is a 70 y.o. female who presents for an Initial Medicare Annual Wellness Visit.  I connected with  Hailey Young on 07/13/21 by audio enabled telemedicine application and verified that I am speaking with the correct person using two identifiers.   I discussed the limitations of evaluation and management by telemedicine. The patient expressed understanding and agreed to proceed.  Location of Patient: Home Location of Provider: Office  List any persons and their roles That are participating in the visit with the patient. Hailey Franklin, LPN   Hailey Young , Thank you for taking time to come for your Medicare Wellness Visit. I appreciate your ongoing commitment to your health goals. Please review the following plan we discussed and let me know if I can assist you in the future.   These are the goals we discussed:  Goals   None     This is a list of the screening recommended for you and due dates:  Health Maintenance  Topic Date Due   COVID-19 Vaccine (1) Never done   Colon Cancer Screening  Never done   Zoster (Shingles) Vaccine (1 of 2) Never done   Mammogram  02/18/2019   DEXA scan (bone density measurement)  09/10/2021*   Tetanus Vaccine  11/23/2026   Hepatitis C Screening: USPSTF Recommendation to screen - Ages 18-79 yo.  Completed   HPV Vaccine  Aged Out  *Topic was postponed. The date shown is not the original due date.     Review of Systems    Defer to PCP       Objective:    There were no vitals filed for this visit. There is no height or weight on file to calculate BMI.  Advanced Directives 01/01/2020 01/01/2020 05/20/2017 02/24/2017 11/23/2016 11/11/2016 07/05/2015  Does Patient Have a Medical Advance Directive? No No No No No No No  Would patient like information on creating a medical advance directive? No - Patient declined - - - - - No - patient declined information    Current Medications (verified) Outpatient Encounter  Medications as of 07/13/2021  Medication Sig   aspirin EC 81 MG tablet Take 1 tablet (81 mg total) by mouth daily.   atorvastatin (LIPITOR) 20 MG tablet Take 1 tablet (20 mg total) by mouth daily.   ferrous sulfate 325 (65 FE) MG tablet Take 1 tablet (325 mg total) by mouth 2 (two) times daily with a meal.   hydrochlorothiazide (HYDRODIURIL) 25 MG tablet Take 1 tablet (25 mg total) by mouth daily.   ibuprofen (ADVIL) 800 MG tablet Take 800 mg by mouth every 6 (six) hours as needed.   Menthol, Topical Analgesic, (BIOFREEZE) 4 % GEL Apply 1 each topically every 6 (six) hours as needed. (Patient not taking: No sig reported)   No facility-administered encounter medications on file as of 07/13/2021.    Allergies (verified) Patient has no known allergies.   History: Past Medical History:  Diagnosis Date   Perianal condylomata    Past Surgical History:  Procedure Laterality Date   ablation of perianal warts     No family history on file. Social History   Socioeconomic History   Marital status: Single    Spouse name: Not on file   Number of children: Not on file   Years of education: Not on file   Highest education level: Not on file  Occupational History   Not on file  Tobacco Use   Smoking status: Never  Smokeless tobacco: Never  Vaping Use   Vaping Use: Never used  Substance and Sexual Activity   Alcohol use: No   Drug use: No   Sexual activity: Not on file  Other Topics Concern   Not on file  Social History Narrative   Not on file   Social Determinants of Health   Financial Resource Strain: Not on file  Food Insecurity: Not on file  Transportation Needs: Not on file  Physical Activity: Not on file  Stress: Not on file  Social Connections: Not on file    Tobacco Counseling Counseling given: Not Answered   Clinical Intake:                 Diabetic?No         Activities of Daily Living No flowsheet data found.  Patient Care Team: Barbette Merino, NP as PCP - General (Adult Health Nurse Practitioner)  Indicate any recent Medical Services you may have received from other than [facility] providers in the past year (date may be approximate).     Assessment:   This is a routine wellness examination for Hailey Young.  Hearing/Vision screen No results found.  Dietary issues and exercise activities discussed:     Goals Addressed   None   Depression Screen PHQ 2/9 Scores 06/26/2020 01/31/2020 08/31/2018 03/09/2018 08/19/2017 05/20/2017 05/12/2017  PHQ - 2 Score 0 0 0 0 0 0 0  Exception Documentation Medical reason - - - - - -    Fall Risk Fall Risk  06/26/2020 01/31/2020 08/31/2018 03/09/2018 08/19/2017  Falls in the past year? 0 0 0 No No  Number falls in past yr: 0 - - - -  Injury with Fall? 0 - - - -    FALL RISK PREVENTION PERTAINING TO THE HOME:  Any stairs in or around the home? No  If so, are there any without handrails? No  Home free of loose throw rugs in walkways, pet beds, electrical cords, etc? Yes  Adequate lighting in your home to reduce risk of falls? Yes   ASSISTIVE DEVICES UTILIZED TO PREVENT FALLS:  Life alert? No  Use of a cane, walker or w/c? No  Grab bars in the bathroom? No  Shower chair or bench in shower? No  Elevated toilet seat or a handicapped toilet? No   TIMED UP AND GO:  Was the test performed?  N/A .  Length of time to ambulate 10 feet: N/A sec.     Cognitive Function:        Immunizations Immunization History  Administered Date(s) Administered   Pneumococcal Conjugate-13 03/09/2018   Tdap 11/23/2016    TDAP status: Up to date  Flu Vaccine status: Due, Education has been provided regarding the importance of this vaccine. Advised may receive this vaccine at local pharmacy or Health Dept. Aware to provide a copy of the vaccination record if obtained from local pharmacy or Health Dept. Verbalized acceptance and understanding.  Pneumococcal vaccine status: Due, Education has been  provided regarding the importance of this vaccine. Advised may receive this vaccine at local pharmacy or Health Dept. Aware to provide a copy of the vaccination record if obtained from local pharmacy or Health Dept. Verbalized acceptance and understanding.  Covid-19 vaccine status: Information provided on how to obtain vaccines.   Qualifies for Shingles Vaccine? Yes   Zostavax completed No   Shingrix Completed?: No.    Education has been provided regarding the importance of this vaccine. Patient has been advised  to call insurance company to determine out of pocket expense if they have not yet received this vaccine. Advised may also receive vaccine at local pharmacy or Health Dept. Verbalized acceptance and understanding.  Screening Tests Health Maintenance  Topic Date Due   COVID-19 Vaccine (1) Never done   COLONOSCOPY (Pts 45-49yrs Insurance coverage will need to be confirmed)  Never done   Zoster Vaccines- Shingrix (1 of 2) Never done   MAMMOGRAM  02/18/2019   DEXA SCAN  09/10/2021 (Originally 04/08/2017)   TETANUS/TDAP  11/23/2026   Hepatitis C Screening  Completed   HPV VACCINES  Aged Out    Health Maintenance  Health Maintenance Due  Topic Date Due   COVID-19 Vaccine (1) Never done   COLONOSCOPY (Pts 45-53yrs Insurance coverage will need to be confirmed)  Never done   Zoster Vaccines- Shingrix (1 of 2) Never done   MAMMOGRAM  02/18/2019    Colorectal cancer screening: Referral to GI placed Today. Pt aware the office will call re: appt. Pt had a positive Cologuard on 09/18/2018. Referral was placed at that time but pt did not make appt.   Mammogram status: Ordered Today. Pt provided with contact info and advised to call to schedule appt.   Bone Density status: Ordered Today. Pt provided with contact info and advised to call to schedule appt.  Lung Cancer Screening: (Low Dose CT Chest recommended if Age 57-80 years, 30 pack-year currently smoking OR have quit w/in 15years.)  does not qualify.   Lung Cancer Screening Referral: N/A  Additional Screening:  Hepatitis C Screening: does qualify; Completed No  Vision Screening: Recommended annual ophthalmology exams for early detection of glaucoma and other disorders of the eye. Is the patient up to date with their annual eye exam?  No  Who is the provider or what is the name of the office in which the patient attends annual eye exams? Pt does not have a provider. States she will call due to her and her daughter both need to go. Her daughter is her transportation.  If pt is not established with a provider, would they like to be referred to a provider to establish care? No .   Dental Screening: Recommended annual dental exams for proper oral hygiene  Community Resource Referral / Chronic Care Management: CRR required this visit?  No   CCM required this visit?  No      Plan:     I have personally reviewed and noted the following in the patient's chart:   Medical and social history Use of alcohol, tobacco or illicit drugs  Current medications and supplements including opioid prescriptions. Patient is not currently taking opioid prescriptions. Functional ability and status Nutritional status Physical activity Advanced directives List of other physicians Hospitalizations, surgeries, and ER visits in previous 12 months Vitals Screenings to include cognitive, depression, and falls Referrals and appointments  In addition, I have reviewed and discussed with patient certain preventive protocols, quality metrics, and best practice recommendations. A written personalized care plan for preventive services as well as general preventive health recommendations were provided to patient.     Hailey Franklin, LPN   06/04/2992   Nurse Notes: Non Face to Face 45 min appt.   Ms. Carducci , Thank you for taking time to come for your Medicare Wellness Visit. I appreciate your ongoing commitment to your health goals.  Please review the following plan we discussed and let me know if I can assist you in the future.  These are the goals we discussed:  Goals   None     This is a list of the screening recommended for you and due dates:  Health Maintenance  Topic Date Due   COVID-19 Vaccine (1) Never done   Colon Cancer Screening  Never done   Zoster (Shingles) Vaccine (1 of 2) Never done   Mammogram  02/18/2019   DEXA scan (bone density measurement)  09/10/2021*   Tetanus Vaccine  11/23/2026   Hepatitis C Screening: USPSTF Recommendation to screen - Ages 18-79 yo.  Completed   HPV Vaccine  Aged Out  *Topic was postponed. The date shown is not the original due date.

## 2021-07-13 NOTE — Patient Instructions (Signed)
Health Maintenance, Female Adopting a healthy lifestyle and getting preventive care are important in promoting health and wellness. Ask your health care provider about: The right schedule for you to have regular tests and exams. Things you can do on your own to prevent diseases and keep yourself healthy. What should I know about diet, weight, and exercise? Eat a healthy diet  Eat a diet that includes plenty of vegetables, fruits, low-fat dairy products, and lean protein. Do not eat a lot of foods that are high in solid fats, added sugars, or sodium. Maintain a healthy weight Body mass index (BMI) is used to identify weight problems. It estimates body fat based on height and weight. Your health care provider can help determine your BMI and help you achieve or maintain a healthy weight. Get regular exercise Get regular exercise. This is one of the most important things you can do for your health. Most adults should: Exercise for at least 150 minutes each week. The exercise should increase your heart rate and make you sweat (moderate-intensity exercise). Do strengthening exercises at least twice a week. This is in addition to the moderate-intensity exercise. Spend less time sitting. Even light physical activity can be beneficial. Watch cholesterol and blood lipids Have your blood tested for lipids and cholesterol at 69 years of age, then have this test every 5 years. Have your cholesterol levels checked more often if: Your lipid or cholesterol levels are high. You are older than 69 years of age. You are at high risk for heart disease. What should I know about cancer screening? Depending on your health history and family history, you may need to have cancer screening at various ages. This may include screening for: Breast cancer. Cervical cancer. Colorectal cancer. Skin cancer. Lung cancer. What should I know about heart disease, diabetes, and high blood pressure? Blood pressure and heart  disease High blood pressure causes heart disease and increases the risk of stroke. This is more likely to develop in people who have high blood pressure readings, are of African descent, or are overweight. Have your blood pressure checked: Every 3-5 years if you are 18-39 years of age. Every year if you are 40 years old or older. Diabetes Have regular diabetes screenings. This checks your fasting blood sugar level. Have the screening done: Once every three years after age 40 if you are at a normal weight and have a low risk for diabetes. More often and at a younger age if you are overweight or have a high risk for diabetes. What should I know about preventing infection? Hepatitis B If you have a higher risk for hepatitis B, you should be screened for this virus. Talk with your health care provider to find out if you are at risk for hepatitis B infection. Hepatitis C Testing is recommended for: Everyone born from 1945 through 1965. Anyone with known risk factors for hepatitis C. Sexually transmitted infections (STIs) Get screened for STIs, including gonorrhea and chlamydia, if: You are sexually active and are younger than 69 years of age. You are older than 69 years of age and your health care provider tells you that you are at risk for this type of infection. Your sexual activity has changed since you were last screened, and you are at increased risk for chlamydia or gonorrhea. Ask your health care provider if you are at risk. Ask your health care provider about whether you are at high risk for HIV. Your health care provider may recommend a prescription medicine   to help prevent HIV infection. If you choose to take medicine to prevent HIV, you should first get tested for HIV. You should then be tested every 3 months for as long as you are taking the medicine. Pregnancy If you are about to stop having your period (premenopausal) and you may become pregnant, seek counseling before you get  pregnant. Take 400 to 800 micrograms (mcg) of folic acid every day if you become pregnant. Ask for birth control (contraception) if you want to prevent pregnancy. Osteoporosis and menopause Osteoporosis is a disease in which the bones lose minerals and strength with aging. This can result in bone fractures. If you are 65 years old or older, or if you are at risk for osteoporosis and fractures, ask your health care provider if you should: Be screened for bone loss. Take a calcium or vitamin D supplement to lower your risk of fractures. Be given hormone replacement therapy (HRT) to treat symptoms of menopause. Follow these instructions at home: Lifestyle Do not use any products that contain nicotine or tobacco, such as cigarettes, e-cigarettes, and chewing tobacco. If you need help quitting, ask your health care provider. Do not use street drugs. Do not share needles. Ask your health care provider for help if you need support or information about quitting drugs. Alcohol use Do not drink alcohol if: Your health care provider tells you not to drink. You are pregnant, may be pregnant, or are planning to become pregnant. If you drink alcohol: Limit how much you use to 0-1 drink a day. Limit intake if you are breastfeeding. Be aware of how much alcohol is in your drink. In the [place] one drink equals one 12 oz bottle of beer (355 mL), one 5 oz glass of wine (148 mL), or one 1 oz glass of hard liquor (44 mL). General instructions Schedule regular health, dental, and eye exams. Stay current with your vaccines. Tell your health care provider if: You often feel depressed. You have ever been abused or do not feel safe at home. Summary Adopting a healthy lifestyle and getting preventive care are important in promoting health and wellness. Follow your health care provider's instructions about healthy diet, exercising, and getting tested or screened for diseases. Follow your health care provider's  instructions on monitoring your cholesterol and blood pressure. This information is not intended to replace advice given to you by your health care provider. Make sure you discuss any questions you have with your health care provider. Document Revised: 12/12/2020 Document Reviewed: 09/27/2018 Elsevier Patient Education  2022 Elsevier Inc.  

## 2021-07-23 ENCOUNTER — Ambulatory Visit (INDEPENDENT_AMBULATORY_CARE_PROVIDER_SITE_OTHER): Payer: Medicare Other | Admitting: Physician Assistant

## 2021-07-23 ENCOUNTER — Encounter: Payer: Self-pay | Admitting: Physician Assistant

## 2021-07-23 DIAGNOSIS — M1711 Unilateral primary osteoarthritis, right knee: Secondary | ICD-10-CM

## 2021-07-23 DIAGNOSIS — M1712 Unilateral primary osteoarthritis, left knee: Secondary | ICD-10-CM

## 2021-07-23 MED ORDER — LIDOCAINE HCL 1 % IJ SOLN
4.0000 mL | INTRAMUSCULAR | Status: AC | PRN
  Administered 2021-07-23: 4 mL

## 2021-07-23 MED ORDER — METHYLPREDNISOLONE ACETATE 40 MG/ML IJ SUSP
40.0000 mg | INTRAMUSCULAR | Status: AC | PRN
  Administered 2021-07-23: 40 mg via INTRA_ARTICULAR

## 2021-07-23 NOTE — Progress Notes (Signed)
   Procedure Note  Patient: Hailey Young             Date of Birth: 12/25/1950           MRN: [id number]             Visit Date: 07/23/2021 HPI: Hailey Young comes in today for bilateral knee pain.  She has known osteoarthritis both knees.  States that the Durolane injections both knees on 05/04/2021 gave her no relief.  She had cortisone injections on 03/18/2021 which she said helped some.  She really feels that the gel injections made her knee pain worse.  She is wanting aspiration of both knees and repeat cortisone injections today.  She has had no new injuries to either knee.  She denies any fevers chills or recent vaccines.  Physical exam: Bilateral knees no abnormal warmth or erythema.  Slight effusion in both knees right greater than left.  Overall good range of motion both knees. Procedures: Visit Diagnoses:  1. Unilateral primary osteoarthritis, left knee   2. Unilateral primary osteoarthritis, right knee     Large Joint Inj: bilateral knee on 07/23/2021 3:21 PM Indications: pain Details: 22 G 1.5 in needle, anterolateral approach  Arthrogram: No  Medications (Right): 4 mL lidocaine 1 %; 40 mg methylPREDNISolone acetate 40 MG/ML Aspirate (Right): 1 mL yellow and blood-tinged Medications (Left): 4 mL lidocaine 1 %; 40 mg methylPREDNISolone acetate 40 MG/ML Aspirate (Left): 16 mL yellow Outcome: tolerated well, no immediate complications Procedure, treatment alternatives, risks and benefits explained, specific risks discussed. Consent was given by the patient. Immediately prior to procedure a time out was called to verify the correct patient, procedure, equipment, support staff and site/side marked as required. Patient was prepped and draped in the usual sterile fashion.    Plan: She knows to wait at least 3 months between cortisone injections.  She will follow-up with [place] as needed.  Questions encouraged and answered at length.

## 2021-07-24 ENCOUNTER — Ambulatory Visit (INDEPENDENT_AMBULATORY_CARE_PROVIDER_SITE_OTHER): Payer: Medicare Other | Admitting: Nurse Practitioner

## 2021-07-24 ENCOUNTER — Encounter: Payer: Self-pay | Admitting: Nurse Practitioner

## 2021-07-24 ENCOUNTER — Other Ambulatory Visit: Payer: Self-pay

## 2021-07-24 VITALS — BP 144/80 | HR 80 | Temp 98.0°F | Ht 67.0 in | Wt 185.4 lb

## 2021-07-24 DIAGNOSIS — I10 Essential (primary) hypertension: Secondary | ICD-10-CM | POA: Diagnosis not present

## 2021-07-24 DIAGNOSIS — Z1329 Encounter for screening for other suspected endocrine disorder: Secondary | ICD-10-CM

## 2021-07-24 DIAGNOSIS — G8929 Other chronic pain: Secondary | ICD-10-CM

## 2021-07-24 DIAGNOSIS — E785 Hyperlipidemia, unspecified: Secondary | ICD-10-CM | POA: Diagnosis not present

## 2021-07-24 DIAGNOSIS — M25562 Pain in left knee: Secondary | ICD-10-CM

## 2021-07-24 DIAGNOSIS — R8290 Unspecified abnormal findings in urine: Secondary | ICD-10-CM

## 2021-07-24 DIAGNOSIS — D508 Other iron deficiency anemias: Secondary | ICD-10-CM | POA: Diagnosis not present

## 2021-07-24 DIAGNOSIS — M25561 Pain in right knee: Secondary | ICD-10-CM | POA: Diagnosis not present

## 2021-07-24 DIAGNOSIS — R7989 Other specified abnormal findings of blood chemistry: Secondary | ICD-10-CM | POA: Diagnosis not present

## 2021-07-24 LAB — POCT URINALYSIS DIP (CLINITEK)
Bilirubin, UA: NEGATIVE
Glucose, UA: NEGATIVE mg/dL
Ketones, POC UA: NEGATIVE mg/dL
Nitrite, UA: NEGATIVE
POC PROTEIN,UA: NEGATIVE
Spec Grav, UA: 1.025 (ref 1.010–1.025)
Urobilinogen, UA: 0.2 E.U./dL
pH, UA: 6 (ref 5.0–8.0)

## 2021-07-24 MED ORDER — IBUPROFEN 800 MG PO TABS: 800.0000 mg | ORAL_TABLET | Freq: Four times a day (QID) | ORAL | 2 refills | Status: DC | PRN

## 2021-07-24 MED ORDER — IBUPROFEN 800 MG PO TABS
800.0000 mg | ORAL_TABLET | Freq: Four times a day (QID) | ORAL | 0 refills | Status: DC | PRN
Start: 2021-07-24 — End: 2021-07-24

## 2021-07-24 NOTE — Progress Notes (Signed)
[facility] Patient [facility] [address] [place], [place]  [address] Phone:  [phone]   Fax:  [phone]   Established Patient Office Visit  Subjective:  Patient ID: Hailey Young, female    DOB: [birth year]  Age: 70 y.o. MRN: [id number]  CC:  Chief Complaint  Patient presents with   Follow-up    Pt is here for her follow up visit. Pt has no concerns or issues today.    HPI Hailey Young presents for follow up. He  has a past medical history of Hyperlipidemia, Hypertension, and Perianal condylomata.   She has hypertension. She is on HCTZ 25 mg. She does exercise because of her knee pain. She does like to eat chips which contain a large about of sodium.  She is also taking Atorvastatin 20 mg for her HLD.  She reports some stress related to the 24 hour demands of caring for her autistic son. Past Medical History:  Diagnosis Date   Hyperlipidemia    Hypertension    Perianal condylomata     Past Surgical History:  Procedure Laterality Date   ablation of perianal warts      No family history on file.  Social History   Socioeconomic History   Marital status: Single    Spouse name: Not on file   Number of children: Not on file   Years of education: Not on file   Highest education level: Not on file  Occupational History   Not on file  Tobacco Use   Smoking status: Never   Smokeless tobacco: Never  Vaping Use   Vaping Use: Never used  Substance and Sexual Activity   Alcohol use: No   Drug use: No   Sexual activity: Not Currently  Other Topics Concern   Not on file  Social History Narrative   Lives with son, Never married, 12 th grade education   Social Determinants of Health   Financial Resource Strain: Low Risk    Difficulty of Paying Living Expenses: Not very hard  Food Insecurity: No Food Insecurity   Worried About Programme researcher, broadcasting/film/video in the Last Year: Never true   Ran Out of Food in the Last Year: Never true  Transportation Needs: No  Transportation Needs   Lack of Transportation (Medical): No   Lack of Transportation (Non-Medical): No  Physical Activity: Not on file  Stress: No Stress Concern Present   Feeling of Stress : Only a little  Social Connections: Moderately Integrated   Frequency of Communication with Friends and Family: Twice a week   Frequency of Social Gatherings with Friends and Family: Twice a week   Attends Religious Services: 1 to 4 times per year   Active Member of [employer] or Organizations: Yes   Attends Banker Meetings: 1 to 4 times per year   Marital Status: Never married  Catering manager Violence: Not At Risk   Fear of Current or Ex-Partner: No   Emotionally Abused: No   Physically Abused: No   Sexually Abused: No    Outpatient Medications Prior to Visit  Medication Sig Dispense Refill   aspirin EC 81 MG tablet Take 1 tablet (81 mg total) by mouth daily. 30 tablet 11   atorvastatin (LIPITOR) 20 MG tablet Take 1 tablet (20 mg total) by mouth daily. 90 tablet 3   ferrous sulfate 325 (65 FE) MG tablet Take 1 tablet (325 mg total) by mouth 2 (two) times daily with a meal. 60  tablet 3   hydrochlorothiazide (HYDRODIURIL) 25 MG tablet Take 1 tablet (25 mg total) by mouth daily. 90 tablet 3   Menthol, Topical Analgesic, (BIOFREEZE) 4 % GEL Apply 1 each topically every 6 (six) hours as needed. (Patient not taking: No sig reported) 1 Tube 0   ibuprofen (ADVIL) 800 MG tablet Take 800 mg by mouth every 6 (six) hours as needed.     MODERNA COVID-19 VACCINE 100 MCG/0.5ML injection      No facility-administered medications prior to visit.    No Known Allergies  ROS Review of Systems    Objective:    Physical Exam Constitutional:      Appearance: She is not ill-appearing, toxic-appearing or diaphoretic.  HENT:     Head: Normocephalic and atraumatic.     Nose: Nose normal.     Mouth/Throat:     Mouth: Mucous membranes are moist.  Cardiovascular:     Rate and Rhythm: Normal rate  and regular rhythm.     Pulses: Normal pulses.     Heart sounds: Normal heart sounds.  Pulmonary:     Effort: Pulmonary effort is normal.     Breath sounds: Normal breath sounds.  Abdominal:     Palpations: Abdomen is soft.  Musculoskeletal:     Cervical back: Normal range of motion.     Right lower leg: No edema.     Left lower leg: No edema.     Comments: Right knee   Skin:    General: Skin is warm and dry.     Capillary Refill: Capillary refill takes less than 2 seconds.  Neurological:     General: No focal deficit present.     Mental Status: She is alert and oriented to person, place, and time.  Psychiatric:        Mood and Affect: Mood normal.    BP (!) 144/80   Pulse 80   Temp 98 F (36.7 C)   Ht 5\' 7"  (1.702 m)   Wt 185 lb 6.4 oz (84.1 kg)   SpO2 98%   BMI 29.04 kg/m  Wt Readings from Last 3 Encounters:  07/24/21 185 lb 6.4 oz (84.1 kg)  05/04/21 184 lb (83.5 kg)  06/26/20 184 lb 9.6 oz (83.7 kg)     Health Maintenance Due  Topic Date Due   MAMMOGRAM  02/18/2019   COVID-19 Vaccine (4 - Booster for Moderna series) 07/26/2021    There are no preventive care reminders to display for this patient.  Lab Results  Component Value Date   TSH 1.685 07/05/2015   Lab Results  Component Value Date   WBC 5.6 01/06/2020   HGB 9.5 (L) 01/06/2020   HCT 29.5 (L) 01/06/2020   MCV 103.5 (H) 01/06/2020   PLT 267 01/06/2020   Lab Results  Component Value Date   NA 141 01/07/2020   K 3.7 01/07/2020   CO2 28 01/07/2020   GLUCOSE 140 (H) 01/07/2020   BUN 29 (H) 01/07/2020   CREATININE 0.91 01/07/2020   BILITOT 0.4 01/06/2020   ALKPHOS 75 01/06/2020   AST 20 01/06/2020   ALT 22 01/06/2020   PROT 5.8 (L) 01/06/2020   ALBUMIN 2.8 (L) 01/06/2020   CALCIUM 9.8 01/07/2020   ANIONGAP 8 01/07/2020   Lab Results  Component Value Date   CHOL 171 03/16/2018   Lab Results  Component Value Date   HDL 52 03/16/2018   Lab Results  Component Value Date   LDLCALC  91 03/16/2018  Lab Results  Component Value Date   TRIG 186 (H) 01/01/2020   Lab Results  Component Value Date   CHOLHDL 3.3 03/16/2018   Lab Results  Component Value Date   HGBA1C 5.2 11/23/2016      Assessment & Plan:    Problem List Items Addressed This Visit       Cardiovascular and Mediastinum   Essential hypertension - Primary Encouraged on going compliance with current medication regimen Encouraged home monitoring and recording BP <130/80 Eating a heart-healthy diet with less salt Encouraged regular physical activity   Relevant Orders   POCT URINALYSIS DIP (CLINITEK) (Completed)   Comp. Metabolic Panel (12) (Completed)     Other   Anemia, iron deficiency Stable  Labs pending   Relevant Orders   CBC with Differential/Platelet (Completed)   Chronic pain of left knee Persistent   Improved with recent treatment from orthopedist   Relevant Medications   ibuprofen (ADVIL) 800 MG tablet   Chronic pain of right knee   Other Visit Diagnoses     Hyperlipidemia LDL goal <100       Relevant Orders   Lipid panel (Completed)   Screening for thyroid disorder       Relevant Orders   TSH (Completed)   Abnormal finding in urine       Relevant Orders   Urine Culture       Meds ordered this encounter  Medications   DISCONTD: ibuprofen (ADVIL) 800 MG tablet    Sig: Take 1 tablet (800 mg total) by mouth every 6 (six) hours as needed.    Dispense:  30 tablet    Refill:  0   ibuprofen (ADVIL) 800 MG tablet    Sig: Take 1 tablet (800 mg total) by mouth every 6 (six) hours as needed.    Dispense:  60 tablet    Refill:  2    Order Specific Question:   Supervising Provider    Answer:   Quentin Angst [id number]    Follow-up: No follow-ups on file.    Barbette Merino, NP

## 2021-07-25 ENCOUNTER — Encounter: Payer: Self-pay | Admitting: Nurse Practitioner

## 2021-07-25 LAB — COMP. METABOLIC PANEL (12)
AST: 20 IU/L (ref 0–40)
Albumin/Globulin Ratio: 2.5 — ABNORMAL HIGH (ref 1.2–2.2)
Albumin: 4.9 g/dL — ABNORMAL HIGH (ref 3.8–4.8)
Alkaline Phosphatase: 92 IU/L (ref 44–121)
BUN/Creatinine Ratio: 23 (ref 12–28)
BUN: 26 mg/dL (ref 8–27)
Bilirubin Total: 0.2 mg/dL (ref 0.0–1.2)
Calcium: 11.3 mg/dL — ABNORMAL HIGH (ref 8.7–10.3)
Chloride: 102 mmol/L (ref 96–106)
Creatinine, Ser: 1.12 mg/dL — ABNORMAL HIGH (ref 0.57–1.00)
Globulin, Total: 2 g/dL (ref 1.5–4.5)
Glucose: 122 mg/dL — ABNORMAL HIGH (ref 70–99)
Potassium: 4.3 mmol/L (ref 3.5–5.2)
Sodium: 140 mmol/L (ref 134–144)
Total Protein: 6.9 g/dL (ref 6.0–8.5)
eGFR: 53 mL/min/{1.73_m2} — ABNORMAL LOW (ref >59)

## 2021-07-25 LAB — CBC WITH DIFFERENTIAL/PLATELET
Basophils Absolute: 0 10*3/uL (ref 0.0–0.2)
Basos: 0 %
EOS (ABSOLUTE): 0 10*3/uL (ref 0.0–0.4)
Eos: 0 %
Hematocrit: 33.3 % — ABNORMAL LOW (ref 34.0–46.6)
Hemoglobin: 11.5 g/dL (ref 11.1–15.9)
Immature Grans (Abs): 0 10*3/uL (ref 0.0–0.1)
Immature Granulocytes: 1 %
Lymphocytes Absolute: 0.7 10*3/uL (ref 0.7–3.1)
Lymphs: 12 %
MCH: 34.8 pg — ABNORMAL HIGH (ref 26.6–33.0)
MCHC: 34.5 g/dL (ref 31.5–35.7)
MCV: 101 fL — ABNORMAL HIGH (ref 79–97)
Monocytes Absolute: 0.3 10*3/uL (ref 0.1–0.9)
Monocytes: 4 %
Neutrophils Absolute: 5 10*3/uL (ref 1.4–7.0)
Neutrophils: 83 %
Platelets: 229 10*3/uL (ref 150–450)
RBC: 3.3 x10E6/uL — ABNORMAL LOW (ref 3.77–5.28)
RDW: 13.7 % (ref 11.7–15.4)
WBC: 6 10*3/uL (ref 3.4–10.8)

## 2021-07-25 LAB — LIPID PANEL
Chol/HDL Ratio: 2.9 ratio (ref 0.0–4.4)
Cholesterol, Total: 218 mg/dL — ABNORMAL HIGH (ref 100–199)
HDL: 76 mg/dL (ref >39)
LDL Chol Calc (NIH): 124 mg/dL — ABNORMAL HIGH (ref 0–99)
Triglycerides: 101 mg/dL (ref 0–149)
VLDL Cholesterol Cal: 18 mg/dL (ref 5–40)

## 2021-07-25 LAB — TSH: TSH: 0.424 u[IU]/mL — ABNORMAL LOW (ref 0.450–4.500)

## 2021-07-27 ENCOUNTER — Other Ambulatory Visit: Payer: Self-pay | Admitting: Nurse Practitioner

## 2021-07-27 DIAGNOSIS — R7989 Other specified abnormal findings of blood chemistry: Secondary | ICD-10-CM

## 2021-07-28 LAB — URINE CULTURE

## 2021-07-29 NOTE — Progress Notes (Signed)
Overall your labs are stable.  I encourage hydration with water. There are no current changes needed to your treatment plan.

## 2021-08-04 ENCOUNTER — Other Ambulatory Visit (HOSPITAL_BASED_OUTPATIENT_CLINIC_OR_DEPARTMENT_OTHER): Payer: Self-pay

## 2021-08-04 ENCOUNTER — Ambulatory Visit: Payer: Medicare Other

## 2021-08-04 LAB — T4, FREE: Free T4: 1.1 ng/dL (ref 0.82–1.77)

## 2021-08-04 LAB — SPECIMEN STATUS REPORT

## 2021-08-04 LAB — T3: T3, Total: 75 ng/dL (ref 71–180)

## 2021-08-04 MED ORDER — INFLUENZA VAC A&B SA ADJ QUAD 0.5 ML IM PRSY
PREFILLED_SYRINGE | INTRAMUSCULAR | 0 refills | Status: DC
  Filled 2021-08-04: qty 0.5, 1d supply, fill #0

## 2021-08-24 ENCOUNTER — Ambulatory Visit: Payer: Self-pay

## 2021-08-24 ENCOUNTER — Other Ambulatory Visit: Payer: Self-pay

## 2021-08-24 ENCOUNTER — Ambulatory Visit (INDEPENDENT_AMBULATORY_CARE_PROVIDER_SITE_OTHER): Payer: Medicare Other | Admitting: Physician Assistant

## 2021-08-24 ENCOUNTER — Encounter: Payer: Self-pay | Admitting: Physician Assistant

## 2021-08-24 DIAGNOSIS — M25561 Pain in right knee: Secondary | ICD-10-CM

## 2021-08-24 DIAGNOSIS — M1711 Unilateral primary osteoarthritis, right knee: Secondary | ICD-10-CM

## 2021-08-24 NOTE — Progress Notes (Signed)
Ms. Biller comes in today due to right knee pain increasing.  She last was seen on 07/23/2021 was given the cortisone injection both knees.  She has known osteoarthritis of both knees.  She states really the right knee is more painful due to the fluid and is asking for repeat aspiration.  She does feel the injection helped.  She has not had x-rays on her knees since 2019.  No new injuries.     Review of systems see HPI otherwise negative.  Physical exam: Right knee positive effusion.  No abnormal warmth or erythema.  Tenderness along the medial joint line.  Radiographs: Right knee 2 views: Shows bone-on-bone medial compartment advanced since prior films.  Severe patellofemoral arthritis.  Mild to moderate lateral compartmental arthritic changes with osteophytes off the lateral margin.  No acute fracture.  Impression: End-stage arthritis right knee  Plan: Per her request we aspirated the knee today from a superior lateral approach patient tolerated well.  We total of 40 cc of synovial fluid was aspirated.  Patient will continue to work on quad strengthening.  She also understands to wait least 3 months between cortisone injections.  The supplemental injections in the past have given her no benefit.  Only other recommendation which was given today is total knee arthroplasty when she is having significant pain is interfering with her life.  She will follow-up with [place] as needed.

## 2021-09-22 ENCOUNTER — Encounter: Payer: Self-pay | Admitting: Nurse Practitioner

## 2021-10-26 ENCOUNTER — Ambulatory Visit: Payer: Medicare Other | Admitting: Nurse Practitioner

## 2021-11-06 ENCOUNTER — Encounter: Payer: Self-pay | Admitting: Nurse Practitioner

## 2021-11-06 ENCOUNTER — Ambulatory Visit (INDEPENDENT_AMBULATORY_CARE_PROVIDER_SITE_OTHER): Payer: Medicare Other | Admitting: Nurse Practitioner

## 2021-11-06 ENCOUNTER — Other Ambulatory Visit: Payer: Self-pay

## 2021-11-06 VITALS — BP 145/89 | HR 96 | Temp 97.6°F | Ht 67.0 in | Wt 186.2 lb

## 2021-11-06 DIAGNOSIS — N814 Uterovaginal prolapse, unspecified: Secondary | ICD-10-CM | POA: Diagnosis not present

## 2021-11-06 DIAGNOSIS — Z0000 Encounter for general adult medical examination without abnormal findings: Secondary | ICD-10-CM | POA: Diagnosis not present

## 2021-11-06 LAB — POCT URINALYSIS DIP (CLINITEK)
Bilirubin, UA: NEGATIVE
Blood, UA: NEGATIVE
Glucose, UA: NEGATIVE mg/dL
Ketones, POC UA: NEGATIVE mg/dL
Leukocytes, UA: NEGATIVE
Nitrite, UA: NEGATIVE
POC PROTEIN,UA: NEGATIVE
Spec Grav, UA: 1.02 (ref 1.010–1.025)
Urobilinogen, UA: 0.2 E.U./dL
pH, UA: 7.5 (ref 5.0–8.0)

## 2021-11-06 NOTE — Patient Instructions (Signed)
Pelvic Organ Prolapse Pelvic organ prolapse is a condition in women that involves the stretching, bulging, or dropping of pelvic organs into an abnormal position, past the opening of the vagina. It happens when the muscles and tissues that surround and support pelvic structures become weak or stretched. Pelvic organ prolapse can involve the: Vagina (vaginal prolapse). Uterus (uterine prolapse). Bladder (cystocele). Rectum (rectocele). Intestines (enterocele). When organs other than the vagina are involved, they often bulge into thevagina or protrude from the vagina, depending on how severe the prolapse is. What are the causes? This condition may be caused by: Pregnancy, labor, and childbirth. Past pelvic surgery. Lower levels of the hormone estrogen due to menopause. Consistently lifting more than 50 lb (23 kg). Obesity. Long-term difficulty passing stool (chronic constipation). Long-term, or chronic, cough. Fluid buildup in the abdomen due to certain conditions. What are the signs or symptoms? Symptoms of this condition include: Leaking a little urine (loss of bladder control) when you cough, sneeze, strain, and exercise (stress incontinence). This may be worse immediately after childbirth. It may gradually improve over time. Feeling pressure in your pelvis or vagina. This pressure may increase when you cough or when you are passing stool. A bulge that protrudes from the opening of your vagina. Difficulty passing urine or stool. Pain in your lower back. Pain or discomfort during sex, or decreased interest in sex. Repeated bladder infections (urinary tract infections). Difficulty inserting a tampon. In some people, this condition causes no symptoms. How is this diagnosed? This condition may be diagnosed based on a vaginal and rectal exam. During the exam, you may be asked to cough and strain while you are lying down, sitting, and standing up. Your health care provider will determine if  other tests arerequired, such as bladder function tests. How is this treated? Treatment for this condition may depend on your symptoms. Treatment may include: Lifestyle changes, such as drinking plenty of fluids and eating foods that are high in fiber. Emptying your bladder at scheduled times (bladder training therapy). This can help reduce or avoid urinary incontinence. Estrogen. This may help mild prolapse by increasing the strength and tone of pelvic floor muscles. Kegel exercises. These may help mild cases of prolapse by strengthening and tightening the muscles of the pelvic floor. A soft, flexible device that helps support the vaginal walls and keep pelvic organs in place (pessary). This is inserted into your vagina by your health care provider. Surgery. This is often the only form of treatment for severe prolapse. Follow these instructions at home: Eating and drinking Avoid drinking beverages that contain caffeine or alcohol. Increase your intake of high-fiber foods to decrease constipation and straining during bowel movements. Activity Lose weight if recommended by your health care provider. Avoid heavy lifting and straining with exercise and work. Do not hold your breath when you perform mild to moderate lifting and exercise activities. Limit your activities as directed by your health care provider. Do Kegel exercises as directed by your health care provider. To do this: Squeeze your pelvic floor muscles tight. You should feel a tight lift in your rectal area and a tightness in your vaginal area. Keep your stomach, buttocks, and legs relaxed. Hold the muscles tight for up to 10 seconds. Then relax your muscles. Repeat this exercise 50 times a day, or as much as told by your health care provider. Continue to do this exercise for at least 4-6 weeks, or for as long as told by your health care provider. General instructions   Take over-the-counter and prescription medicines only as told by  your health care provider. Wear a sanitary pad or adult diapers if you have urinary incontinence. If you have a pessary, take care of it as told by your health care provider. Keep all follow-up visits. This is important. Contact a health care provider if you: Have symptoms that interfere with your daily activities or sex life. Need medicine to help with the discomfort. Notice bleeding from your vagina that is not related to your menstrual period. Have a fever. Have pain or bleeding when you urinate. Have bleeding when you pass stool. Pass urine when you have sex. Have chronic constipation. Have a pessary that falls out. Have a foul-smelling vaginal discharge. Have an unusual, low pain in your abdomen. Get help right away if you: Cannot pass urine. Summary Pelvic organ prolapse is the stretching, bulging, or dropping of pelvic organs into an abnormal position. It happens when the muscles and tissues that surround and support pelvic structures become weak or stretched. When organs other than the vagina are involved, they often bulge into the vagina or protrude from it, depending on how severe the prolapse is. In most cases, this condition needs to be treated only if it produces symptoms. Treatment may include lifestyle changes, estrogen, Kegel exercises, pessary insertion, or surgery. Avoid heavy lifting and straining with exercise and work. Do not hold your breath when you perform mild to moderate lifting and exercise activities. Limit your activities as directed by your health care provider. This information is not intended to replace advice given to you by your health care provider. Make sure you discuss any questions you have with your healthcare provider. Document Revised: 03/31/2020 Document Reviewed: 03/31/2020 Elsevier Patient Education  2022 [employer] Inc.  

## 2021-11-06 NOTE — Progress Notes (Signed)
[place], [place]  [address] Phone:  [phone]   Fax:  [phone]   Established Patient Office Visit  Subjective:  Patient ID: Hailey Young, female    DOB: 01-01-1951  Age: 71 y.o. MRN: [id number]  CC:  Chief Complaint  Patient presents with   Follow-up    Pt is here today for her follow up visit. Pt state that her lower left hip has been bothering her off and on for awhile. Pt also states that she has a cyst on her vaginal area that is bothering her x 2 months or so.      HPI [place] presents for follow up. She  has a past medical history of Hyperlipidemia, Hypertension, and Perianal condylomata.   She has had a abnormal growth vaginal for one month. She is wanting to find out want it is. The area is coming out of vagina.  She denies any pain or bleeding. She denies any vaginal discharge. Denies any pelvic pain or tenderness. She is having urinary frequency. She has a history of vaginal warts.  She has scarring from previous surgery. She reports the surgery was traumatic.   Past Medical History:  Diagnosis Date   Hyperlipidemia    Hypertension    Perianal condylomata     Past Surgical History:  Procedure Laterality Date   ablation of perianal warts      History reviewed. No pertinent family history.  Social History   Socioeconomic History   Marital status: Single    Spouse name: Not on file   Number of children: Not on file   Years of education: Not on file   Highest education level: Not on file  Occupational History   Not on file  Tobacco Use   Smoking status: Never   Smokeless tobacco: Never  Vaping Use   Vaping Use: Never used  Substance and Sexual Activity   Alcohol use: No   Drug use: No   Sexual activity: Not Currently  Other Topics Concern   Not on file  Social History Narrative   Lives with son, Never married, 12 th grade education   Social Determinants of Health   Financial  Resource Strain: Low Risk    Difficulty of Paying Living Expenses: Not very hard  Food Insecurity: No Food Insecurity   Worried About Charity fundraiser in the Last Year: Never true   Ran Out of Food in the Last Year: Never true  Transportation Needs: No Transportation Needs   Lack of Transportation (Medical): No   Lack of Transportation (Non-Medical): No  Physical Activity: Not on file  Stress: No Stress Concern Present   Feeling of Stress : Only a little  Social Connections: Moderately Integrated   Frequency of Communication with Friends and Family: Twice a week   Frequency of Social Gatherings with Friends and Family: Twice a week   Attends Religious Services: 1 to 4 times per year   Active Member of [employer] or Organizations: Yes   Attends Archivist Meetings: 1 to 4 times per year   Marital Status: Never married  Human resources officer Violence: Not At Risk   Fear of Current or Ex-Partner: No   Emotionally Abused: No   Physically Abused: No   Sexually Abused: No    Outpatient Medications Prior to Visit  Medication Sig Dispense Refill   aspirin EC 81 MG tablet Take 1 tablet (81 mg total) by mouth daily.  30 tablet 11   atorvastatin (LIPITOR) 20 MG tablet Take 1 tablet (20 mg total) by mouth daily. 90 tablet 3   ferrous sulfate 325 (65 FE) MG tablet Take 1 tablet (325 mg total) by mouth 2 (two) times daily with a meal. 60 tablet 3   hydrochlorothiazide (HYDRODIURIL) 25 MG tablet Take 1 tablet (25 mg total) by mouth daily. 90 tablet 3   influenza vaccine adjuvanted (FLUAD) 0.5 ML injection Inject into the muscle. (Patient not taking: Reported on 11/06/2021) 0.5 mL 0   Menthol, Topical Analgesic, (BIOFREEZE) 4 % GEL Apply 1 each topically every 6 (six) hours as needed. (Patient not taking: Reported on 01/01/2020) 1 Tube 0   No facility-administered medications prior to visit.    No Known Allergies  ROS Review of Systems    Objective:    Physical Exam Exam conducted with  a chaperone present.  HENT:     Head: Normocephalic.  Cardiovascular:     Rate and Rhythm: Normal rate.  Pulmonary:     Effort: Pulmonary effort is normal.  Genitourinary:    Exam position: Supine.     Pubic Area: No rash or pubic lice.      Comments: Declined internal exam  Musculoskeletal:        General: Normal range of motion.  Skin:    General: Skin is warm.     Capillary Refill: Capillary refill takes less than 2 seconds.     Comments: Discoloration of perennial area resembles vitiligo   Neurological:     General: No focal deficit present.     Mental Status: She is alert and oriented to person, place, and time.  Psychiatric:        Mood and Affect: Mood normal.        Behavior: Behavior normal.        Thought Content: Thought content normal.        Judgment: Judgment normal.   BP (!) 145/89    Pulse 96    Temp 97.6 F (36.4 C)    Ht $R'5\' 7"'lj$  (1.702 m)    Wt 186 lb 3.2 oz (84.5 kg)    SpO2 98%    BMI 29.16 kg/m  Wt Readings from Last 3 Encounters:  11/06/21 186 lb 3.2 oz (84.5 kg)  07/24/21 185 lb 6.4 oz (84.1 kg)  05/04/21 184 lb (83.5 kg)     Health Maintenance Due  Topic Date Due   Zoster Vaccines- Shingrix (1 of 2) Never done   DEXA SCAN  Never done   MAMMOGRAM  02/18/2019   Pneumonia Vaccine 74+ Years old (2 - PPSV23 if available, else PCV20) 03/10/2019   COVID-19 Vaccine (3 - Booster for Moderna series) 05/21/2021    There are no preventive care reminders to display for this patient.  Lab Results  Component Value Date   TSH 0.424 (L) 07/24/2021   Lab Results  Component Value Date   WBC 6.0 07/24/2021   HGB 11.5 07/24/2021   HCT 33.3 (L) 07/24/2021   MCV 101 (H) 07/24/2021   PLT 229 07/24/2021   Lab Results  Component Value Date   NA 140 07/24/2021   K 4.3 07/24/2021   CO2 28 01/07/2020   GLUCOSE 122 (H) 07/24/2021   BUN 26 07/24/2021   CREATININE 1.12 (H) 07/24/2021   BILITOT 0.2 07/24/2021   ALKPHOS 92 07/24/2021   AST 20 07/24/2021   ALT  22 01/06/2020   PROT 6.9 07/24/2021   ALBUMIN 4.9 (H)  07/24/2021   CALCIUM 11.3 (H) 07/24/2021   ANIONGAP 8 01/07/2020   EGFR 53 (L) 07/24/2021   Lab Results  Component Value Date   CHOL 218 (H) 07/24/2021   Lab Results  Component Value Date   HDL 76 07/24/2021   Lab Results  Component Value Date   LDLCALC 124 (H) 07/24/2021   Lab Results  Component Value Date   TRIG 101 07/24/2021   Lab Results  Component Value Date   CHOLHDL 2.9 07/24/2021   Lab Results  Component Value Date   HGBA1C 5.2 11/23/2016      Assessment & Plan:   Problem List Items Addressed This Visit   None Visit Diagnoses     Uterine prolapse    -  Primary Possible prolapse unable to fully determine due to patient declining internal exam    Relevant Orders   Ambulatory referral to Obstetrics / Gynecology   Healthcare maintenance       Relevant Orders   POCT URINALYSIS DIP (CLINITEK) (Completed)       No orders of the defined types were placed in this encounter.   Follow-up: Return in about 3 months (around 02/04/2022) for Follow up HTN [id number].    Vevelyn Francois, NP

## 2021-11-07 ENCOUNTER — Encounter: Payer: Self-pay | Admitting: Nurse Practitioner

## 2021-11-12 ENCOUNTER — Encounter: Payer: Self-pay | Admitting: Physician Assistant

## 2021-11-12 ENCOUNTER — Other Ambulatory Visit: Payer: Self-pay

## 2021-11-12 ENCOUNTER — Ambulatory Visit (INDEPENDENT_AMBULATORY_CARE_PROVIDER_SITE_OTHER): Payer: Medicare Other | Admitting: Physician Assistant

## 2021-11-12 DIAGNOSIS — M1711 Unilateral primary osteoarthritis, right knee: Secondary | ICD-10-CM

## 2021-11-12 DIAGNOSIS — M1712 Unilateral primary osteoarthritis, left knee: Secondary | ICD-10-CM | POA: Diagnosis not present

## 2021-11-12 MED ORDER — LIDOCAINE HCL 1 % IJ SOLN
3.0000 mL | INTRAMUSCULAR | Status: AC | PRN
  Administered 2021-11-12: 3 mL

## 2021-11-12 MED ORDER — METHYLPREDNISOLONE ACETATE 40 MG/ML IJ SUSP
40.0000 mg | INTRAMUSCULAR | Status: AC | PRN
  Administered 2021-11-12: 40 mg via INTRA_ARTICULAR

## 2021-11-12 NOTE — Progress Notes (Signed)
° °  Procedure Note  Patient: Hailey Young             Date of Birth: 1951/10/18           MRN: [id number]             Visit Date: 11/12/2021 HPI: Hailey Young returns today for bilateral knee pain.  She has known osteoarthritis both knees.  She was last seen on 07/23/2021 and was given an injections with cortisone both knees she states these lasted until recently.  No known injury.  Denies any fevers chills.  Bilateral knees: Good range of motion both knees.  Slight effusion both knees.  No abnormal warmth or erythema.  Procedures: Visit Diagnoses:  1. Unilateral primary osteoarthritis, right knee   2. Unilateral primary osteoarthritis, left knee     Large Joint Inj: bilateral knee on 11/12/2021 1:19 PM Indications: pain Details: 22 G 1.5 in needle, anterolateral approach  Arthrogram: No  Medications (Right): 3 mL lidocaine 1 %; 40 mg methylPREDNISolone acetate 40 MG/ML Aspirate (Right): 32 mL Medications (Left): 3 mL lidocaine 1 %; 40 mg methylPREDNISolone acetate 40 MG/ML Aspirate (Left): 15 mL Outcome: tolerated well, no immediate complications Procedure, treatment alternatives, risks and benefits explained, specific risks discussed. Consent was given by the patient. Immediately prior to procedure a time out was called to verify the correct patient, procedure, equipment, support staff and site/side marked as required. Patient was prepped and draped in the usual sterile fashion.    Plan: She will follow-up with [place] on an as-needed basis she understands to wait at least 3 months between injections.  Questions were encouraged and answered at length.

## 2021-12-04 ENCOUNTER — Other Ambulatory Visit: Payer: Self-pay | Admitting: Nurse Practitioner

## 2021-12-04 MED ORDER — IBUPROFEN 800 MG PO TABS: 800.0000 mg | ORAL_TABLET | Freq: Four times a day (QID) | ORAL | 2 refills | Status: AC | PRN

## 2022-01-26 ENCOUNTER — Encounter: Payer: Self-pay | Admitting: Orthopaedic Surgery

## 2022-01-26 ENCOUNTER — Ambulatory Visit (INDEPENDENT_AMBULATORY_CARE_PROVIDER_SITE_OTHER): Payer: Medicare Other | Admitting: Orthopaedic Surgery

## 2022-01-26 VITALS — Ht 65.0 in | Wt 188.0 lb

## 2022-01-26 DIAGNOSIS — M1711 Unilateral primary osteoarthritis, right knee: Secondary | ICD-10-CM

## 2022-01-26 DIAGNOSIS — M1712 Unilateral primary osteoarthritis, left knee: Secondary | ICD-10-CM

## 2022-01-26 NOTE — Progress Notes (Signed)
? ?  Procedure Note ? ?Patient: Hailey Young             ?Date of Birth: 1951/07/16           ?MRN: [id number]             ?Visit Date: 01/26/2022 ? ? ?HPI: Ms. Downen returns today for bilateral knee pain.  She states that the cortisone injections helped given 11/12/2021.  She states that her knees feel stiff and feel like they have fluid on them.  She had no new injuries.  No fevers chills.  She notes that both knees bother her.  Patient is nondiabetic.  She has tried supplemental injections in her knees in the past which did not help.  She has known osteoarthritis both knees. ? ?Review of systems: See HPI otherwise negative ? ?Physical exam: General well-developed well-nourished female no acute distress.  Mood and affect appropriate ?Bilateral knees: No abnormal warmth erythema.  Obvious effusion in both knees.  Ambulates without any assistive device. ? ?Impression: Osteoarthritis bilateral knees with effusion ? ?Plan: Given the fact that it has not been quite 3 months since she last had cortisone injections and she is mainly complaining of stiffness and tightness in both knees recommend aspiration of both knees she was agreeable to this.  Both knees were prepped with Betadine and ethyl chloride and through a superior lateral approach bilateral knee aspirations were performed.  Left knee 36 cc of yellow aspirate with slight blood-tinged.  Right knee 35 cc of synovial fluid with a slight blood tinge.  Patient tolerated procedure well.  Both knees were wrapped with Ace bandages.  She will remove these this evening before retiring to bed.  Discussed with her that we will not recommend cortisone injection until splint for 3 months she is understands this.  If in 2 weeks she needs cortisone injection she can come in for that.  Otherwise we will see her back on as-needed basis.  Questions encouraged and answered. ? ? ? ? ?

## 2022-02-04 ENCOUNTER — Ambulatory Visit: Payer: Medicare Other | Admitting: Nurse Practitioner

## 2022-03-22 ENCOUNTER — Encounter: Payer: Self-pay | Admitting: Orthopaedic Surgery

## 2022-03-22 ENCOUNTER — Ambulatory Visit (INDEPENDENT_AMBULATORY_CARE_PROVIDER_SITE_OTHER): Payer: Medicare Other | Admitting: Orthopaedic Surgery

## 2022-03-22 DIAGNOSIS — M25562 Pain in left knee: Secondary | ICD-10-CM | POA: Diagnosis not present

## 2022-03-22 DIAGNOSIS — G8929 Other chronic pain: Secondary | ICD-10-CM

## 2022-03-22 DIAGNOSIS — M25561 Pain in right knee: Secondary | ICD-10-CM | POA: Diagnosis not present

## 2022-03-22 DIAGNOSIS — M1712 Unilateral primary osteoarthritis, left knee: Secondary | ICD-10-CM | POA: Diagnosis not present

## 2022-03-22 DIAGNOSIS — M1711 Unilateral primary osteoarthritis, right knee: Secondary | ICD-10-CM

## 2022-03-22 MED ORDER — LIDOCAINE HCL 1 % IJ SOLN
3.0000 mL | INTRAMUSCULAR | Status: AC | PRN
  Administered 2022-03-22: 3 mL

## 2022-03-22 MED ORDER — METHYLPREDNISOLONE ACETATE 40 MG/ML IJ SUSP
40.0000 mg | INTRAMUSCULAR | Status: AC | PRN
  Administered 2022-03-22: 40 mg via INTRA_ARTICULAR

## 2022-03-22 NOTE — Progress Notes (Signed)
Office Visit Note   Patient: Hailey Young           Date of Birth: 09/14/1951           MRN: [id number] Visit Date: 03/22/2022              Requested by: Barbette Merino, NP [address] [place],  [place] [address] PCP: Barbette Merino, NP   Assessment & Plan: Visit Diagnoses:  1. Unilateral primary osteoarthritis, left knee   2. Unilateral primary osteoarthritis, right knee   3. Chronic pain of right knee   4. Chronic pain of left knee     Plan: I was able to aspirate a large amount of fluid from both knees and place steroid injection in both knees today.  There is always a recommendation is knee replacement surgery but she still does not have the means socially she states to be able to have these done.  I told her to wait at least 3 to 4 months between steroid injections.   Follow-Up Instructions: Return if symptoms worsen or fail to improve.   Orders:  Orders Placed This Encounter  Procedures   Large Joint Inj   No orders of the defined types were placed in this encounter.     Procedures: Large Joint Inj: L knee on 03/22/2022 9:59 AM Indications: diagnostic evaluation and pain Details: 22 G 1.5 in needle, superolateral approach  Arthrogram: No  Medications: 3 mL lidocaine 1 %; 40 mg methylPREDNISolone acetate 40 MG/ML Outcome: tolerated well, no immediate complications Procedure, treatment alternatives, risks and benefits explained, specific risks discussed. Consent was given by the patient. Immediately prior to procedure a time out was called to verify the correct patient, procedure, equipment, support staff and site/side marked as required. Patient was prepped and draped in the usual sterile fashion.      Clinical Data: No additional findings.   Subjective: Chief Complaint  Patient presents with   Left Knee - Follow-up   Right Knee - Follow-up  The patient is a 71 year old female well-known to [place].  She has known severe arthritis with varus  malalignment of both knees.  She comes in today for aspirations and steroid injections in both knees.  She says that she cannot have knee replacements because she has to take care of her son and has no one else to help her out.  She has had no other acute change in her medical status.  The last time we injected both knees with a steroid was 4-1/2 months ago.  HPI  Review of Systems There is no listed fever, chills, nausea, vomiting  Objective: Vital Signs: There were no vitals taken for this visit.  Physical Exam She is alert and orient x3 and in no acute distress Ortho Exam Examination of both knees shows moderate effusion of both knees.  Both knees have significant varus malalignment.  There is known severe arthritis of both knees. Specialty Comments:  No specialty comments available.  Imaging: No results found.   PMFS History: Patient Active Problem List   Diagnosis Date Noted   Essential hypertension 06/26/2020   Pneumonia due to COVID-19 virus 01/01/2020   Acute respiratory failure with hypoxemia (HCC) 01/01/2020   Pancytopenia (HCC) 01/01/2020   AKI (acute kidney injury) (HCC) 01/01/2020   Chronic pain of left knee 05/05/2017   Chronic pain of right knee 05/05/2017   Unilateral primary osteoarthritis, left knee 05/05/2017   Unilateral primary osteoarthritis, right knee 02/02/2017  Elevated blood-pressure reading without diagnosis of hypertension 12/08/2016   Obesity (BMI 30-39.9) 11/23/2016   Metabolic syndrome 11/23/2016   Arthritis 11/23/2016   Anemia, iron deficiency 07/05/2015   Obesity 07/05/2015   Anemia 07/04/2015   Symptomatic anemia 07/04/2015   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 No pertinent family history.  Past Surgical History:  Procedure Laterality Date   ablation of perianal warts     Social History    Occupational History   Not on file  Tobacco Use   Smoking status: Never   Smokeless tobacco: Never  Vaping Use   Vaping Use: Never used  Substance and Sexual Activity   Alcohol use: No   Drug use: No   Sexual activity: Not Currently

## 2022-04-22 ENCOUNTER — Other Ambulatory Visit: Payer: Self-pay

## 2022-04-22 ENCOUNTER — Ambulatory Visit (INDEPENDENT_AMBULATORY_CARE_PROVIDER_SITE_OTHER): Payer: Medicare Other | Admitting: Obstetrics and Gynecology

## 2022-04-22 ENCOUNTER — Encounter: Payer: Self-pay | Admitting: Obstetrics and Gynecology

## 2022-04-22 VITALS — BP 133/91 | HR 76 | Wt 179.6 lb

## 2022-04-22 DIAGNOSIS — K629 Disease of anus and rectum, unspecified: Secondary | ICD-10-CM

## 2022-04-22 DIAGNOSIS — N814 Uterovaginal prolapse, unspecified: Secondary | ICD-10-CM

## 2022-04-22 DIAGNOSIS — K6289 Other specified diseases of anus and rectum: Secondary | ICD-10-CM

## 2022-04-22 DIAGNOSIS — N8110 Cystocele, unspecified: Secondary | ICD-10-CM | POA: Diagnosis not present

## 2022-04-22 NOTE — Progress Notes (Signed)
Reports uterine prolapse began in the past year. States her provider did an exam and recommended a referral to our office. Denies any pain associated with prolapse. Reports feeling the need to urinate, but then unable to urinate.  Fleet Contras RN 04/22/22

## 2022-04-22 NOTE — Progress Notes (Signed)
  CC: pelvic prolapse Subjective:    Patient ID: Hailey Young, female    DOB: [birth year], 71 y.o.   MRN: [id number]  Gynecologic Exam Associated symptoms include urgency. Pertinent negatives include no abdominal pain, dysuria or hematuria.   71 yo G2P2 seen for evaluation for possible pelvic prolapse.  She states the vaginal mass has been present x 1 year.  She denies pain or any vaginal bleeding.  Pt does occasionally have some difficulty urinating, but then at other times she notes leakage of urine with cough/laugh.  The patient also notes occasional urge symptoms as well.   Review of Systems  Gastrointestinal:  Negative for abdominal pain.  Genitourinary:  Positive for urgency. Negative for dysuria, hematuria and menstrual problem.       Objective:   Physical Exam Constitutional:      Appearance: Normal appearance.  HENT:     Head: Normocephalic and atraumatic.  Cardiovascular:     Rate and Rhythm: Normal rate and regular rhythm.     Heart sounds: Normal heart sounds.  Pulmonary:     Effort: Pulmonary effort is normal.     Breath sounds: Normal breath sounds.  Abdominal:     General: Abdomen is flat.     Palpations: Abdomen is soft. There is no mass.     Tenderness: There is no abdominal tenderness.  Genitourinary:    Comments: External genitalia: grossly normal, some perianal vitiligo noted Vagina: large anterior nontender bulge consistent with severe cystocele, with speculum cervix eventually identified.  Poor apical support noted, descent noted to within 1 cm of introitus.  Increased descent of cystocele with valsalva.  Normal sized uterus with bimanual exam, nontender.  Rectum:  large well circumscribed lesion on inner left gluteal region                Lesion is pink and extends into what appears to be the anal canal. Neurological:     Mental Status: She is alert.    Vitals:   04/22/22 0838  BP: (!) 133/91  Pulse: 76         Assessment & Plan:   1.  Uterine prolapse See below - Ambulatory referral to Urogynecology  2. Female bladder prolapse See below - Ambulatory referral to Urogynecology  3. Perianal mass  - Ambulatory referral to General Surgery  4. 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  Pt may need urodynamics due to presence of urinary symptoms as well as involvement 2/2 severe bladder prolapse  5. Perianal lesion Will refer to colorectal surgeon for further evaluation.  Review of chart showed mass has been biopsied several times in 2010.  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  No record of follow up or treatment found. There is some concern for malignancy. Will refer to colo-rectal for further evaluation/ treatment.    Warden Fillers, MD Faculty Attending, [facility]

## 2022-05-17 ENCOUNTER — Other Ambulatory Visit: Payer: Self-pay

## 2022-05-19 ENCOUNTER — Ambulatory Visit: Payer: Self-pay | Admitting: Surgery

## 2022-05-19 DIAGNOSIS — N8111 Cystocele, midline: Secondary | ICD-10-CM | POA: Diagnosis not present

## 2022-05-19 DIAGNOSIS — R39198 Other difficulties with micturition: Secondary | ICD-10-CM | POA: Diagnosis not present

## 2022-05-21 ENCOUNTER — Telehealth: Payer: Self-pay | Admitting: Nurse Practitioner

## 2022-05-21 NOTE — Telephone Encounter (Signed)
Hydrochlorthiazide refill request   Pt has not been seen by tonya so I have scheduled her an appointment for 8/25

## 2022-05-24 ENCOUNTER — Other Ambulatory Visit: Payer: Self-pay

## 2022-05-24 MED ORDER — HYDROCHLOROTHIAZIDE 25 MG PO TABS: 25.0000 mg | ORAL_TABLET | Freq: Every day | ORAL | 3 refills | Status: DC

## 2022-06-11 ENCOUNTER — Ambulatory Visit (INDEPENDENT_AMBULATORY_CARE_PROVIDER_SITE_OTHER): Payer: Medicare Other | Admitting: Nurse Practitioner

## 2022-06-11 ENCOUNTER — Encounter: Payer: Self-pay | Admitting: Nurse Practitioner

## 2022-06-11 VITALS — BP 121/89 | HR 91 | Temp 97.2°F | Ht 67.0 in | Wt 174.2 lb

## 2022-06-11 DIAGNOSIS — I10 Essential (primary) hypertension: Secondary | ICD-10-CM

## 2022-06-11 MED ORDER — IBUPROFEN 800 MG PO TABS: 800.0000 mg | ORAL_TABLET | ORAL | 0 refills | Status: DC | PRN

## 2022-06-11 NOTE — Assessment & Plan Note (Signed)
-   CBC - Comprehensive metabolic panel - Lipid Panel  - low salt diet   Follow up:  Follow up in 6 months or sooner if needed

## 2022-06-11 NOTE — Progress Notes (Signed)
@Patient  ID: , female    DOB: 1951/04/22, 71 y.o.   MRN: [id number]  Chief Complaint  Patient presents with   Follow-up    Pt is here for follow up visit    Referring provider: No ref. provider found   HPI  [id number] presents for follow up. He  has a past medical history of Hyperlipidemia, Hypertension, and Perianal condylomata.   Hypertension:  Currently on HCTZ. Blood pressure at home has been doing good. Overall she states that she has been stable since last visit. No new issues or concerns today. Denies f/c/s, n/v/d, hemoptysis, PND, leg swelling Denies chest pain or edema   No Known Allergies  Immunization History  Administered Date(s) Administered   Moderna SARS-COV2 Booster Vaccination 03/26/2021   Moderna Sars-Covid-2 Vaccination 09/19/2020, 10/17/2020   Pneumococcal Conjugate-13 03/09/2018   Tdap 11/23/2016    Past Medical History:  Diagnosis Date   Hyperlipidemia    Hypertension    Perianal condylomata     Tobacco History: Social History   Tobacco Use  Smoking Status Never  Smokeless Tobacco Never   Counseling given: Not Answered   Outpatient Encounter Medications as of 06/11/2022  Medication Sig   aspirin EC 81 MG tablet Take 1 tablet (81 mg total) by mouth daily.   atorvastatin (LIPITOR) 20 MG tablet Take 1 tablet (20 mg total) by mouth daily.   Cholecalciferol 50 MCG (2000 UT) TABS Take by mouth.   ferrous sulfate 325 (65 FE) MG tablet Take 1 tablet (325 mg total) by mouth 2 (two) times daily with a meal.   hydrochlorothiazide (HYDRODIURIL) 25 MG tablet Take 1 tablet (25 mg total) by mouth daily.   [DISCONTINUED] ibuprofen (ADVIL) 800 MG tablet Take 800 mg by mouth as needed.   ibuprofen (ADVIL) 800 MG tablet Take 1 tablet (800 mg total) by mouth as needed.   Menthol, Topical Analgesic, (BIOFREEZE) 4 % GEL Apply 1 each topically every 6 (six) hours as needed. (Patient not taking: Reported on 04/22/2022)   No  facility-administered encounter medications on file as of 06/11/2022.     Review of Systems  Review of Systems  Constitutional: Negative.   HENT: Negative.    Cardiovascular: Negative.   Gastrointestinal: Negative.   Allergic/Immunologic: Negative.   Neurological: Negative.   Psychiatric/Behavioral: Negative.         Physical Exam  BP 121/89 (BP Location: Left Arm, Patient Position: Sitting, Cuff Size: Large)   Pulse 91   Temp (!) 97.2 F (36.2 C)   Ht 5\' 7"  (1.702 m)   Wt 174 lb 3.2 oz (79 kg)   SpO2 100%   BMI 27.28 kg/m   Wt Readings from Last 5 Encounters:  06/11/22 174 lb 3.2 oz (79 kg)  04/22/22 179 lb 9.6 oz (81.5 kg)  01/26/22 188 lb (85.3 kg)  11/06/21 186 lb 3.2 oz (84.5 kg)  07/24/21 185 lb 6.4 oz (84.1 kg)     Physical Exam Vitals and nursing note reviewed.  Constitutional:      General: She is not in acute distress.    Appearance: She is well-developed.  Cardiovascular:     Rate and Rhythm: Normal rate and regular rhythm.  Pulmonary:     Effort: Pulmonary effort is normal.     Breath sounds: Normal breath sounds.  Neurological:     Mental Status: She is alert and oriented to person, place, and time.      Lab Results:  CBC  Component Value Date/Time   WBC 6.0 07/24/2021 1312   WBC 5.6 01/06/2020 0411   RBC 3.30 (L) 07/24/2021 1312   RBC 2.85 (L) 01/06/2020 0411   HGB 11.5 07/24/2021 1312   HGB 12.3 02/13/2009 0840   HCT 33.3 (L) 07/24/2021 1312   HCT 35.5 02/13/2009 0840   PLT 229 07/24/2021 1312   MCV 101 (H) 07/24/2021 1312   MCV 94.5 02/13/2009 0840   MCH 34.8 (H) 07/24/2021 1312   MCH 33.3 01/06/2020 0411   MCHC 34.5 07/24/2021 1312   MCHC 32.2 01/06/2020 0411   RDW 13.7 07/24/2021 1312   RDW 13.1 02/13/2009 0840   LYMPHSABS 0.7 07/24/2021 1312   LYMPHSABS 1.5 02/13/2009 0840   MONOABS 0.4 01/06/2020 0411   MONOABS 0.2 02/13/2009 0840   EOSABS 0.0 07/24/2021 1312   BASOSABS 0.0 07/24/2021 1312   BASOSABS 0.0  02/13/2009 0840    BMET    Component Value Date/Time   NA 140 07/24/2021 1312   K 4.3 07/24/2021 1312   CL 102 07/24/2021 1312   CO2 28 01/07/2020 0431   GLUCOSE 122 (H) 07/24/2021 1312   GLUCOSE 140 (H) 01/07/2020 0431   BUN 26 07/24/2021 1312   CREATININE 1.12 (H) 07/24/2021 1312   CREATININE 0.83 11/23/2016 1551   CALCIUM 11.3 (H) 07/24/2021 1312   GFRNONAA >60 01/07/2020 0431   GFRNONAA 75 11/23/2016 1551   GFRAA >60 01/07/2020 0431   GFRAA 86 11/23/2016 1551    BNP    Component Value Date/Time   BNP 146.3 (H) 01/06/2020 1030      Assessment & Plan:   Essential hypertension - CBC - Comprehensive metabolic panel - Lipid Panel  - low salt diet   Follow up:  Follow up in 6 months or sooner if needed     Ivonne Andrew, NP 06/11/2022

## 2022-06-11 NOTE — Patient Instructions (Addendum)
1. Essential hypertension  - CBC - Comprehensive metabolic panel - Lipid Panel  - low salt diet   Follow up:  Follow up in 6 months or sooner if needed

## 2022-06-12 LAB — COMPREHENSIVE METABOLIC PANEL WITH GFR
ALT: 12 IU/L (ref 0–32)
AST: 19 IU/L (ref 0–40)
Albumin/Globulin Ratio: 2 (ref 1.2–2.2)
Albumin: 4.8 g/dL (ref 3.9–4.9)
Alkaline Phosphatase: 87 IU/L (ref 44–121)
BUN/Creatinine Ratio: 15 (ref 12–28)
BUN: 21 mg/dL (ref 8–27)
Bilirubin Total: 0.2 mg/dL (ref 0.0–1.2)
CO2: 23 mmol/L (ref 20–29)
Calcium: 11.1 mg/dL — ABNORMAL HIGH (ref 8.7–10.3)
Chloride: 104 mmol/L (ref 96–106)
Creatinine, Ser: 1.41 mg/dL — ABNORMAL HIGH (ref 0.57–1.00)
Globulin, Total: 2.4 g/dL (ref 1.5–4.5)
Glucose: 92 mg/dL (ref 70–99)
Potassium: 3.7 mmol/L (ref 3.5–5.2)
Sodium: 145 mmol/L — ABNORMAL HIGH (ref 134–144)
Total Protein: 7.2 g/dL (ref 6.0–8.5)
eGFR: 40 mL/min/1.73 — ABNORMAL LOW (ref >59)

## 2022-06-12 LAB — CBC
Hematocrit: 37 % (ref 34.0–46.6)
Hemoglobin: 12.8 g/dL (ref 11.1–15.9)
MCH: 34.7 pg — ABNORMAL HIGH (ref 26.6–33.0)
MCHC: 34.6 g/dL (ref 31.5–35.7)
MCV: 100 fL — ABNORMAL HIGH (ref 79–97)
Platelets: 183 10*3/uL (ref 150–450)
RBC: 3.69 x10E6/uL — ABNORMAL LOW (ref 3.77–5.28)
RDW: 13 % (ref 11.7–15.4)
WBC: 3.1 10*3/uL — ABNORMAL LOW (ref 3.4–10.8)

## 2022-06-12 LAB — LIPID PANEL
Chol/HDL Ratio: 3.1 ratio (ref 0.0–4.4)
Cholesterol, Total: 197 mg/dL (ref 100–199)
HDL: 63 mg/dL (ref >39)
LDL Chol Calc (NIH): 108 mg/dL — ABNORMAL HIGH (ref 0–99)
Triglycerides: 150 mg/dL — ABNORMAL HIGH (ref 0–149)
VLDL Cholesterol Cal: 26 mg/dL (ref 5–40)

## 2022-06-17 ENCOUNTER — Other Ambulatory Visit: Payer: Self-pay | Admitting: Nurse Practitioner

## 2022-06-17 DIAGNOSIS — R944 Abnormal results of kidney function studies: Secondary | ICD-10-CM

## 2022-07-22 ENCOUNTER — Encounter: Payer: Self-pay | Admitting: Physician Assistant

## 2022-07-22 ENCOUNTER — Ambulatory Visit (INDEPENDENT_AMBULATORY_CARE_PROVIDER_SITE_OTHER): Payer: Medicare Other | Admitting: Physician Assistant

## 2022-07-22 DIAGNOSIS — M1711 Unilateral primary osteoarthritis, right knee: Secondary | ICD-10-CM | POA: Diagnosis not present

## 2022-07-22 DIAGNOSIS — M1712 Unilateral primary osteoarthritis, left knee: Secondary | ICD-10-CM

## 2022-07-22 MED ORDER — METHYLPREDNISOLONE ACETATE 40 MG/ML IJ SUSP
40.0000 mg | INTRAMUSCULAR | Status: AC | PRN
  Administered 2022-07-22: 40 mg via INTRA_ARTICULAR

## 2022-07-22 MED ORDER — LIDOCAINE HCL 1 % IJ SOLN
3.0000 mL | INTRAMUSCULAR | Status: AC | PRN
  Administered 2022-07-22: 3 mL

## 2022-07-22 NOTE — Progress Notes (Signed)
   Procedure Note  Patient: Hailey Young             Date of Birth: 10/24/1950           MRN: [id number]             Visit Date: 07/22/2022 HPI: Hailey Young comes in today requesting bilateral knee injections.  She was last in given cortisone injections in both knees 6 x 23.  She states last injections helped until recently.  She has had no new falls injuries.  She has known osteoarthritis of both knees.  She is nondiabetic.  Review of systems: See HPI otherwise negative or noncontributory.  General: Well-developed well-nourished female no acute distress.  Ambulates without any assistive device.  Able to get on and off the exam table on her own. Bilateral knees: Good range of motion both knees no abnormal warmth erythema.  Positive effusion both knees.  Procedures: Visit Diagnoses:  1. Unilateral primary osteoarthritis, left knee   2. Unilateral primary osteoarthritis, right knee     Large Joint Inj: bilateral knee on 07/22/2022 4:38 PM Indications: pain Details: 22 G 1.5 in needle, anterolateral approach  Arthrogram: No  Medications (Right): 3 mL lidocaine 1 %; 40 mg methylPREDNISolone acetate 40 MG/ML Aspirate (Right): 42 mL yellow Medications (Left): 3 mL lidocaine 1 %; 40 mg methylPREDNISolone acetate 40 MG/ML Aspirate (Left): 40 mL yellow Outcome: tolerated well, no immediate complications Procedure, treatment alternatives, risks and benefits explained, specific risks discussed. Consent was given by the patient. Immediately prior to procedure a time out was called to verify the correct patient, procedure, equipment, support staff and site/side marked as required. Patient was prepped and draped in the usual sterile fashion.    Plan: She understands to wait least 3 months between injections.  Ace bandages were applied to both knees she will remove these this evening before going to bed.  Questions were encouraged and answered.

## 2022-08-16 ENCOUNTER — Other Ambulatory Visit: Payer: Self-pay | Admitting: Nurse Practitioner

## 2022-08-16 DIAGNOSIS — E785 Hyperlipidemia, unspecified: Secondary | ICD-10-CM

## 2022-10-19 ENCOUNTER — Ambulatory Visit: Payer: Medicare Other | Admitting: Obstetrics and Gynecology

## 2022-10-28 ENCOUNTER — Ambulatory Visit (INDEPENDENT_AMBULATORY_CARE_PROVIDER_SITE_OTHER): Payer: 59

## 2022-10-28 VITALS — Wt 174.0 lb

## 2022-10-28 DIAGNOSIS — Z0000 Encounter for general adult medical examination without abnormal findings: Secondary | ICD-10-CM | POA: Diagnosis not present

## 2022-10-28 NOTE — Progress Notes (Signed)
I connected with  Hailey Young on 10/28/22 by a audio enabled telemedicine application and verified that I am speaking with the correct person using two identifiers.  Patient Location: Home  Provider Location: Office/Clinic  I discussed the limitations of evaluation and management by telemedicine. The patient expressed understanding and agreed to proceed.  Subjective:   Hailey Young is a 72 y.o. female who presents for Medicare Annual (Subsequent) preventive examination.  Review of Systems     Cardiac Risk Factors include: advanced age (>62men, >43 women);hypertension     Objective:    Today's Vitals   10/28/22 1405  Weight: 174 lb (78.9 kg)   Body mass index is 27.25 kg/m.     10/28/2022    2:09 PM 07/13/2021    3:02 PM 01/01/2020    1:59 PM 01/01/2020    8:42 AM 05/20/2017    9:31 AM 02/24/2017    1:11 PM 11/23/2016    2:58 PM  Advanced Directives  Does Patient Have a Medical Advance Directive? No No No No No No No  Would patient like information on creating a medical advance directive? No - Patient declined No - Patient declined No - Patient declined        Current Medications (verified) Outpatient Encounter Medications as of 10/28/2022  Medication Sig   aspirin EC 81 MG tablet Take 1 tablet (81 mg total) by mouth daily.   atorvastatin (LIPITOR) 20 MG tablet TAKE 1 TABLET(20 MG) BY MOUTH DAILY   Cholecalciferol 50 MCG (2000 UT) TABS Take by mouth.   ferrous sulfate 325 (65 FE) MG tablet Take 1 tablet (325 mg total) by mouth 2 (two) times daily with a meal.   hydrochlorothiazide (HYDRODIURIL) 25 MG tablet Take 1 tablet (25 mg total) by mouth daily.   ibuprofen (ADVIL) 800 MG tablet Take 1 tablet (800 mg total) by mouth as needed.   Menthol, Topical Analgesic, (BIOFREEZE) 4 % GEL Apply 1 each topically every 6 (six) hours as needed.   No facility-administered encounter medications on file as of 10/28/2022.    Allergies (verified) Patient has no known allergies.    History: Past Medical History:  Diagnosis Date   Hyperlipidemia    Hypertension    Perianal condylomata    Past Surgical History:  Procedure Laterality Date   ablation of perianal warts     History reviewed. No pertinent family history. Social History   Socioeconomic History   Marital status: Single    Spouse name: Not on file   Number of children: Not on file   Years of education: Not on file   Highest education level: Not on file  Occupational History   Not on file  Tobacco Use   Smoking status: Never   Smokeless tobacco: Never  Vaping Use   Vaping Use: Never used  Substance and Sexual Activity   Alcohol use: No   Drug use: No   Sexual activity: Not Currently  Other Topics Concern   Not on file  Social History Narrative   Lives with son, Never married, 12 th grade education   Social Determinants of Health   Financial Resource Strain: Low Risk  (10/28/2022)   Overall Financial Resource Strain (CARDIA)    Difficulty of Paying Living Expenses: Not hard at all  Food Insecurity: No Food Insecurity (10/28/2022)   Hunger Vital Sign    Worried About Running Out of Food in the Last Year: Never true    Ran Out of Food in the  Last Year: Never true  Transportation Needs: No Transportation Needs (10/28/2022)   PRAPARE - Administrator, Civil Service (Medical): No    Lack of Transportation (Non-Medical): No  Physical Activity: Inactive (10/28/2022)   Exercise Vital Sign    Days of Exercise per Week: 0 days    Minutes of Exercise per Session: 0 min  Stress: No Stress Concern Present (10/28/2022)   [employer] of Occupational Health - Occupational Stress Questionnaire    Feeling of Stress : Not at all  Social Connections: Moderately Isolated (10/28/2022)   Social Connection and Isolation Panel [NHANES]    Frequency of Communication with Friends and Family: Once a week    Frequency of Social Gatherings with Friends and Family: Three times a week    Attends  Religious Services: 1 to 4 times per year    Active Member of Clubs or Organizations: No    Attends Banker Meetings: Never    Marital Status: Never married    Tobacco Counseling Counseling given: Not Answered   Clinical Intake:  Pre-visit preparation completed: Yes  Pain : No/denies pain     BMI - recorded: 27.25 Nutritional Status: BMI 25 -29 Overweight Nutritional Risks: None Diabetes: No  How often do you need to have someone help you when you read instructions, pamphlets, or other written materials from your doctor or pharmacy?: 1 - Never  Diabetic?no  Interpreter Needed?: No  Information entered by :: Lanier Ensign, LPN   Activities of Daily Living    10/28/2022    2:09 PM  In your present state of health, do you have any difficulty performing the following activities:  Hearing? 0  Vision? 0  Difficulty concentrating or making decisions? 0  Walking or climbing stairs? 0  Dressing or bathing? 0  Doing errands, shopping? 0  Preparing Food and eating ? N  Using the Toilet? N  In the past six months, have you accidently leaked urine? N  Do you have problems with loss of bowel control? N  Managing your Medications? N  Managing your Finances? N  Housekeeping or managing your Housekeeping? N    Patient Care Team: Ivonne Andrew, NP as PCP - General (Pulmonary Disease)  Indicate any recent Medical Services you may have received from other than [facility] providers in the past year (date may be approximate).     Assessment:   This is a routine wellness examination for [place].  Hearing/Vision screen Hearing Screening - Comments:: Pt denies any hearing issues  Vision Screening - Comments:: Encouraged to follow up with provider   Dietary issues and exercise activities discussed: Current Exercise Habits: The patient does not participate in regular exercise at present   Goals Addressed             This Visit's Progress    Patient Stated        Stay healthy        Depression Screen    10/28/2022    2:06 PM 06/11/2022   11:02 AM 04/22/2022    9:33 AM 11/06/2021    3:46 PM 07/24/2021   11:42 AM 07/13/2021    3:10 PM 06/26/2020   12:05 PM  PHQ 2/9 Scores  PHQ - 2 Score 0 0 0 0 0 0 0  PHQ- 9 Score   0      Exception Documentation       Medical reason    Fall Risk    10/28/2022    2:09  PM 06/11/2022   11:02 AM 11/06/2021    3:46 PM 07/24/2021   11:42 AM 07/13/2021    3:03 PM  Fall Risk   Falls in the past year? 0 0  0 0  Number falls in past yr: 0 0 0 0   Injury with Fall? 0 0 0 0   Risk for fall due to : Impaired vision No Fall Risks     Follow up Falls prevention discussed Falls evaluation completed       FALL RISK PREVENTION PERTAINING TO THE HOME:  Any stairs in or around the home? No  If so, are there any without handrails? No  Home free of loose throw rugs in walkways, pet beds, electrical cords, etc? Yes  Adequate lighting in your home to reduce risk of falls? Yes   ASSISTIVE DEVICES UTILIZED TO PREVENT FALLS:  Life alert? No  Use of a cane, walker or w/c? No  Grab bars in the bathroom? No  Shower chair or bench in shower? No  Elevated toilet seat or a handicapped toilet? No   TIMED UP AND GO:  Was the test performed? No .   Cognitive Function:        10/28/2022    2:10 PM 07/13/2021    3:04 PM  6CIT Screen  What Year? 0 points 0 points  What month? 0 points 0 points  What time? 0 points 0 points  Count back from 20 0 points 0 points  Months in reverse 4 points 0 points  Repeat phrase 0 points 0 points  Total Score 4 points 0 points    Immunizations Immunization History  Administered Date(s) Administered   Moderna SARS-COV2 Booster Vaccination 03/26/2021   Moderna Sars-Covid-2 Vaccination 09/19/2020, 10/17/2020   Pneumococcal Conjugate-13 03/09/2018   Tdap 11/23/2016    TDAP status: Up to date    Pneumococcal vaccine status: Due, Education has been provided regarding the importance  of this vaccine. Advised may receive this vaccine at local pharmacy or Health Dept. Aware to provide a copy of the vaccination record if obtained from local pharmacy or Health Dept. Verbalized acceptance and understanding.  Covid-19 vaccine status: Completed vaccines  Qualifies for Shingles Vaccine? Yes   Zostavax completed No   Shingrix Completed?: No.    Education has been provided regarding the importance of this vaccine. Patient has been advised to call insurance company to determine out of pocket expense if they have not yet received this vaccine. Advised may also receive vaccine at local pharmacy or Health Dept. Verbalized acceptance and understanding.  Screening Tests Health Maintenance  Topic Date Due   COLONOSCOPY (Pts 45-66yrs Insurance coverage will need to be confirmed)  Never done   Zoster Vaccines- Shingrix (1 of 2) Never done   DEXA SCAN  Never done   MAMMOGRAM  02/18/2019   Pneumonia Vaccine 76+ Years old (2 - PPSV23 or PCV20) 03/10/2019   COVID-19 Vaccine (4 - 2023-24 season) 06/18/2022   Medicare Annual Wellness (AWV)  10/29/2023   DTaP/Tdap/Td (2 - Td or Tdap) 11/23/2026   Hepatitis C Screening  Completed   HPV VACCINES  Aged Out    Health Maintenance  Health Maintenance Due  Topic Date Due   COLONOSCOPY (Pts 45-68yrs Insurance coverage will need to be confirmed)  Never done   Zoster Vaccines- Shingrix (1 of 2) Never done   DEXA SCAN  Never done   MAMMOGRAM  02/18/2019   Pneumonia Vaccine 77+ Years old (2 - PPSV23 or PCV20)  03/10/2019   COVID-19 Vaccine (4 - 2023-24 season) 06/18/2022    Pt stated she completed cologuard  Mammogram and dexa scan declined at this time    Additional Screening:  Hepatitis C Screening:  Completed 01/03/20  Vision Screening: Recommended annual ophthalmology exams for early detection of glaucoma and other disorders of the eye. Is the patient up to date with their annual eye exam?  No  Who is the provider or what is the name  of the office in which the patient attends annual eye exams? Encouraged to follow up with provider  If pt is not established with a provider, would they like to be referred to a provider to establish care? No .   Dental Screening: Recommended annual dental exams for proper oral hygiene  Community Resource Referral / Chronic Care Management: CRR required this visit?  No   CCM required this visit?  No      Plan:     I have personally reviewed and noted the following in the patient's chart:   Medical and social history Use of alcohol, tobacco or illicit drugs  Current medications and supplements including opioid prescriptions. Patient is not currently taking opioid prescriptions. Functional ability and status Nutritional status Physical activity Advanced directives List of other physicians Hospitalizations, surgeries, and ER visits in previous 12 months Vitals Screenings to include cognitive, depression, and falls Referrals and appointments  In addition, I have reviewed and discussed with patient certain preventive protocols, quality metrics, and best practice recommendations. A written personalized care plan for preventive services as well as general preventive health recommendations were provided to patient.     Willette Brace, LPN   5/57/3220   Nurse Notes: none

## 2022-10-28 NOTE — Patient Instructions (Signed)
Hailey Young , Thank you for taking time to come for your Medicare Wellness Visit. I appreciate your ongoing commitment to your health goals. Please review the following plan we discussed and let me know if I can assist you in the future.   These are the goals we discussed:  Goals      Patient Stated     Stay healthy         This is a list of the screening recommended for you and due dates:  Health Maintenance  Topic Date Due   Colon Cancer Screening  Never done   Zoster (Shingles) Vaccine (1 of 2) Never done   DEXA scan (bone density measurement)  Never done   Mammogram  02/18/2019   Pneumonia Vaccine (2 - PPSV23 or PCV20) 03/10/2019   COVID-19 Vaccine (4 - 2023-24 season) 06/18/2022   Medicare Annual Wellness Visit  10/29/2023   DTaP/Tdap/Td vaccine (2 - Td or Tdap) 11/23/2026   Hepatitis C Screening: USPSTF Recommendation to screen - Ages 69-79 yo.  Completed   HPV Vaccine  Aged Out    Advanced directives: Advance directive discussed with you today. I have provided a copy for you to complete at home and have notarized. Once this is complete please bring a copy in to our office so we can scan it into your chart.  Conditions/risks identified: stay healthy   Next appointment: Follow up in one year for your annual wellness visit    Preventive Care 65 Years and Older, Female Preventive care refers to lifestyle choices and visits with your health care provider that can promote health and wellness. What does preventive care include? A yearly physical exam. This is also called an annual well check. Dental exams once or twice a year. Routine eye exams. Ask your health care provider how often you should have your eyes checked. Personal lifestyle choices, including: Daily care of your teeth and gums. Regular physical activity. Eating a healthy diet. Avoiding tobacco and drug use. Limiting alcohol use. Practicing safe sex. Taking low-dose aspirin every day. Taking vitamin and  mineral supplements as recommended by your health care provider. What happens during an annual well check? The services and screenings done by your health care provider during your annual well check will depend on your age, overall health, lifestyle risk factors, and family history of disease. Counseling  Your health care provider may ask you questions about your: Alcohol use. Tobacco use. Drug use. Emotional well-being. Home and relationship well-being. Sexual activity. Eating habits. History of falls. Memory and ability to understand (cognition). Work and work Statistician. Reproductive health. Screening  You may have the following tests or measurements: Height, weight, and BMI. Blood pressure. Lipid and cholesterol levels. These may be checked every 5 years, or more frequently if you are over 8 years old. Skin check. Lung cancer screening. You may have this screening every year starting at age 67 if you have a 30-pack-year history of smoking and currently smoke or have quit within the past 15 years. Fecal occult blood test (FOBT) of the stool. You may have this test every year starting at age 86. Flexible sigmoidoscopy or colonoscopy. You may have a sigmoidoscopy every 5 years or a colonoscopy every 10 years starting at age 50. Hepatitis C blood test. Hepatitis B blood test. Sexually transmitted disease (STD) testing. Diabetes screening. This is done by checking your blood sugar (glucose) after you have not eaten for a while (fasting). You may have this done every 1-3 years.  Bone density scan. This is done to screen for osteoporosis. You may have this done starting at age 76. Mammogram. This may be done every 1-2 years. Talk to your health care provider about how often you should have regular mammograms. Talk with your health care provider about your test results, treatment options, and if necessary, the need for more tests. Vaccines  Your health care provider may recommend  certain vaccines, such as: Influenza vaccine. This is recommended every year. Tetanus, diphtheria, and acellular pertussis (Tdap, Td) vaccine. You may need a Td booster every 10 years. Zoster vaccine. You may need this after age 92. Pneumococcal 13-valent conjugate (PCV13) vaccine. One dose is recommended after age 36. Pneumococcal polysaccharide (PPSV23) vaccine. One dose is recommended after age 23. Talk to your health care provider about which screenings and vaccines you need and how often you need them. This information is not intended to replace advice given to you by your health care provider. Make sure you discuss any questions you have with your health care provider. Document Released: 10/31/2015 Document Revised: 06/23/2016 Document Reviewed: 08/05/2015 Elsevier Interactive Patient Education  2017 [employer] Prevention in the Home Falls can cause injuries. They can happen to people of all ages. There are many things you can do to make your home safe and to help prevent falls. What can I do on the outside of my home? Regularly fix the edges of walkways and driveways and fix any cracks. Remove anything that might make you trip as you walk through a door, such as a raised step or threshold. Trim any bushes or trees on the path to your home. Use bright outdoor lighting. Clear any walking paths of anything that might make someone trip, such as rocks or tools. Regularly check to see if handrails are loose or broken. Make sure that both sides of any steps have handrails. Any raised decks and porches should have guardrails on the edges. Have any leaves, snow, or ice cleared regularly. Use sand or salt on walking paths during winter. Clean up any spills in your garage right away. This includes oil or grease spills. What can I do in the bathroom? Use night lights. Install grab bars by the toilet and in the tub and shower. Do not use towel bars as grab bars. Use non-skid mats or  decals in the tub or shower. If you need to sit down in the shower, use a plastic, non-slip stool. Keep the floor dry. Clean up any water that spills on the floor as soon as it happens. Remove soap buildup in the tub or shower regularly. Attach bath mats securely with double-sided non-slip rug tape. Do not have throw rugs and other things on the floor that can make you trip. What can I do in the bedroom? Use night lights. Make sure that you have a light by your bed that is easy to reach. Do not use any sheets or blankets that are too big for your bed. They should not hang down onto the floor. Have a firm chair that has side arms. You can use this for support while you get dressed. Do not have throw rugs and other things on the floor that can make you trip. What can I do in the kitchen? Clean up any spills right away. Avoid walking on wet floors. Keep items that you use a lot in easy-to-reach places. If you need to reach something above you, use a strong step stool that has a grab bar.  Keep electrical cords out of the way. Do not use floor polish or wax that makes floors slippery. If you must use wax, use non-skid floor wax. Do not have throw rugs and other things on the floor that can make you trip. What can I do with my stairs? Do not leave any items on the stairs. Make sure that there are handrails on both sides of the stairs and use them. Fix handrails that are broken or loose. Make sure that handrails are as long as the stairways. Check any carpeting to make sure that it is firmly attached to the stairs. Fix any carpet that is loose or worn. Avoid having throw rugs at the top or bottom of the stairs. If you do have throw rugs, attach them to the floor with carpet tape. Make sure that you have a light switch at the top of the stairs and the bottom of the stairs. If you do not have them, ask someone to add them for you. What else can I do to help prevent falls? Wear shoes that: Do not  have high heels. Have rubber bottoms. Are comfortable and fit you well. Are closed at the toe. Do not wear sandals. If you use a stepladder: Make sure that it is fully opened. Do not climb a closed stepladder. Make sure that both sides of the stepladder are locked into place. Ask someone to hold it for you, if possible. Clearly mark and make sure that you can see: Any grab bars or handrails. First and last steps. Where the edge of each step is. Use tools that help you move around (mobility aids) if they are needed. These include: Canes. Walkers. Scooters. Crutches. Turn on the lights when you go into a dark area. Replace any light bulbs as soon as they burn out. Set up your furniture so you have a clear path. Avoid moving your furniture around. If any of your floors are uneven, fix them. If there are any pets around you, be aware of where they are. Review your medicines with your doctor. Some medicines can make you feel dizzy. This can increase your chance of falling. Ask your doctor what other things that you can do to help prevent falls. This information is not intended to replace advice given to you by your health care provider. Make sure you discuss any questions you have with your health care provider. Document Released: 07/31/2009 Document Revised: 03/11/2016 Document Reviewed: 11/08/2014 Elsevier Interactive Patient Education  2017 [employer].

## 2022-11-04 ENCOUNTER — Other Ambulatory Visit: Payer: Self-pay | Admitting: Nephrology

## 2022-11-04 ENCOUNTER — Telehealth: Payer: Self-pay

## 2022-11-04 DIAGNOSIS — I129 Hypertensive chronic kidney disease with stage 1 through stage 4 chronic kidney disease, or unspecified chronic kidney disease: Secondary | ICD-10-CM

## 2022-11-04 DIAGNOSIS — N1832 Chronic kidney disease, stage 3b: Secondary | ICD-10-CM

## 2022-11-04 DIAGNOSIS — N390 Urinary tract infection, site not specified: Secondary | ICD-10-CM | POA: Diagnosis not present

## 2022-11-04 DIAGNOSIS — R739 Hyperglycemia, unspecified: Secondary | ICD-10-CM | POA: Diagnosis not present

## 2022-11-04 NOTE — Telephone Encounter (Signed)
Called patient to ask about mammogram but there was no answer and no voicemail. Will mail letter.

## 2022-11-11 ENCOUNTER — Ambulatory Visit (INDEPENDENT_AMBULATORY_CARE_PROVIDER_SITE_OTHER): Payer: 59 | Admitting: Physician Assistant

## 2022-11-11 ENCOUNTER — Encounter: Payer: Self-pay | Admitting: Physician Assistant

## 2022-11-11 DIAGNOSIS — M1711 Unilateral primary osteoarthritis, right knee: Secondary | ICD-10-CM

## 2022-11-11 DIAGNOSIS — M1712 Unilateral primary osteoarthritis, left knee: Secondary | ICD-10-CM

## 2022-11-11 MED ORDER — METHYLPREDNISOLONE ACETATE 40 MG/ML IJ SUSP
40.0000 mg | INTRAMUSCULAR | Status: AC | PRN
  Administered 2022-11-11: 40 mg via INTRA_ARTICULAR

## 2022-11-11 MED ORDER — LIDOCAINE HCL 1 % IJ SOLN
4.0000 mL | INTRAMUSCULAR | Status: AC | PRN
  Administered 2022-11-11: 4 mL

## 2022-11-11 NOTE — Progress Notes (Signed)
   Procedure Note  Patient: Hailey Young             Date of Birth: 10/15/1951           MRN: [id number]             Visit Date: 11/11/2022 HPI: Hailey Young comes in today requesting injections aspiration of both knees.  Last injections aspirations were performed 07/22/2022.  She has known osteoarthritis of both knees.  She is nondiabetic.  Denies any injuries.  Physical exam: General well-developed well-nourished female no acute distress ambulates without any assistive device. Bilateral knees: Good range of motion both knees no abnormal warmth erythema.  Bilateral effusions.  Procedures: Visit Diagnoses:  1. Unilateral primary osteoarthritis, left knee   2. Unilateral primary osteoarthritis, right knee     Large Joint Inj: bilateral knee on 11/11/2022 1:25 PM Indications: pain Details: 22 G 1.5 in needle, anterolateral approach  Arthrogram: No  Medications (Right): 4 mL lidocaine 1 %; 40 mg methylPREDNISolone acetate 40 MG/ML Aspirate (Right): 50 mL yellow and blood-tinged Medications (Left): 4 mL lidocaine 1 %; 40 mg methylPREDNISolone acetate 40 MG/ML Aspirate (Left): 43 mL yellow Outcome: tolerated well, no immediate complications Procedure, treatment alternatives, risks and benefits explained, specific risks discussed. Consent was given by the patient. Immediately prior to procedure a time out was called to verify the correct patient, procedure, equipment, support staff and site/side marked as required. Patient was prepped and draped in the usual sterile fashion.     Plan: She knows to wait at least 3 months between injections.  She will continue to take Tylenol for pain.  Both knees were wrapped with Ace bandages which she will remove this evening before returning to bed.  Follow-up as needed.

## 2022-12-17 ENCOUNTER — Ambulatory Visit: Payer: Self-pay | Admitting: Nurse Practitioner

## 2023-02-13 ENCOUNTER — Other Ambulatory Visit: Payer: Self-pay | Admitting: Nurse Practitioner

## 2023-02-13 DIAGNOSIS — E785 Hyperlipidemia, unspecified: Secondary | ICD-10-CM

## 2023-02-14 ENCOUNTER — Encounter: Payer: Self-pay | Admitting: Physician Assistant

## 2023-02-14 ENCOUNTER — Ambulatory Visit (INDEPENDENT_AMBULATORY_CARE_PROVIDER_SITE_OTHER): Payer: 59 | Admitting: Physician Assistant

## 2023-02-14 DIAGNOSIS — M1712 Unilateral primary osteoarthritis, left knee: Secondary | ICD-10-CM

## 2023-02-14 DIAGNOSIS — M1711 Unilateral primary osteoarthritis, right knee: Secondary | ICD-10-CM | POA: Diagnosis not present

## 2023-02-14 MED ORDER — LIDOCAINE HCL 1 % IJ SOLN
5.0000 mL | INTRAMUSCULAR | Status: AC | PRN
Start: 2023-02-14 — End: 2023-02-14
  Administered 2023-02-14: 5 mL

## 2023-02-14 MED ORDER — METHYLPREDNISOLONE ACETATE 40 MG/ML IJ SUSP
40.0000 mg | INTRAMUSCULAR | Status: AC | PRN
Start: 2023-02-14 — End: 2023-02-14
  Administered 2023-02-14: 40 mg via INTRA_ARTICULAR

## 2023-02-14 NOTE — Progress Notes (Signed)
   Procedure Note  Patient: Hailey Young             Date of Birth: July 22, 1951           MRN: [id number]             Visit Date: 02/14/2023 HPI: Hailey Young comes in today requesting injections both knees.  She was last seen on 11/11/2022 and states that the injections aspirations helped for about 3 months.  She notes swelling in both knees.  No new injuries or falls.  Denies any fevers chills.  She has known osteoarthritis of both knees.  She is not diabetic.  Physical exam: General no acute distress mood affect appropriate Bilateral knees: Good range of motion both knees no abnormal warmth erythema.  Effusion both knees.  Procedures: Visit Diagnoses:  1. Unilateral primary osteoarthritis, left knee   2. Unilateral primary osteoarthritis, right knee     Large Joint Inj: bilateral knee on 02/14/2023 2:49 PM Indications: pain Details: 22 G 1.5 in needle, anterolateral approach  Arthrogram: No  Medications (Right): 5 mL lidocaine 1 %; 40 mg methylPREDNISolone acetate 40 MG/ML Aspirate (Right): 48 mL yellow and blood-tinged Medications (Left): 5 mL lidocaine 1 %; 40 mg methylPREDNISolone acetate 40 MG/ML Aspirate (Left): 44 mL yellow Outcome: tolerated well, no immediate complications Procedure, treatment alternatives, risks and benefits explained, specific risks discussed. Consent was given by the patient. Immediately prior to procedure a time out was called to verify the correct patient, procedure, equipment, support staff and site/side marked as required. Patient was prepped and draped in the usual sterile fashion.    Plan: She will follow-up with [place] as needed she knows to wait at least 3 months between injections.  Questions were encouraged and answered at length

## 2023-05-14 ENCOUNTER — Other Ambulatory Visit: Payer: Self-pay | Admitting: Nurse Practitioner

## 2023-05-24 ENCOUNTER — Ambulatory Visit (INDEPENDENT_AMBULATORY_CARE_PROVIDER_SITE_OTHER): Payer: 59 | Admitting: Physician Assistant

## 2023-05-24 DIAGNOSIS — M1712 Unilateral primary osteoarthritis, left knee: Secondary | ICD-10-CM

## 2023-05-24 DIAGNOSIS — M1711 Unilateral primary osteoarthritis, right knee: Secondary | ICD-10-CM | POA: Diagnosis not present

## 2023-05-24 MED ORDER — LIDOCAINE HCL 1 % IJ SOLN
5.0000 mL | INTRAMUSCULAR | Status: AC | PRN
Start: 2023-05-24 — End: 2023-05-24
  Administered 2023-05-24: 5 mL

## 2023-05-24 MED ORDER — METHYLPREDNISOLONE ACETATE 40 MG/ML IJ SUSP
40.0000 mg | INTRAMUSCULAR | Status: AC | PRN
Start: 2023-05-24 — End: 2023-05-24
  Administered 2023-05-24: 40 mg via INTRA_ARTICULAR

## 2023-05-24 NOTE — Progress Notes (Signed)
   Procedure Note  Patient: Hailey Young             Date of Birth: Apr 26, 1951           MRN: [id number]             Visit Date: 05/24/2023  Hailey Young comes in today requesting cortisone injections both knees.  She has known osteoarthritis both knees.  Last injections were on 02/14/2023.  States the injections lasted for greater than 3 months.  She has had no new injury to either knee.  Denies any fevers or chills.  Physical exam: Bilateral knees full range of motion.  She has varus deformities of both knees.  No abnormal warmth erythema of either knee.  Positive effusion bilateral knees. Procedures: Visit Diagnoses:  1. Unilateral primary osteoarthritis, left knee   2. Unilateral primary osteoarthritis, right knee     Large Joint Inj: bilateral knee on 05/24/2023 12:48 PM Indications: pain Details: 22 G 1.5 in needle, anterolateral approach  Arthrogram: No  Medications (Right): 5 mL lidocaine 1 %; 40 mg methylPREDNISolone acetate 40 MG/ML Aspirate (Right): 33 mL yellow and blood-tinged Medications (Left): 5 mL lidocaine 1 %; 40 mg methylPREDNISolone acetate 40 MG/ML Aspirate (Left): 40 mL yellow and blood-tinged Outcome: tolerated well, no immediate complications Procedure, treatment alternatives, risks and benefits explained, specific risks discussed. Consent was given by the patient. Immediately prior to procedure a time out was called to verify the correct patient, procedure, equipment, support staff and site/side marked as required. Patient was prepped and draped in the usual sterile fashion.     Plan: She will follow-up with [place] as needed knows to wait at least 3 months between injections.  Questions were encouraged and answered.

## 2023-07-25 ENCOUNTER — Telehealth: Payer: Self-pay | Admitting: Nurse Practitioner

## 2023-07-25 ENCOUNTER — Other Ambulatory Visit: Payer: Self-pay | Admitting: Nurse Practitioner

## 2023-07-25 DIAGNOSIS — E785 Hyperlipidemia, unspecified: Secondary | ICD-10-CM

## 2023-07-25 NOTE — Telephone Encounter (Signed)
Contacted pt to go over provider response someone picked up the phone and hung up. Tried contacting pt again and phone just kept ringing

## 2023-07-25 NOTE — Telephone Encounter (Signed)
I refilled patient's medication today for a 30 day supply. No more refills will be issued without a follow up appointment. She has not been here in over 1 year.

## 2023-08-22 ENCOUNTER — Encounter: Payer: Self-pay | Admitting: Physician Assistant

## 2023-08-22 ENCOUNTER — Ambulatory Visit (INDEPENDENT_AMBULATORY_CARE_PROVIDER_SITE_OTHER): Payer: 59 | Admitting: Physician Assistant

## 2023-08-22 DIAGNOSIS — M1711 Unilateral primary osteoarthritis, right knee: Secondary | ICD-10-CM

## 2023-08-22 DIAGNOSIS — M1712 Unilateral primary osteoarthritis, left knee: Secondary | ICD-10-CM

## 2023-08-22 MED ORDER — LIDOCAINE HCL 1 % IJ SOLN
5.0000 mL | INTRAMUSCULAR | Status: AC | PRN
Start: 2023-08-22 — End: 2023-08-22
  Administered 2023-08-22: 5 mL

## 2023-08-22 MED ORDER — METHYLPREDNISOLONE ACETATE 40 MG/ML IJ SUSP
40.0000 mg | INTRAMUSCULAR | Status: AC | PRN
Start: 2023-08-22 — End: 2023-08-22
  Administered 2023-08-22: 40 mg via INTRA_ARTICULAR

## 2023-08-22 NOTE — Progress Notes (Signed)
   Procedure Note  Patient: Hailey Young             Date of Birth: 1951/04/10           MRN: [id number]             Visit Date: 08/22/2023 HPI: [name] comes in today requesting cortisone injections both knees and aspirations.  Last office visit was 05/24/2023 and she had bilateral knee aspiration and injections.  She has had no new injuries.  Denies any fevers chills or ongoing infections.  She is taking Tylenol for knee pain.  She has known osteoarthritis both knees.  Review of systems: See HPI otherwise negative  Physical exam: Bilateral knees good range of motion.  Varus deformities both knees.  No abnormal warmth erythema of either knee.  Positive effusion bilaterally. Procedures: Visit Diagnoses:  1. Unilateral primary osteoarthritis, left knee     Large Joint Inj: bilateral knee on 08/22/2023 11:37 AM Indications: pain Details: 22 G 1.5 in needle, superolateral approach  Arthrogram: No  Medications (Right): 5 mL lidocaine 1 %; 40 mg methylPREDNISolone acetate 40 MG/ML Aspirate (Right): 40 mL yellow Medications (Left): 5 mL lidocaine 1 %; 40 mg methylPREDNISolone acetate 40 MG/ML Aspirate (Left): 33 mL yellow Outcome: tolerated well, no immediate complications Procedure, treatment alternatives, risks and benefits explained, specific risks discussed. Consent was given by the patient. Immediately prior to procedure a time out was called to verify the correct patient, procedure, equipment, support staff and site/side marked as required. Patient was prepped and draped in the usual sterile fashion.     Plan: She knows to wait a full 3 months between injections.  Follow-up as needed.  Questions encouraged and answered at length.

## 2023-08-23 ENCOUNTER — Other Ambulatory Visit: Payer: Self-pay | Admitting: Nurse Practitioner

## 2023-08-23 DIAGNOSIS — E785 Hyperlipidemia, unspecified: Secondary | ICD-10-CM

## 2023-12-27 ENCOUNTER — Encounter: Payer: Self-pay | Admitting: Physician Assistant

## 2023-12-27 ENCOUNTER — Ambulatory Visit (INDEPENDENT_AMBULATORY_CARE_PROVIDER_SITE_OTHER): Payer: 59 | Admitting: Physician Assistant

## 2023-12-27 DIAGNOSIS — M1711 Unilateral primary osteoarthritis, right knee: Secondary | ICD-10-CM

## 2023-12-27 DIAGNOSIS — M170 Bilateral primary osteoarthritis of knee: Secondary | ICD-10-CM

## 2023-12-27 DIAGNOSIS — M1712 Unilateral primary osteoarthritis, left knee: Secondary | ICD-10-CM

## 2023-12-27 MED ORDER — LIDOCAINE HCL 1 % IJ SOLN
4.0000 mL | INTRAMUSCULAR | Status: AC | PRN
Start: 2023-12-27 — End: 2023-12-27
  Administered 2023-12-27: 4 mL

## 2023-12-27 MED ORDER — METHYLPREDNISOLONE ACETATE 40 MG/ML IJ SUSP
40.0000 mg | INTRAMUSCULAR | Status: AC | PRN
Start: 2023-12-27 — End: 2023-12-27
  Administered 2023-12-27: 40 mg via INTRA_ARTICULAR

## 2023-12-27 MED ORDER — METHYLPREDNISOLONE ACETATE 40 MG/ML IJ SUSP
40.0000 mg | INTRAMUSCULAR | Status: AC | PRN
  Administered 2023-12-27: 40 mg via INTRA_ARTICULAR

## 2023-12-27 MED ORDER — LIDOCAINE HCL 1 % IJ SOLN
4.0000 mL | INTRAMUSCULAR | Status: AC | PRN
  Administered 2023-12-27: 4 mL

## 2023-12-27 MED ORDER — TRAMADOL HCL 50 MG PO TABS
50.0000 mg | ORAL_TABLET | Freq: Four times a day (QID) | ORAL | 0 refills | Status: DC | PRN
Start: 2023-12-27 — End: 2024-01-23

## 2023-12-27 NOTE — Progress Notes (Signed)
 Office Visit Note   Patient: Hailey Young           Date of Birth: 01-12-1951           MRN: [id number] Visit Date: 12/27/2023              Requested by: Ivonne Andrew, NP [phone] [address] [place],  [place] [address] PCP: Ivonne Andrew, NP   Assessment & Plan: Visit Diagnoses:  1. Unilateral primary osteoarthritis, left knee   2. Unilateral primary osteoarthritis, right knee     Plan: She understands to wait 3 months between cortisone injections.  She is unable to take NSAIDs.  Therefore she is given tramadol for knee pain and is to use this sparingly.  Questions were encouraged and answered at length.  Follow-Up Instructions: No follow-ups on file.   Orders:  Orders Placed This Encounter  Procedures   Large Joint Inj: bilateral knee   Meds ordered this encounter  Medications   traMADol (ULTRAM) 50 MG tablet    Sig: Take 1 tablet (50 mg total) by mouth every 6 (six) hours as needed.    Dispense:  30 tablet    Refill:  0      Procedures: Large Joint Inj: bilateral knee on 12/27/2023 6:04 PM Indications: pain Details: 22 G 1.5 in needle, superolateral approach  Arthrogram: No  Medications (Right): 4 mL lidocaine 1 %; 40 mg methylPREDNISolone acetate 40 MG/ML Aspirate (Right): 36 mL yellow Medications (Left): 4 mL lidocaine 1 %; 40 mg methylPREDNISolone acetate 40 MG/ML Aspirate (Left): 33 mL yellow Outcome: tolerated well, no immediate complications Procedure, treatment alternatives, risks and benefits explained, specific risks discussed. Consent was given by the patient. Immediately prior to procedure a time out was called to verify the correct patient, procedure, equipment, support staff and site/side marked as required. Patient was prepped and draped in the usual sterile fashion.       Clinical Data: No additional findings.   Subjective: Chief Complaint  Patient presents with   Left Knee - Pain    08/22/23 Bilateral knee cortisone  injections   Right Knee - Pain    08/22/23 Bilateral knee cortisone injections    HPI Hailey Young comes in today requesting bilateral knee injections.  Last injections were 08/22/2023.  She states these were helpful until recently.  Right knee pain is worse than the left.  She has had no known injury to either knee.  She is nondiabetic.  Denies any fevers or chills or ongoing infections. Review of Systems See HPI  Objective: Vital Signs: There were no vitals taken for this visit.  Physical Exam General well-developed well-nourished female noted acute distress ambulates without assistive device. Ortho Exam Bilateral knees: No abnormal warmth or erythema.  Good range of motion of both knees.  Positive effusion bilaterally.  Varus deformities both knees. Specialty Comments:  No specialty comments available.  Imaging: No results found.   PMFS History: Patient Active Problem List   Diagnosis Date Noted   Pelvic relaxation due to uterovaginal prolapse 04/22/2022   Perianal lesion 04/22/2022   Essential hypertension 06/26/2020   Pneumonia due to COVID-19 virus 01/01/2020   Acute respiratory failure with hypoxemia (HCC) 01/01/2020   Pancytopenia (HCC) 01/01/2020   AKI (acute kidney injury) (HCC) 01/01/2020   Chronic pain of left knee 05/05/2017   Chronic pain of right knee 05/05/2017   Unilateral primary osteoarthritis, left knee 05/05/2017   Unilateral primary osteoarthritis, right knee 02/02/2017  Elevated blood-pressure reading without diagnosis of hypertension 12/08/2016   Obesity (BMI 30-39.9) 11/23/2016   Metabolic syndrome 11/23/2016   Arthritis 11/23/2016   Anemia, iron deficiency 07/05/2015   Obesity 07/05/2015   Anemia 07/04/2015   Symptomatic anemia 07/04/2015   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 No pertinent family  history.  Past Surgical History:  Procedure Laterality Date   ablation of perianal warts     Social History   Occupational History   Not on file  Tobacco Use   Smoking status: Never   Smokeless tobacco: Never  Vaping Use   Vaping status: Never Used  Substance and Sexual Activity   Alcohol use: No   Drug use: No   Sexual activity: Not Currently

## 2023-12-27 NOTE — Progress Notes (Signed)
   Procedure Note  Patient: Hailey Young             Date of Birth: 1950-10-27           MRN: [id number]             Visit Date: 12/27/2023 HPI: Hailey Young comes in today requesting cortisone injections both knees.  Last injections were 08/22/2023 normal helpful until recently.  No known injury.  She is also asking for some pain medication.  She is unable to take NSAIDs due to history of acute kidney injury.  She is nondiabetic.  No fevers or chills.  Physical exam: General Well-developed well-nourished female no acute distress mood and affect appropriate. Bilateral knees: Varus deformities bilaterally.  No abnormal warmth erythema of either knee.  Both knees with effusions today on exam. Procedures: Visit Diagnoses:  1. Unilateral primary osteoarthritis, left knee   2. Unilateral primary osteoarthritis, right knee     Large Joint Inj: bilateral knee on 12/27/2023 5:55 PM Indications: pain Details: 22 G 1.5 in needle, superolateral approach  Arthrogram: No  Medications (Right): 4 mL lidocaine 1 %; 40 mg methylPREDNISolone acetate 40 MG/ML Aspirate (Right): 36 mL yellow Medications (Left): 4 mL lidocaine 1 %; 40 mg methylPREDNISolone acetate 40 MG/ML Aspirate (Left): 33 mL yellow Outcome: tolerated well, no immediate complications Procedure, treatment alternatives, risks and benefits explained, specific risks discussed. Consent was given by the patient. Immediately prior to procedure a time out was called to verify the correct patient, procedure, equipment, support staff and site/side marked as required. Patient was prepped and draped in the usual sterile fashion.     Plan: She knows to wait at least 3 months between injections.  She was given tramadol to use sparingly for knee pain.  Questions encouraged and answered at length.  Of note she has tried viscosupplementation injections in the past which did not give her good relief.

## 2024-01-23 ENCOUNTER — Telehealth: Payer: Self-pay | Admitting: Physician Assistant

## 2024-01-23 ENCOUNTER — Other Ambulatory Visit: Payer: Self-pay | Admitting: Physician Assistant

## 2024-01-23 MED ORDER — TRAMADOL HCL 50 MG PO TABS: 50.0000 mg | ORAL_TABLET | Freq: Four times a day (QID) | ORAL | 0 refills | Status: DC | PRN

## 2024-01-23 NOTE — Telephone Encounter (Signed)
 Patient called. Would like a refill on her Tramadol.

## 2024-04-02 ENCOUNTER — Encounter: Payer: Self-pay | Admitting: Physician Assistant

## 2024-04-02 ENCOUNTER — Ambulatory Visit (INDEPENDENT_AMBULATORY_CARE_PROVIDER_SITE_OTHER): Admitting: Physician Assistant

## 2024-04-02 DIAGNOSIS — M1711 Unilateral primary osteoarthritis, right knee: Secondary | ICD-10-CM

## 2024-04-02 DIAGNOSIS — M1712 Unilateral primary osteoarthritis, left knee: Secondary | ICD-10-CM

## 2024-04-02 DIAGNOSIS — M170 Bilateral primary osteoarthritis of knee: Secondary | ICD-10-CM

## 2024-04-02 MED ORDER — LIDOCAINE HCL 1 % IJ SOLN
5.0000 mL | INTRAMUSCULAR | Status: AC | PRN
  Administered 2024-04-02: 5 mL

## 2024-04-02 MED ORDER — METHYLPREDNISOLONE ACETATE 40 MG/ML IJ SUSP: 40.0000 mg | INTRAMUSCULAR | Status: AC | PRN

## 2024-04-02 MED ORDER — LIDOCAINE HCL 1 % IJ SOLN: 5.0000 mL | INTRAMUSCULAR | Status: AC | PRN

## 2024-04-02 NOTE — Progress Notes (Signed)
   Procedure Note  Patient: Hailey Young             Date of Birth: 15-Nov-1950           MRN: [id number]             Visit Date: 04/02/2024 HPI: Hailey Young returns today requesting bilateral knee aspiration injections.  Last injections were given on 12/27/2023 and were helpful.  She has had no new injuries to either knee.  Denies any fevers chills or ongoing infections.  She also asked for refill on her tramadol  which she takes occasionally for knee pain.  Review of systems see HPI otherwise negative Physical exam: Bilateral knees good range of motion no abnormal warmth or erythema.  Positive effusion both knees.  Procedures: Visit Diagnoses:  1. Unilateral primary osteoarthritis, left knee   2. Unilateral primary osteoarthritis, right knee     Large Joint Inj: bilateral knee on 04/02/2024 2:53 PM Indications: pain Details: 22 G 1.5 in needle, superolateral approach  Arthrogram: No  Medications (Right): 5 mL lidocaine  1 %; 40 mg methylPREDNISolone  acetate 40 MG/ML Aspirate (Right): 43 mL yellow and blood-tinged Medications (Left): 5 mL lidocaine  1 %; 40 mg methylPREDNISolone  acetate 40 MG/ML Aspirate (Left): 47 mL yellow and blood-tinged Outcome: tolerated well, no immediate complications Procedure, treatment alternatives, risks and benefits explained, specific risks discussed. Consent was given by the patient. Immediately prior to procedure a time out was called to verify the correct patient, procedure, equipment, support staff and site/side marked as required. Patient was prepped and draped in the usual sterile fashion.    Plan: She knows to wait at least 3 months between injections.  Tramadol  was sent in for her today she is knows to use it sparingly.

## 2024-04-05 ENCOUNTER — Telehealth: Payer: Self-pay | Admitting: Physician Assistant

## 2024-04-05 ENCOUNTER — Other Ambulatory Visit: Payer: Self-pay | Admitting: Physician Assistant

## 2024-04-05 MED ORDER — TRAMADOL HCL 50 MG PO TABS: 50.0000 mg | ORAL_TABLET | Freq: Four times a day (QID) | ORAL | 0 refills | Status: DC | PRN

## 2024-04-05 NOTE — Telephone Encounter (Signed)
 Pt called stated PA Fulton Job was to send her tramadol  in to her pharmacy. Please send to [facility]. Pt phone number is [phone]

## 2024-05-01 ENCOUNTER — Ambulatory Visit: Payer: Self-pay

## 2024-05-01 NOTE — Telephone Encounter (Signed)
 FYI Only or Action Required?: FYI only for provider.  Patient was last seen in primary care on 06/11/2022 by Oley Bascom RAMAN, NP.  Called Nurse Triage reporting Abdominal Pain. - diarrhea. BM was dark a months ago - now normal color.  Symptoms began about a month ago.  Interventions attempted: Nothing.  Symptoms are: gradually worsening.  Triage Disposition: See Physician Within 24 Hours  Patient/caregiver understands and will follow disposition?: Yes                          Patient has had diarrhea for over a month. Patient has also had abdominal pain. Daughter requesting appointment as soon as possible.   Please reach out to patient on home number, [phone]  Reason for Disposition  [1] MODERATE pain (e.g., interferes with normal activities) AND [2] pain comes and goes (cramps) AND [3] present > 24 hours  (Exception: Pain with Vomiting or Diarrhea - see that Guideline.)  Answer Assessment - Initial Assessment Questions 1. LOCATION: Where does it hurt?      Left side lower 2. RADIATION: Does the pain shoot anywhere else? (e.g., chest, back)     Into back sometimes 3. ONSET: When did the pain begin? (e.g., minutes, hours or days ago)      1 month ago 4. SUDDEN: Gradual or sudden onset?     Gradual 5. PATTERN Does the pain come and go, or is it constant?     Comes and goes 6. SEVERITY: How bad is the pain?  (e.g., Scale 1-10; mild, moderate, or severe)     Not bad - has diarrhea - has had 4 times diarrhea today 7. RECURRENT SYMPTOM: Have you ever had this type of stomach pain before? If Yes, ask: When was the last time? and What happened that time?      no 8. CAUSE: What do you think is causing the stomach pain? (e.g., gallstones, recent abdominal surgery)     no 9. RELIEVING/AGGRAVATING FACTORS: What makes it better or worse? (e.g., antacids, bending or twisting motion, bowel movement)     no 10. OTHER SYMPTOMS: Do you have  any other symptoms? (e.g., back pain, diarrhea, fever, urination pain, vomiting)       Diarrhea - stool was dark at first - now normal  Protocols used: Abdominal Pain - [facility]

## 2024-05-02 ENCOUNTER — Ambulatory Visit (INDEPENDENT_AMBULATORY_CARE_PROVIDER_SITE_OTHER): Admitting: Nurse Practitioner

## 2024-05-02 ENCOUNTER — Encounter: Payer: Self-pay | Admitting: Nurse Practitioner

## 2024-05-02 VITALS — BP 145/74 | HR 83 | Temp 96.8°F | Wt 163.0 lb

## 2024-05-02 DIAGNOSIS — I10 Essential (primary) hypertension: Secondary | ICD-10-CM

## 2024-05-02 DIAGNOSIS — Z13228 Encounter for screening for other metabolic disorders: Secondary | ICD-10-CM

## 2024-05-02 DIAGNOSIS — Z1329 Encounter for screening for other suspected endocrine disorder: Secondary | ICD-10-CM

## 2024-05-02 DIAGNOSIS — R197 Diarrhea, unspecified: Secondary | ICD-10-CM

## 2024-05-02 DIAGNOSIS — Z1231 Encounter for screening mammogram for malignant neoplasm of breast: Secondary | ICD-10-CM

## 2024-05-02 DIAGNOSIS — Z1211 Encounter for screening for malignant neoplasm of colon: Secondary | ICD-10-CM | POA: Diagnosis not present

## 2024-05-02 DIAGNOSIS — Z130 Encounter for screening for diseases of the blood and blood-forming organs and certain disorders involving the immune mechanism: Secondary | ICD-10-CM

## 2024-05-02 DIAGNOSIS — Z1321 Encounter for screening for nutritional disorder: Secondary | ICD-10-CM

## 2024-05-02 DIAGNOSIS — E785 Hyperlipidemia, unspecified: Secondary | ICD-10-CM

## 2024-05-02 DIAGNOSIS — M1711 Unilateral primary osteoarthritis, right knee: Secondary | ICD-10-CM

## 2024-05-02 DIAGNOSIS — M1712 Unilateral primary osteoarthritis, left knee: Secondary | ICD-10-CM

## 2024-05-02 DIAGNOSIS — R519 Headache, unspecified: Secondary | ICD-10-CM | POA: Insufficient documentation

## 2024-05-02 MED ORDER — DICLOFENAC SODIUM 1 % EX GEL: 4.0000 g | Freq: Four times a day (QID) | CUTANEOUS | 1 refills | Status: AC

## 2024-05-02 MED ORDER — AZITHROMYCIN 500 MG PO TABS: 500.0000 mg | ORAL_TABLET | Freq: Every day | ORAL | 0 refills | Status: AC

## 2024-05-02 MED ORDER — ATORVASTATIN CALCIUM 20 MG PO TABS: 20.0000 mg | ORAL_TABLET | Freq: Every day | ORAL | 0 refills | Status: DC

## 2024-05-02 MED ORDER — HYDROCHLOROTHIAZIDE 12.5 MG PO TABS: 12.5000 mg | ORAL_TABLET | Freq: Every day | ORAL | 1 refills | Status: DC

## 2024-05-02 NOTE — Progress Notes (Signed)
 Acute Office Visit  Subjective:     Patient ID: Hailey Young, female    DOB: 10/01/1951, 73 y.o.   MRN: [id number]  Chief Complaint  Patient presents with   Diarrhea    Two or three weeks    HPI Ms Hailey Young  has a past medical history of Essential hypertension (06/26/2020), Hyperlipidemia, Hyperlipidemia LDL goal <100 (05/02/2024), Hypertension, Perianal condylomata, Unilateral primary osteoarthritis, left knee (05/05/2017), and Unilateral primary osteoarthritis, right knee (02/02/2017).   Patient presents with complaints of diarrhea.  She was last seen at this office about 2 years ago  Diarrhea.  Patient complains of diarrhea that started about 3 weeks ago, diarrhea is worse at night had to diarrhea last night has not had any today.  She denied any recent use of antibiotics, states that she is eating okay and has been staying hydrated.  She denies fever, chills, chest pain, shortness of breath, abdominal pain, nausea, vomiting, hematochezia.   Hypertension.  Was on hydrochlorothiazide  25 mg daily but has been out of the medication for months.  She denies chest pain shortness of breath edema  Hyperlipidemia.  Was on atorvastatin  20 mg daily but has been out of the medication for months  Osteoarthritis.  Followed by orthopedics said that she is trying to avoid surgery takes tramadol  and Biofreeze as needed for pain, also gets steroid joint injections, uses knee brace.  Wondering if there is another medication she can be taken for her knee pain  Has a little pain on the left side of her head because she hit her head on the chair yesterday.      Review of Systems  Constitutional:  Negative for appetite change, chills, fatigue and fever.  HENT:  Negative for congestion, postnasal drip, rhinorrhea and sneezing.   Respiratory:  Negative for cough, shortness of breath and wheezing.   Cardiovascular:  Negative for chest pain, palpitations and leg swelling.  Gastrointestinal:   Positive for diarrhea. Negative for abdominal pain, constipation, nausea and vomiting.  Genitourinary:  Negative for difficulty urinating, dysuria, flank pain and frequency.  Musculoskeletal:  Positive for arthralgias. Negative for back pain, joint swelling and myalgias.  Skin:  Negative for color change, pallor, rash and wound.  Neurological:  Negative for dizziness, facial asymmetry, weakness, numbness and headaches.  Psychiatric/Behavioral:  Negative for behavioral problems, confusion, self-injury and suicidal ideas.         Objective:    BP (!) 145/74   Pulse 83   Temp (!) 96.8 F (36 C)   Wt 163 lb (73.9 kg)   SpO2 98%   BMI 25.53 kg/m    Physical Exam Vitals and nursing note reviewed.  Constitutional:      General: She is not in acute distress.    Appearance: Normal appearance. She is not ill-appearing, toxic-appearing or diaphoretic.  Eyes:     General: No scleral icterus.       Right eye: No discharge.        Left eye: No discharge.     Extraocular Movements: Extraocular movements intact.     Conjunctiva/sclera: Conjunctivae normal.  Cardiovascular:     Rate and Rhythm: Normal rate and regular rhythm.     Pulses: Normal pulses.     Heart sounds: Normal heart sounds. No murmur heard.    No friction rub. No gallop.  Pulmonary:     Effort: Pulmonary effort is normal. No respiratory distress.     Breath sounds: Normal breath sounds. No stridor. No  wheezing, rhonchi or rales.  Chest:     Chest wall: No tenderness.  Abdominal:     General: There is no distension.     Palpations: Abdomen is soft.     Tenderness: There is no abdominal tenderness. There is no right CVA tenderness, left CVA tenderness or guarding.  Musculoskeletal:        General: No swelling or signs of injury.     Right lower leg: No edema.     Left lower leg: No edema.     Comments: Knee braces in place bilaterally.  Skin:    General: Skin is warm and dry.     Coloration: Skin is not jaundiced  or pale.     Findings: No bruising, erythema or lesion.  Neurological:     Mental Status: She is alert and oriented to person, place, and time.     Motor: No weakness.     Coordination: Coordination normal.     Gait: Gait normal.  Psychiatric:        Mood and Affect: Mood normal.        Behavior: Behavior normal.        Thought Content: Thought content normal.        Judgment: Judgment normal.     No results found for any visits on 05/02/24.      Assessment & Plan:   Problem List Items Addressed This Visit       Cardiovascular and Mediastinum   Essential hypertension   Checking CMP, will refill medication if labs are normal DASH diet and commitment to daily physical activity for a minimum of 30 minutes discussed and encouraged, as a part of hypertension management.      05/02/2024    1:25 PM 05/02/2024    1:21 PM 10/28/2022    2:05 PM 06/11/2022   11:06 AM 06/11/2022   10:56 AM 04/22/2022    8:38 AM 01/26/2022    8:49 AM  BP/Weight  Systolic BP 145 147  121 130 133   Diastolic BP 74 76  89 92 91   Wt. (Lbs)  163 174  174.2 179.6 188  BMI  25.53 kg/m2 27.25 kg/m2  27.28 kg/m2 29.89 kg/m2 31.28 kg/m2           Relevant Medications   atorvastatin  (LIPITOR) 20 MG tablet   Other Relevant Orders   CMP14+EGFR     Musculoskeletal and Integument   Unilateral primary osteoarthritis, right knee   Continue tramadol  50 mg every 6 hours as needed, Biofreeze as needed Voltaren  gel 4 mg 4 times daily ordered, Continue knee brace and maintain close follow-up with orthopedics      Relevant Medications   diclofenac  Sodium (VOLTAREN  ARTHRITIS PAIN) 1 % GEL   Unilateral primary osteoarthritis, left knee   Continue tramadol  50 mg every 6 hours as needed, Biofreeze as needed Voltaren  gel 4 mg 4 times daily ordered, Continue knee brace and maintain close follow-up with orthopedics      Relevant Medications   diclofenac  Sodium (VOLTAREN  ARTHRITIS PAIN) 1 % GEL     Other    Hyperlipidemia LDL goal <100   Checking lipid panel Currently not on medication Lab Results  Component Value Date   CHOL 197 06/11/2022   HDL 63 06/11/2022   LDLCALC 108 (H) 06/11/2022   TRIG 150 (H) 06/11/2022   CHOLHDL 3.1 06/11/2022         Relevant Medications   atorvastatin  (LIPITOR) 20 MG tablet   Other  Relevant Orders   Lipid panel   Diarrhea - Primary   Importance of maintaining hydration and proper hand hygiene discussed - azithromycin  (ZITHROMAX ) 500 MG tablet; Take 1 tablet (500 mg total) by mouth daily for 3 days.  Dispense: 3 tablet; Refill: 0 - Cdiff NAA+O+P+Stool Culture       Relevant Medications   azithromycin  (ZITHROMAX ) 500 MG tablet   Other Relevant Orders   Cdiff NAA+O+P+Stool Culture   Pain in head   Advised to take Tylenol  as needed      Other Visit Diagnoses       Screening for colon cancer       Relevant Orders   Cologuard     Screening mammogram for breast cancer       Relevant Orders   MM 3D SCREENING MAMMOGRAM BILATERAL BREAST     Screening for endocrine, nutritional, metabolic and immunity disorder       Relevant Orders   CBC       Meds ordered this encounter  Medications   diclofenac  Sodium (VOLTAREN  ARTHRITIS PAIN) 1 % GEL    Sig: Apply 4 g topically 4 (four) times daily.    Dispense:  100 g    Refill:  1   DISCONTD: hydrochlorothiazide  (HYDRODIURIL ) 12.5 MG tablet    Sig: Take 1 tablet (12.5 mg total) by mouth daily.    Dispense:  60 tablet    Refill:  1   atorvastatin  (LIPITOR) 20 MG tablet    Sig: Take 1 tablet (20 mg total) by mouth daily.    Dispense:  90 tablet    Refill:  0    **Patient requests 90 days supply**   azithromycin  (ZITHROMAX ) 500 MG tablet    Sig: Take 1 tablet (500 mg total) by mouth daily for 3 days.    Dispense:  3 tablet    Refill:  0    Return in about 4 weeks (around 05/30/2024).  Michalina Calbert R Yazleemar Strassner, FNP

## 2024-05-02 NOTE — Assessment & Plan Note (Signed)
 Continue tramadol  50 mg every 6 hours as needed, Biofreeze as needed Voltaren  gel 4 mg 4 times daily ordered, Continue knee brace and maintain close follow-up with orthopedics

## 2024-05-02 NOTE — Patient Instructions (Addendum)
  Around 3 times per week, check your blood pressure 2 times per day. once in the morning and once in the evening. The readings should be at least one minute apart. Write down these values and bring them to your next nurse visit/appointment.  When you check your BP, make sure you have been doing something calm/relaxing 5 minutes prior to checking. Both feet should be flat on the floor and you should be sitting. Use your left arm and make sure it is in a relaxed position (on a table), and that the cuff is at the approximate level/height of your heart.       1. Diarrhea, unspecified type (Primary)  - Cdiff NAA+O+P+Stool Culture - azithromycin  (ZITHROMAX ) 500 MG tablet; Take 1 tablet (500 mg total) by mouth daily for 3 days.  Dispense: 3 tablet; Refill: 0  2. Screening for colon cancer  - Cologuard  3. Screening mammogram for breast cancer  - MM Digital Screening; Future  4. Essential hypertension  - CMP14+EGFR   5. Screening for endocrine, nutritional, metabolic and immunity disorder  - CBC  6. Unilateral primary osteoarthritis, right knee  - diclofenac  Sodium (VOLTAREN  ARTHRITIS PAIN) 1 % GEL; Apply 4 g topically 4 (four) times daily.  Dispense: 100 g; Refill: 1  7. Unilateral primary osteoarthritis, left knee  - diclofenac  Sodium (VOLTAREN  ARTHRITIS PAIN) 1 % GEL; Apply 4 g topically 4 (four) times daily.  Dispense: 100 g; Refill: 1  8. Hyperlipidemia LDL goal <100  - atorvastatin  (LIPITOR) 20 MG tablet; Take 1 tablet (20 mg total) by mouth daily.  Dispense: 90 tablet; Refill: 0      It is important that you exercise regularly at least 30 minutes 5 times a week as tolerated  Think about what you will eat, plan ahead. Choose  clean, green, fresh or frozen over canned, processed or packaged foods which are more sugary, salty and fatty. 70 to 75% of food eaten should be vegetables and fruit. Three meals at set times with snacks allowed between meals, but they must be  fruit or vegetables. Aim to eat over a 12 hour period , example 7 am to 7 pm, and STOP after  your last meal of the day. Drink water,generally about 64 ounces per day, no other drink is as healthy. Fruit juice is best enjoyed in a healthy way, by EATING the fruit.  Thanks for choosing [facility] we consider it a privelige to serve you.

## 2024-05-02 NOTE — Assessment & Plan Note (Signed)
 Checking CMP, will refill medication if labs are normal DASH diet and commitment to daily physical activity for a minimum of 30 minutes discussed and encouraged, as a part of hypertension management.      05/02/2024    1:25 PM 05/02/2024    1:21 PM 10/28/2022    2:05 PM 06/11/2022   11:06 AM 06/11/2022   10:56 AM 04/22/2022    8:38 AM 01/26/2022    8:49 AM  BP/Weight  Systolic BP 145 147  121 130 133   Diastolic BP 74 76  89 92 91   Wt. (Lbs)  163 174  174.2 179.6 188  BMI  25.53 kg/m2 27.25 kg/m2  27.28 kg/m2 29.89 kg/m2 31.28 kg/m2

## 2024-05-02 NOTE — Assessment & Plan Note (Signed)
 Checking lipid panel Currently not on medication Lab Results  Component Value Date   CHOL 197 06/11/2022   HDL 63 06/11/2022   LDLCALC 108 (H) 06/11/2022   TRIG 150 (H) 06/11/2022   CHOLHDL 3.1 06/11/2022

## 2024-05-02 NOTE — Assessment & Plan Note (Signed)
 Importance of maintaining hydration and proper hand hygiene discussed - azithromycin  (ZITHROMAX ) 500 MG tablet; Take 1 tablet (500 mg total) by mouth daily for 3 days.  Dispense: 3 tablet; Refill: 0 - Cdiff NAA+O+P+Stool Culture

## 2024-05-02 NOTE — Assessment & Plan Note (Signed)
Advised to take Tylenol as needed.

## 2024-05-03 LAB — CBC
Hematocrit: 35.8 % (ref 34.0–46.6)
Hemoglobin: 11.8 g/dL (ref 11.1–15.9)
MCH: 33.4 pg — ABNORMAL HIGH (ref 26.6–33.0)
MCHC: 33 g/dL (ref 31.5–35.7)
MCV: 101 fL — ABNORMAL HIGH (ref 79–97)
Platelets: 204 x10E3/uL (ref 150–450)
RBC: 3.53 x10E6/uL — ABNORMAL LOW (ref 3.77–5.28)
RDW: 14.4 % (ref 11.7–15.4)
WBC: 2.9 x10E3/uL — ABNORMAL LOW (ref 3.4–10.8)

## 2024-05-03 LAB — CMP14+EGFR
ALT: 22 IU/L (ref 0–32)
AST: 26 IU/L (ref 0–40)
Albumin: 4 g/dL (ref 3.8–4.8)
Alkaline Phosphatase: 79 IU/L (ref 44–121)
BUN/Creatinine Ratio: 15 (ref 12–28)
BUN: 13 mg/dL (ref 8–27)
Bilirubin Total: 0.3 mg/dL (ref 0.0–1.2)
CO2: 24 mmol/L (ref 20–29)
Calcium: 10.6 mg/dL — ABNORMAL HIGH (ref 8.7–10.3)
Chloride: 107 mmol/L — ABNORMAL HIGH (ref 96–106)
Creatinine, Ser: 0.87 mg/dL (ref 0.57–1.00)
Globulin, Total: 2.5 g/dL (ref 1.5–4.5)
Glucose: 91 mg/dL (ref 70–99)
Potassium: 3 mmol/L — ABNORMAL LOW (ref 3.5–5.2)
Sodium: 146 mmol/L — ABNORMAL HIGH (ref 134–144)
Total Protein: 6.5 g/dL (ref 6.0–8.5)
eGFR: 71 mL/min/1.73 (ref >59)

## 2024-05-03 LAB — LIPID PANEL
Chol/HDL Ratio: 4.2 ratio (ref 0.0–4.4)
Cholesterol, Total: 241 mg/dL — ABNORMAL HIGH (ref 100–199)
HDL: 58 mg/dL (ref >39)
LDL Chol Calc (NIH): 144 mg/dL — ABNORMAL HIGH (ref 0–99)
Triglycerides: 220 mg/dL — ABNORMAL HIGH (ref 0–149)
VLDL Cholesterol Cal: 39 mg/dL (ref 5–40)

## 2024-05-04 ENCOUNTER — Ambulatory Visit: Payer: Self-pay | Admitting: Nurse Practitioner

## 2024-05-04 ENCOUNTER — Other Ambulatory Visit: Payer: Self-pay | Admitting: Nurse Practitioner

## 2024-05-04 DIAGNOSIS — E876 Hypokalemia: Secondary | ICD-10-CM

## 2024-05-04 DIAGNOSIS — E785 Hyperlipidemia, unspecified: Secondary | ICD-10-CM

## 2024-05-04 DIAGNOSIS — I10 Essential (primary) hypertension: Secondary | ICD-10-CM

## 2024-05-04 MED ORDER — ATORVASTATIN CALCIUM 20 MG PO TABS
20.0000 mg | ORAL_TABLET | Freq: Every day | ORAL | 1 refills | Status: DC
Start: 2024-05-04 — End: 2024-07-30

## 2024-05-04 MED ORDER — VALSARTAN 80 MG PO TABS: 80.0000 mg | ORAL_TABLET | Freq: Every day | ORAL | 1 refills | Status: DC

## 2024-05-04 MED ORDER — POTASSIUM CHLORIDE CRYS ER 20 MEQ PO TBCR: 40.0000 meq | EXTENDED_RELEASE_TABLET | Freq: Every day | ORAL | 0 refills | Status: DC

## 2024-05-08 ENCOUNTER — Telehealth: Payer: Self-pay | Admitting: Physician Assistant

## 2024-05-08 NOTE — Telephone Encounter (Signed)
 Pt called requesting refill of tramadol . Please send [employer] on [address]. Pt phone number is [phone].

## 2024-05-09 ENCOUNTER — Other Ambulatory Visit: Payer: Self-pay | Admitting: Physician Assistant

## 2024-05-09 MED ORDER — TRAMADOL HCL 50 MG PO TABS: 50.0000 mg | ORAL_TABLET | Freq: Four times a day (QID) | ORAL | 0 refills | Status: DC | PRN

## 2024-05-18 ENCOUNTER — Other Ambulatory Visit: Payer: Self-pay | Admitting: Nurse Practitioner

## 2024-05-18 DIAGNOSIS — I10 Essential (primary) hypertension: Secondary | ICD-10-CM

## 2024-05-18 MED ORDER — VALSARTAN 80 MG PO TABS: 80.0000 mg | ORAL_TABLET | Freq: Every day | ORAL | 1 refills | Status: DC

## 2024-05-18 NOTE — Telephone Encounter (Signed)
 Please advise because of recent D/C. KH

## 2024-06-06 ENCOUNTER — Ambulatory Visit (INDEPENDENT_AMBULATORY_CARE_PROVIDER_SITE_OTHER): Payer: Self-pay | Admitting: Nurse Practitioner

## 2024-06-06 ENCOUNTER — Encounter: Payer: Self-pay | Admitting: Nurse Practitioner

## 2024-06-06 VITALS — BP 129/72 | HR 76 | Wt 155.0 lb

## 2024-06-06 DIAGNOSIS — D509 Iron deficiency anemia, unspecified: Secondary | ICD-10-CM

## 2024-06-06 DIAGNOSIS — Z1321 Encounter for screening for nutritional disorder: Secondary | ICD-10-CM

## 2024-06-06 DIAGNOSIS — E785 Hyperlipidemia, unspecified: Secondary | ICD-10-CM | POA: Diagnosis not present

## 2024-06-06 DIAGNOSIS — M1712 Unilateral primary osteoarthritis, left knee: Secondary | ICD-10-CM

## 2024-06-06 DIAGNOSIS — I10 Essential (primary) hypertension: Secondary | ICD-10-CM | POA: Diagnosis not present

## 2024-06-06 DIAGNOSIS — Z780 Asymptomatic menopausal state: Secondary | ICD-10-CM | POA: Diagnosis not present

## 2024-06-06 DIAGNOSIS — E876 Hypokalemia: Secondary | ICD-10-CM

## 2024-06-06 DIAGNOSIS — M1711 Unilateral primary osteoarthritis, right knee: Secondary | ICD-10-CM

## 2024-06-06 NOTE — Assessment & Plan Note (Signed)
 Lab Results  Component Value Date   WBC 2.9 (L) 05/02/2024   HGB 11.8 05/02/2024   HCT 35.8 05/02/2024   MCV 101 (H) 05/02/2024   PLT 204 05/02/2024    Iron deficiency on ferrous sulfate  325 mg daily Blood counts stable. - Continue daily iron supplementation.

## 2024-06-06 NOTE — Assessment & Plan Note (Signed)
 Lab Results  Component Value Date   NA 146 (H) 05/02/2024   K 3.0 (L) 05/02/2024   CO2 24 05/02/2024   GLUCOSE 91 05/02/2024   BUN 13 05/02/2024   CREATININE 0.87 05/02/2024   CALCIUM  10.6 (H) 05/02/2024   EGFR 71 05/02/2024   GFRNONAA >60 01/07/2020  Potassium was replaced Rechecking labs

## 2024-06-06 NOTE — Assessment & Plan Note (Addendum)
 Lab Results  Component Value Date   CHOL 241 (H) 05/02/2024   HDL 58 05/02/2024   LDLCALC 144 (H) 05/02/2024   TRIG 220 (H) 05/02/2024   CHOLHDL 4.2 05/02/2024    Cholesterol levels elevated; LDL 144 mg/dL, triglycerides 779 mg/dL. Discussed medication adherence and lifestyle changes. - Continue atorvastatin  20 mg daily - Encourage heart-healthy diet.

## 2024-06-06 NOTE — Assessment & Plan Note (Addendum)
 BP Readings from Last 3 Encounters:  06/06/24 129/72  05/02/24 (!) 145/74  06/11/22 121/89    Blood pressure well-controlled with current medication. - Continue valsartan  80mg  daily. - Advise low-salt, low-fat diet. - Encourage exercise as tolerated.

## 2024-06-06 NOTE — Assessment & Plan Note (Signed)
  Chronic bilateral knee osteoarthritis with swelling.  Continue tramadol  50 mg every 6 hours as needed, OTC pain patch  - Try Voltaren  gel for pain relief.. - Encourage exercise as tolerated.

## 2024-06-06 NOTE — Assessment & Plan Note (Signed)
 Avoid calcium  supplement Maintain hydration Rechecking labs Lab Results  Component Value Date   NA 146 (H) 05/02/2024   K 3.0 (L) 05/02/2024   CO2 24 05/02/2024   GLUCOSE 91 05/02/2024   BUN 13 05/02/2024   CREATININE 0.87 05/02/2024   CALCIUM  10.6 (H) 05/02/2024   EGFR 71 05/02/2024   GFRNONAA >60 01/07/2020

## 2024-06-06 NOTE — Progress Notes (Signed)
 Established Patient Office Visit  Subjective:  Patient ID: Hailey Young, female    DOB: [birth year]  Age: 73 y.o. MRN: [id number]  CC:  Chief Complaint  Patient presents with   Diarrhea    HPI   Discussed the use of AI scribe software for clinical note transcription with the patient, who gave verbal consent to proceed.  History of Present Illness Hailey Young is a 73 year old female  has a past medical history of Essential hypertension (06/26/2020), Hyperlipidemia, Hyperlipidemia LDL goal <100 (05/02/2024), Hypertension, Perianal condylomata, Unilateral primary osteoarthritis, left knee (05/05/2017), and Unilateral primary osteoarthritis, right knee (02/02/2017).   who presents for a follow-up visit. She is accompanied by her daughter   Her diarrhea has resolved since the last visit, and she feels better overall. No fever, chills, chest pain, or shortness of breath.   For arthritis, she uses patches for knee pain relief and plans to try Voltaren  gel. She takes tramadol  as needed for knee pain. She experiences swelling in her knees but manages to stay active by walking around the house.  She takes ferrous sulfate  daily for iron supplementation,   Her cholesterol levels were previously high, with LDL at 144 mg/dL and triglycerides at 779 mg/dL.  Taking atorvastatin  20 mg daily  She is not currently taking vitamin D or calcium  supplements. Her previous labs showed a slightly high calcium  level and low potassium, for which she completed a course of potassium supplements. She mostly drinks water and Gatorade.  She recently completed a Cologuard test will mail today. She has not yet completed her mammogram.  Encouraged to get mammogram completed.  DEXA scan ordered to screen for osteoporosis.  Need for pneumococcal vaccine and shingles vaccine discussed advised to get the vaccines at the pharmacy       Past Medical History:  Diagnosis Date   Essential hypertension  06/26/2020   Hyperlipidemia    Hyperlipidemia LDL goal <100 05/02/2024   Hypertension    Perianal condylomata    Unilateral primary osteoarthritis, left knee 05/05/2017   Unilateral primary osteoarthritis, right knee 02/02/2017    Past Surgical History:  Procedure Laterality Date   ablation of perianal warts      History reviewed. No pertinent family history.  Social History   Socioeconomic History   Marital status: Single    Spouse name: Not on file   Number of children: Not on file   Years of education: Not on file   Highest education level: Not on file  Occupational History   Not on file  Tobacco Use   Smoking status: Never   Smokeless tobacco: Never  Vaping Use   Vaping status: Never Used  Substance and Sexual Activity   Alcohol  use: No   Drug use: No   Sexual activity: Not Currently  Other Topics Concern   Not on file  Social History Narrative   Lives with son, Never married, 12 th grade education   Social Drivers of Health   Financial Resource Strain: Low Risk  (10/28/2022)   Overall Financial Resource Strain (CARDIA)    Difficulty of Paying Living Expenses: Not hard at all  Food Insecurity: No Food Insecurity (10/28/2022)   Hunger Vital Sign    Worried About Running Out of Food in the Last Year: Never true    Ran Out of Food in the Last Year: Never true  Transportation Needs: No Transportation Needs (10/28/2022)   PRAPARE - Administrator, Civil Service (Medical):  No    Lack of Transportation (Non-Medical): No  Physical Activity: Inactive (10/28/2022)   Exercise Vital Sign    Days of Exercise per Week: 0 days    Minutes of Exercise per Session: 0 min  Stress: No Stress Concern Present (10/28/2022)   [employer] of Occupational Health - Occupational Stress Questionnaire    Feeling of Stress : Not at all  Social Connections: Moderately Isolated (10/28/2022)   Social Connection and Isolation Panel    Frequency of Communication with Friends  and Family: Once a week    Frequency of Social Gatherings with Friends and Family: Three times a week    Attends Religious Services: 1 to 4 times per year    Active Member of Clubs or Organizations: No    Attends Banker Meetings: Never    Marital Status: Never married  Intimate Partner Violence: Not At Risk (10/28/2022)   Humiliation, Afraid, Rape, and Kick questionnaire    Fear of Current or Ex-Partner: No    Emotionally Abused: No    Physically Abused: No    Sexually Abused: No    Outpatient Medications Prior to Visit  Medication Sig Dispense Refill   aspirin  EC 81 MG tablet Take 1 tablet (81 mg total) by mouth daily. 30 tablet 11   atorvastatin  (LIPITOR) 20 MG tablet Take 1 tablet (20 mg total) by mouth daily. 90 tablet 1   ferrous sulfate  325 (65 FE) MG tablet Take 1 tablet (325 mg total) by mouth 2 (two) times daily with a meal. (Patient taking differently: Take 1 tablet (325 mg total) by mouth 2 (two) times daily with a meal.) 60 tablet 3   traMADol  (ULTRAM ) 50 MG tablet Take 1 tablet (50 mg total) by mouth every 6 (six) hours as needed. 30 tablet 0   valsartan  (DIOVAN ) 80 MG tablet Take 1 tablet (80 mg total) by mouth daily. 60 tablet 1   Cholecalciferol 50 MCG (2000 UT) TABS Take by mouth. (Patient not taking: Reported on 06/06/2024)     diclofenac  Sodium (VOLTAREN  ARTHRITIS PAIN) 1 % GEL Apply 4 g topically 4 (four) times daily. (Patient not taking: Reported on 06/06/2024) 100 g 1   Menthol , Topical Analgesic, (BIOFREEZE) 4 % GEL Apply 1 each topically every 6 (six) hours as needed. 1 Tube 0   potassium chloride  SA (KLOR-CON  M) 20 MEQ tablet Take 2 tablets (40 mEq total) by mouth daily for 2 days. (Patient not taking: Reported on 06/06/2024) 4 tablet 0   No facility-administered medications prior to visit.    No Known Allergies  ROS Review of Systems  Constitutional:  Negative for appetite change, chills, fatigue and fever.  HENT:  Negative for congestion,  postnasal drip, rhinorrhea and sneezing.   Respiratory:  Negative for cough, shortness of breath and wheezing.   Cardiovascular:  Negative for chest pain, palpitations and leg swelling.  Gastrointestinal:  Negative for abdominal pain, constipation, nausea and vomiting.  Genitourinary:  Negative for difficulty urinating, dysuria, flank pain and frequency.  Musculoskeletal:  Positive for arthralgias. Negative for joint swelling and myalgias.  Skin:  Negative for color change, pallor, rash and wound.  Neurological:  Negative for dizziness, facial asymmetry, weakness, numbness and headaches.  Psychiatric/Behavioral:  Negative for behavioral problems, confusion, self-injury and suicidal ideas.       Objective:    Physical Exam Vitals and nursing note reviewed.  Constitutional:      General: She is not in acute distress.    Appearance: Normal  appearance. She is not ill-appearing, toxic-appearing or diaphoretic.  Eyes:     General: No scleral icterus.       Right eye: No discharge.        Left eye: No discharge.     Extraocular Movements: Extraocular movements intact.     Conjunctiva/sclera: Conjunctivae normal.  Cardiovascular:     Rate and Rhythm: Normal rate and regular rhythm.     Pulses: Normal pulses.     Heart sounds: Normal heart sounds. No murmur heard.    No friction rub. No gallop.  Pulmonary:     Effort: Pulmonary effort is normal. No respiratory distress.     Breath sounds: Normal breath sounds. No stridor. No wheezing, rhonchi or rales.  Chest:     Chest wall: No tenderness.  Abdominal:     General: There is no distension.     Palpations: Abdomen is soft.     Tenderness: There is no abdominal tenderness. There is no right CVA tenderness, left CVA tenderness or guarding.  Musculoskeletal:        General: No swelling or tenderness.     Right lower leg: No edema.     Left lower leg: No edema.  Skin:    General: Skin is warm and dry.     Capillary Refill: Capillary  refill takes less than 2 seconds.     Coloration: Skin is not jaundiced or pale.     Findings: No bruising, erythema or lesion.  Neurological:     Mental Status: She is alert and oriented to person, place, and time.     Motor: No weakness.  Psychiatric:        Mood and Affect: Mood normal.        Behavior: Behavior normal.        Thought Content: Thought content normal.        Judgment: Judgment normal.     BP 129/72   Pulse 76   Wt 155 lb (70.3 kg)   SpO2 99%   BMI 24.28 kg/m  Wt Readings from Last 3 Encounters:  06/06/24 155 lb (70.3 kg)  05/02/24 163 lb (73.9 kg)  10/28/22 174 lb (78.9 kg)    Lab Results  Component Value Date   TSH 0.424 (L) 07/24/2021   Lab Results  Component Value Date   WBC 2.9 (L) 05/02/2024   HGB 11.8 05/02/2024   HCT 35.8 05/02/2024   MCV 101 (H) 05/02/2024   PLT 204 05/02/2024   Lab Results  Component Value Date   NA 146 (H) 05/02/2024   K 3.0 (L) 05/02/2024   CO2 24 05/02/2024   GLUCOSE 91 05/02/2024   BUN 13 05/02/2024   CREATININE 0.87 05/02/2024   BILITOT 0.3 05/02/2024   ALKPHOS 79 05/02/2024   AST 26 05/02/2024   ALT 22 05/02/2024   PROT 6.5 05/02/2024   ALBUMIN 4.0 05/02/2024   CALCIUM  10.6 (H) 05/02/2024   ANIONGAP 8 01/07/2020   EGFR 71 05/02/2024   Lab Results  Component Value Date   CHOL 241 (H) 05/02/2024   Lab Results  Component Value Date   HDL 58 05/02/2024   Lab Results  Component Value Date   LDLCALC 144 (H) 05/02/2024   Lab Results  Component Value Date   TRIG 220 (H) 05/02/2024   Lab Results  Component Value Date   CHOLHDL 4.2 05/02/2024   Lab Results  Component Value Date   HGBA1C 5.2 11/23/2016      Assessment &  Plan:  Assessment and Plan Assessment & Plan     Problem List Items Addressed This Visit       Cardiovascular and Mediastinum   Essential hypertension - Primary   BP Readings from Last 3 Encounters:  06/06/24 129/72  05/02/24 (!) 145/74  06/11/22 121/89    Blood  pressure well-controlled with current medication. - Continue valsartan  80mg  daily. - Advise low-salt, low-fat diet. - Encourage exercise as tolerated.      Relevant Orders   Basic Metabolic Panel     Musculoskeletal and Integument   Unilateral primary osteoarthritis, right knee    Chronic bilateral knee osteoarthritis with swelling.  Continue tramadol  50 mg every 6 hours as needed, OTC pain patch  - Try Voltaren  gel for pain relief.. - Encourage exercise as tolerated.      Unilateral primary osteoarthritis, left knee    Chronic bilateral knee osteoarthritis with swelling.  Continue tramadol  50 mg every 6 hours as needed, OTC pain patch  - Try Voltaren  gel for pain relief.. - Encourage exercise as tolerated.        Other   Hypokalemia   Lab Results  Component Value Date   NA 146 (H) 05/02/2024   K 3.0 (L) 05/02/2024   CO2 24 05/02/2024   GLUCOSE 91 05/02/2024   BUN 13 05/02/2024   CREATININE 0.87 05/02/2024   CALCIUM  10.6 (H) 05/02/2024   EGFR 71 05/02/2024   GFRNONAA >60 01/07/2020  Potassium was replaced Rechecking labs      Relevant Orders   Basic Metabolic Panel   Anemia, iron deficiency   Lab Results  Component Value Date   WBC 2.9 (L) 05/02/2024   HGB 11.8 05/02/2024   HCT 35.8 05/02/2024   MCV 101 (H) 05/02/2024   PLT 204 05/02/2024    Iron deficiency on ferrous sulfate  325 mg daily Blood counts stable. - Continue daily iron supplementation.      Hyperlipidemia LDL goal <100   Lab Results  Component Value Date   CHOL 241 (H) 05/02/2024   HDL 58 05/02/2024   LDLCALC 144 (H) 05/02/2024   TRIG 220 (H) 05/02/2024   CHOLHDL 4.2 05/02/2024    Cholesterol levels elevated; LDL 144 mg/dL, triglycerides 779 mg/dL. Discussed medication adherence and lifestyle changes. - Continue atorvastatin  20 mg daily - Encourage heart-healthy diet.      Relevant Orders   Lipid panel   Hypercalcemia   Avoid calcium  supplement Maintain hydration Rechecking  labs Lab Results  Component Value Date   NA 146 (H) 05/02/2024   K 3.0 (L) 05/02/2024   CO2 24 05/02/2024   GLUCOSE 91 05/02/2024   BUN 13 05/02/2024   CREATININE 0.87 05/02/2024   CALCIUM  10.6 (H) 05/02/2024   EGFR 71 05/02/2024   GFRNONAA >60 01/07/2020         Relevant Orders   Basic Metabolic Panel   Other Visit Diagnoses       Postmenopausal       Relevant Orders   DG Bone Density     Encounter for vitamin deficiency screening       Relevant Orders   VITAMIN D 25 Hydroxy (Vit-D Deficiency, Fractures)       No orders of the defined types were placed in this encounter.   Follow-up: Return in about 4 months (around 10/06/2024) for HTN, HYPERLIPIDEMIA.    Bradie Sangiovanni R Susana Gripp, FNP

## 2024-06-06 NOTE — Patient Instructions (Addendum)
 Please call [phone]   to schedule your mammogram.  [facility]. [address] [place], [place] [address]. [place] .    Please consider getting Shingrix and pneumococcal  vaccine at local pharmacy.   1. Postmenopausal (Primary)  - DG Bone Density; Future  2. Encounter for vitamin deficiency screening  - VITAMIN D 25 Hydroxy (Vit-D Deficiency, Fractures)  3. Essential hypertension  - Basic Metabolic Panel  4. Hyperlipidemia LDL goal <100  - Lipid panel  5. Hypokalemia  - Basic Metabolic Panel  6. Hypercalcemia  - Basic Metabolic Panel   It is important that you exercise regularly at least 30 minutes 5 times a week as tolerated  Think about what you will eat, plan ahead. Choose  clean, green, fresh or frozen over canned, processed or packaged foods which are more sugary, salty and fatty. 70 to 75% of food eaten should be vegetables and fruit. Three meals at set times with snacks allowed between meals, but they must be fruit or vegetables. Aim to eat over a 12 hour period , example 7 am to 7 pm, and STOP after  your last meal of the day. Drink water,generally about 64 ounces per day, no other drink is as healthy. Fruit juice is best enjoyed in a healthy way, by EATING the fruit.  Thanks for choosing [facility] we consider it a privelige to serve you.

## 2024-06-11 LAB — COLOGUARD: COLOGUARD: NEGATIVE

## 2024-07-05 ENCOUNTER — Encounter: Payer: Self-pay | Admitting: Physician Assistant

## 2024-07-05 ENCOUNTER — Ambulatory Visit: Admitting: Physician Assistant

## 2024-07-05 DIAGNOSIS — M1712 Unilateral primary osteoarthritis, left knee: Secondary | ICD-10-CM

## 2024-07-05 DIAGNOSIS — M170 Bilateral primary osteoarthritis of knee: Secondary | ICD-10-CM | POA: Diagnosis not present

## 2024-07-05 DIAGNOSIS — M1711 Unilateral primary osteoarthritis, right knee: Secondary | ICD-10-CM

## 2024-07-05 MED ORDER — METHYLPREDNISOLONE ACETATE 40 MG/ML IJ SUSP
40.0000 mg | INTRAMUSCULAR | Status: AC | PRN
  Administered 2024-07-05: 40 mg via INTRA_ARTICULAR

## 2024-07-05 MED ORDER — LIDOCAINE HCL 1 % IJ SOLN
4.0000 mL | INTRAMUSCULAR | Status: AC | PRN
  Administered 2024-07-05: 4 mL

## 2024-07-05 MED ORDER — TRAMADOL HCL 50 MG PO TABS: 50.0000 mg | ORAL_TABLET | Freq: Four times a day (QID) | ORAL | 0 refills | Status: DC | PRN

## 2024-07-05 NOTE — Addendum Note (Signed)
 Addended by: GRETTA FORTE on: 07/05/2024 04:58 PM   Modules accepted: Orders

## 2024-07-05 NOTE — Progress Notes (Signed)
   Procedure Note  Patient: Hailey Young             Date of Birth: 1950-10-31           MRN: [id number]             Visit Date: 07/05/2024   HPI: Hailey Young comes in today for cortisone injections both knees.  She has known arthritis of both knees.  She has had no known injury to either knee.  No fevers chills.  Physical exam: Bilateral knees no abnormal warmth erythema.  Bilateral knees positive effusion.  Good range of motion both knees.  Procedures: Visit Diagnoses:  1. Unilateral primary osteoarthritis, left knee   2. Unilateral primary osteoarthritis, right knee     Large Joint Inj: bilateral knee on 07/05/2024 4:19 PM Indications: pain Details: 22 G 1.5 in needle, anterolateral approach  Arthrogram: No  Medications (Right): 4 mL lidocaine  1 %; 40 mg methylPREDNISolone  acetate 40 MG/ML Aspirate (Right): 43 mL yellow and blood-tinged Medications (Left): 4 mL lidocaine  1 %; 40 mg methylPREDNISolone  acetate 40 MG/ML Aspirate (Left): 48 mL yellow and blood-tinged Outcome: tolerated well, no immediate complications Procedure, treatment alternatives, risks and benefits explained, specific risks discussed. Consent was given by the patient. Immediately prior to procedure a time out was called to verify the correct patient, procedure, equipment, support staff and site/side marked as required. Patient was prepped and draped in the usual sterile fashion.    Plan: She will follow-up with us  as needed needs to wait least 3 months between injections.  Knees were wrapped with Ace bandages which she will remove this evening.

## 2024-07-26 ENCOUNTER — Other Ambulatory Visit: Payer: Self-pay | Admitting: Nurse Practitioner

## 2024-07-26 DIAGNOSIS — I10 Essential (primary) hypertension: Secondary | ICD-10-CM

## 2024-07-29 ENCOUNTER — Other Ambulatory Visit: Payer: Self-pay | Admitting: Nurse Practitioner

## 2024-07-29 DIAGNOSIS — E785 Hyperlipidemia, unspecified: Secondary | ICD-10-CM

## 2024-08-07 ENCOUNTER — Ambulatory Visit: Payer: Self-pay

## 2024-08-30 ENCOUNTER — Other Ambulatory Visit: Payer: Self-pay | Admitting: Nurse Practitioner

## 2024-08-30 DIAGNOSIS — I10 Essential (primary) hypertension: Secondary | ICD-10-CM

## 2024-08-31 ENCOUNTER — Other Ambulatory Visit: Payer: Self-pay | Admitting: Nurse Practitioner

## 2024-08-31 DIAGNOSIS — E785 Hyperlipidemia, unspecified: Secondary | ICD-10-CM

## 2024-08-31 DIAGNOSIS — Z1321 Encounter for screening for nutritional disorder: Secondary | ICD-10-CM

## 2024-08-31 DIAGNOSIS — I10 Essential (primary) hypertension: Secondary | ICD-10-CM

## 2024-08-31 MED ORDER — VALSARTAN 80 MG PO TABS: 80.0000 mg | ORAL_TABLET | Freq: Every day | ORAL | 1 refills | Status: AC

## 2024-08-31 MED ORDER — VALSARTAN 80 MG PO TABS
80.0000 mg | ORAL_TABLET | Freq: Every day | ORAL | 1 refills | Status: DC
Start: 2024-08-31 — End: 2024-08-31

## 2024-09-04 ENCOUNTER — Telehealth: Payer: Self-pay

## 2024-09-04 NOTE — Telephone Encounter (Signed)
 Pt was made an appt for labs. Pt was also advised to stop hydrochlorothiazide  and take valsartan . [facility]

## 2024-09-04 NOTE — Telephone Encounter (Signed)
-----   Message from Folashade R Paseda sent at 08/31/2024  9:38 AM EST ----- I had previously   discontinued hydrochlorothiazide  and started patient on valsartan .  Please call the pharmacy to notify them of this change.  Please schedule an appointment for  fasting labs, labs were ordered at her last visit but she has not done the labs.  Thank you

## 2024-09-07 ENCOUNTER — Other Ambulatory Visit

## 2024-09-07 DIAGNOSIS — I10 Essential (primary) hypertension: Secondary | ICD-10-CM

## 2024-09-07 DIAGNOSIS — E785 Hyperlipidemia, unspecified: Secondary | ICD-10-CM

## 2024-09-07 DIAGNOSIS — Z1321 Encounter for screening for nutritional disorder: Secondary | ICD-10-CM

## 2024-09-08 LAB — LIPID PANEL
Chol/HDL Ratio: 2.4 ratio (ref 0.0–4.4)
Cholesterol, Total: 169 mg/dL (ref 100–199)
HDL: 69 mg/dL (ref >39)
LDL Chol Calc (NIH): 79 mg/dL (ref 0–99)
Triglycerides: 121 mg/dL (ref 0–149)
VLDL Cholesterol Cal: 21 mg/dL (ref 5–40)

## 2024-09-08 LAB — BASIC METABOLIC PANEL WITH GFR
BUN/Creatinine Ratio: 15 (ref 12–28)
BUN: 16 mg/dL (ref 8–27)
CO2: 23 mmol/L (ref 20–29)
Calcium: 10.8 mg/dL — ABNORMAL HIGH (ref 8.7–10.3)
Chloride: 105 mmol/L (ref 96–106)
Creatinine, Ser: 1.06 mg/dL — ABNORMAL HIGH (ref 0.57–1.00)
Glucose: 86 mg/dL (ref 70–99)
Potassium: 4.1 mmol/L (ref 3.5–5.2)
Sodium: 143 mmol/L (ref 134–144)
eGFR: 56 mL/min/1.73 — ABNORMAL LOW (ref >59)

## 2024-09-08 LAB — VITAMIN D 25 HYDROXY (VIT D DEFICIENCY, FRACTURES): Vit D, 25-Hydroxy: 54 ng/mL (ref 30.0–100.0)

## 2024-09-10 ENCOUNTER — Ambulatory Visit: Payer: Self-pay | Admitting: Nurse Practitioner

## 2024-09-10 ENCOUNTER — Other Ambulatory Visit: Payer: Self-pay | Admitting: Nurse Practitioner

## 2024-09-10 DIAGNOSIS — E785 Hyperlipidemia, unspecified: Secondary | ICD-10-CM

## 2024-09-10 MED ORDER — ATORVASTATIN CALCIUM 20 MG PO TABS: 20.0000 mg | ORAL_TABLET | Freq: Every day | ORAL | 3 refills | Status: AC

## 2024-10-10 ENCOUNTER — Ambulatory Visit: Admitting: Physician Assistant

## 2024-10-10 ENCOUNTER — Ambulatory Visit (INDEPENDENT_AMBULATORY_CARE_PROVIDER_SITE_OTHER): Payer: Self-pay | Admitting: Nurse Practitioner

## 2024-10-10 ENCOUNTER — Telehealth: Payer: Self-pay | Admitting: Physician Assistant

## 2024-10-10 ENCOUNTER — Encounter: Payer: Self-pay | Admitting: Nurse Practitioner

## 2024-10-10 VITALS — BP 133/74 | HR 77 | Wt 156.4 lb

## 2024-10-10 DIAGNOSIS — D509 Iron deficiency anemia, unspecified: Secondary | ICD-10-CM | POA: Diagnosis not present

## 2024-10-10 DIAGNOSIS — M1712 Unilateral primary osteoarthritis, left knee: Secondary | ICD-10-CM

## 2024-10-10 DIAGNOSIS — M170 Bilateral primary osteoarthritis of knee: Secondary | ICD-10-CM

## 2024-10-10 DIAGNOSIS — I10 Essential (primary) hypertension: Secondary | ICD-10-CM | POA: Diagnosis not present

## 2024-10-10 DIAGNOSIS — M1711 Unilateral primary osteoarthritis, right knee: Secondary | ICD-10-CM

## 2024-10-10 DIAGNOSIS — E559 Vitamin D deficiency, unspecified: Secondary | ICD-10-CM | POA: Insufficient documentation

## 2024-10-10 DIAGNOSIS — Z0000 Encounter for general adult medical examination without abnormal findings: Secondary | ICD-10-CM | POA: Insufficient documentation

## 2024-10-10 MED ORDER — FERROUS SULFATE 325 (65 FE) MG PO TABS: 325.0000 mg | ORAL_TABLET | Freq: Every day | ORAL | 3 refills | Status: AC

## 2024-10-10 MED ORDER — METHYLPREDNISOLONE ACETATE 40 MG/ML IJ SUSP
40.0000 mg | INTRAMUSCULAR | Status: AC | PRN
  Administered 2024-10-10: 40 mg via INTRA_ARTICULAR

## 2024-10-10 MED ORDER — LIDOCAINE HCL 1 % IJ SOLN
4.0000 mL | INTRAMUSCULAR | Status: AC | PRN
  Administered 2024-10-10: 4 mL

## 2024-10-10 NOTE — Assessment & Plan Note (Signed)
 General health maintenance Mammogram not completed. Declined referral for mammogram. - Encouraged hydration with at least 64 ounces of water daily. - Advised to avoid NSAIDs like Aleve and ibuprofen  to prevent kidney problems.

## 2024-10-10 NOTE — Progress Notes (Signed)
" ° °  Procedure Note  Patient: Hailey Young             Date of Birth: 10/31/1950           MRN: [id number]             Visit Date: 10/10/2024  HPI: Mrs. Hailey Young comes in today requesting cortisone injections for both knees.  She also wants the knees aspirated.  Last office visit was 07/05/2024 in which she underwent injection aspiration with both knees.  She has had no new injury to either knee.  Denies any fevers chills.  Physical exam: Bilateral knees no abnormal warmth erythema.  Good range of motion of both knees.  Positive effusion bilaterally.   Procedures: Visit Diagnoses:  1. Unilateral primary osteoarthritis, left knee   2. Unilateral primary osteoarthritis, right knee     Large Joint Inj: bilateral knee on 10/10/2024 11:47 AM Indications: pain Details: 22 G 1.5 in needle, superolateral approach  Arthrogram: No  Medications (Right): 4 mL lidocaine  1 %; 40 mg methylPREDNISolone  acetate 40 MG/ML Aspirate (Right): 43 mL yellow Medications (Left): 4 mL lidocaine  1 %; 40 mg methylPREDNISolone  acetate 40 MG/ML Aspirate (Left): 45 mL yellow Outcome: tolerated well, no immediate complications Procedure, treatment alternatives, risks and benefits explained, specific risks discussed. Consent was given by the patient. Immediately prior to procedure a time out was called to verify the correct patient, procedure, equipment, support staff and site/side marked as required. Patient was prepped and draped in the usual sterile fashion.     Plan: She knows to wait 3 months between injections.  Continue work on dance movement psychotherapist.  Follow-up as needed.   "

## 2024-10-10 NOTE — Assessment & Plan Note (Signed)
 Lab Results  Component Value Date   NA 143 09/07/2024   K 4.1 09/07/2024   CO2 23 09/07/2024   GLUCOSE 86 09/07/2024   BUN 16 09/07/2024   CREATININE 1.06 (H) 09/07/2024   CALCIUM  10.8 (H) 09/07/2024   EGFR 56 (L) 09/07/2024   GFRNONAA >60 01/07/2020   Calcium  levels elevated. Multivitamin with calcium  may contribute. - Discontinued multivitamin with calcium . - Ordered labs to investigate cause of hypercalcemia.

## 2024-10-10 NOTE — Assessment & Plan Note (Signed)
 BP Readings from Last 3 Encounters:  10/10/24 133/74  06/06/24 129/72  05/02/24 (!) 145/74   Blood pressure controlled at 133/74 mmHg with valsartan  80 mg daily. - Continue valsartan  80 mg daily. - Encouraged heart-healthy, low-salt, low-fat diet. - Advised regular exercise, at least five days a week.

## 2024-10-10 NOTE — Progress Notes (Signed)
 "  Established Patient Office Visit  Subjective:  Patient ID: Hailey Young, female    DOB: 24-Jan-1951  Age: 73 y.o. MRN: [id number]  CC:  Chief Complaint  Patient presents with   Hypertension   Hyperlipidemia    HPI     Discussed the use of AI scribe software for clinical note transcription with the patient, who gave verbal consent to proceed.  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 who presents for a follow-up on her blood pressure management. She is accompanied by her daughter.  She is currently taking valsartan  80 mg daily for hypertension, having switched from hydrochlorothiazide  several months ago. Her current medication regimen also includes vitamin D  2000 units daily, atorvastatin  20 mg daily, a baby aspirin , and, multiple vitamins  iron once a day.  No chest pain or shortness of breath.  She had an appointment earlier today with an orthopedic doctor for knee osteoarthritis, where she received a cortisone injection and had fluid removed from her knees. She has not eaten all day due to her appointments.  In terms of lifestyle, she tries to stay hydrated by drinking plenty of water and avoids fried foods, although she occasionally eats fried chicken.     Assessment & Plan    Past Medical History:  Diagnosis Date   Essential hypertension 06/26/2020   Hyperlipidemia    Hyperlipidemia LDL goal <100 05/02/2024   Hypertension    Perianal condylomata    Unilateral primary osteoarthritis, left knee 05/05/2017   Unilateral primary osteoarthritis, right knee 02/02/2017    Past Surgical History:  Procedure Laterality Date   ablation of perianal warts       No family history on file.  Social History   Socioeconomic History   Marital status: Single    Spouse name: Not on file   Number of children: Not on file   Years of education: Not on file   Highest education level: Not on file  Occupational History   Not on file  Tobacco Use   Smoking status: Never   Smokeless tobacco: Never  Vaping Use   Vaping status: Never Used  Substance and Sexual Activity   Alcohol  use: No   Drug use: No   Sexual activity: Not Currently  Other Topics Concern   Not on file  Social History Narrative   Lives with son, Never married, 12 th grade education   Social Drivers of Health   Tobacco Use: Low Risk (10/10/2024)   Patient History    Smoking Tobacco Use: Never    Smokeless Tobacco Use: Never    Passive Exposure: Not on file  Financial Resource Strain: Low Risk (10/28/2022)   Overall Financial Resource Strain (CARDIA)    Difficulty of Paying Living Expenses: Not hard at all  Food Insecurity: No Food Insecurity (10/28/2022)   Hunger Vital Sign    Worried About Running Out of Food in the Last Year: Never true    Ran Out of Food in the Last Year: Never true  Transportation Needs: No Transportation Needs (10/28/2022)   PRAPARE - Administrator, Civil Service (Medical): No    Lack of Transportation (Non-Medical): No  Physical Activity: Inactive (10/28/2022)   Exercise Vital Sign    Days of Exercise per Week:  0 days    Minutes of Exercise per Session: 0 min  Stress: No Stress Concern Present (10/28/2022)   [employer] of Occupational Health - Occupational Stress Questionnaire    Feeling of Stress : Not at all  Social Connections: Moderately Isolated (10/28/2022)   Social Connection and Isolation Panel    Frequency of Communication with Friends and Family: Once a week    Frequency of Social Gatherings with Friends and Family: Three times a week    Attends Religious Services: 1 to 4 times per year    Active Member of Clubs or  Organizations: No    Attends Banker Meetings: Never    Marital Status: Never married  Intimate Partner Violence: Not At Risk (10/28/2022)   Humiliation, Afraid, Rape, and Kick questionnaire    Fear of Current or Ex-Partner: No    Emotionally Abused: No    Physically Abused: No    Sexually Abused: No  Depression (PHQ2-9): Low Risk (10/10/2024)   Depression (PHQ2-9)    PHQ-2 Score: 0  Alcohol  Screen: Not on file  Housing: Low Risk (10/28/2022)   Housing    Last Housing Risk Score: 0  Utilities: Not At Risk (10/28/2022)   [facility] Utilities    Threatened with loss of utilities: No  Health Literacy: Not on file    Outpatient Medications Prior to Visit  Medication Sig Dispense Refill   aspirin  EC 81 MG tablet Take 1 tablet (81 mg total) by mouth daily. 30 tablet 11   atorvastatin  (LIPITOR) 20 MG tablet Take 1 tablet (20 mg total) by mouth daily. 90 tablet 3   Cholecalciferol 50 MCG (2000 UT) TABS Take by mouth.     diclofenac  Sodium (VOLTAREN  ARTHRITIS PAIN) 1 % GEL Apply 4 g topically 4 (four) times daily. 100 g 1   Menthol , Topical Analgesic, (BIOFREEZE) 4 % GEL Apply 1 each topically every 6 (six) hours as needed. 1 Tube 0   valsartan  (DIOVAN ) 80 MG tablet Take 1 tablet (80 mg total) by mouth daily. 90 tablet 1   ferrous sulfate  325 (65 FE) MG tablet Take 1 tablet (325 mg total) by mouth 2 (two) times daily with a meal. 60 tablet 3   traMADol  (ULTRAM ) 50 MG tablet Take 1 tablet (50 mg total) by mouth every 6 (six) hours as needed. (Patient not taking: Reported on 10/10/2024) 30 tablet 0   No facility-administered medications prior to visit.    Allergies[1]  ROS Review of Systems  Constitutional:  Negative for appetite change, chills, fatigue and fever.  HENT:  Negative for congestion, postnasal drip, rhinorrhea and sneezing.   Respiratory:  Negative for cough, shortness of breath and wheezing.   Cardiovascular:  Negative for chest pain, palpitations and leg swelling.   Gastrointestinal:  Negative for abdominal pain, constipation, nausea and vomiting.  Genitourinary:  Negative for difficulty urinating, dysuria, flank pain and frequency.  Musculoskeletal:  Negative for arthralgias, back pain, joint swelling and myalgias.  Skin:  Negative for color change, pallor, rash and wound.  Neurological:  Negative for dizziness, facial asymmetry, weakness, numbness and headaches.  Psychiatric/Behavioral:  Negative for behavioral problems, confusion, self-injury and suicidal ideas.       Objective:    Physical Exam Vitals and nursing note reviewed.  Constitutional:      General: She is not in acute distress.    Appearance: Normal appearance. She is not ill-appearing, toxic-appearing or diaphoretic.  Eyes:     General: No scleral icterus.  Right eye: No discharge.        Left eye: No discharge.     Extraocular Movements: Extraocular movements intact.     Conjunctiva/sclera: Conjunctivae normal.  Cardiovascular:     Rate and Rhythm: Normal rate and regular rhythm.     Pulses: Normal pulses.     Heart sounds: Normal heart sounds. No murmur heard.    No friction rub. No gallop.  Pulmonary:     Effort: Pulmonary effort is normal. No respiratory distress.     Breath sounds: Normal breath sounds. No stridor. No wheezing, rhonchi or rales.  Chest:     Chest wall: No tenderness.  Abdominal:     General: There is no distension.     Palpations: Abdomen is soft.     Tenderness: There is no abdominal tenderness. There is no right CVA tenderness, left CVA tenderness or guarding.  Musculoskeletal:        General: No deformity or signs of injury.     Right lower leg: No edema.     Left lower leg: No edema.  Skin:    General: Skin is warm and dry.     Capillary Refill: Capillary refill takes less than 2 seconds.     Coloration: Skin is not jaundiced or pale.     Findings: No bruising, erythema or lesion.  Neurological:     Mental Status: She is alert and  oriented to person, place, and time.  Psychiatric:        Mood and Affect: Mood normal.        Behavior: Behavior normal.        Thought Content: Thought content normal.        Judgment: Judgment normal.     BP 133/74 (BP Location: Left Arm, Patient Position: Sitting, Cuff Size: Large)   Pulse 77   Wt 156 lb 6.4 oz (70.9 kg)   SpO2 97%   BMI 24.50 kg/m  Wt Readings from Last 3 Encounters:  10/10/24 156 lb 6.4 oz (70.9 kg)  06/06/24 155 lb (70.3 kg)  05/02/24 163 lb (73.9 kg)    Lab Results  Component Value Date   TSH 0.424 (L) 07/24/2021   Lab Results  Component Value Date   WBC 2.9 (L) 05/02/2024   HGB 11.8 05/02/2024   HCT 35.8 05/02/2024   MCV 101 (H) 05/02/2024   PLT 204 05/02/2024   Lab Results  Component Value Date   NA 143 09/07/2024   K 4.1 09/07/2024   CO2 23 09/07/2024   GLUCOSE 86 09/07/2024   BUN 16 09/07/2024   CREATININE 1.06 (H) 09/07/2024   BILITOT 0.3 05/02/2024   ALKPHOS 79 05/02/2024   AST 26 05/02/2024   ALT 22 05/02/2024   PROT 6.5 05/02/2024   ALBUMIN 4.0 05/02/2024   CALCIUM  10.8 (H) 09/07/2024   ANIONGAP 8 01/07/2020   EGFR 56 (L) 09/07/2024   Lab Results  Component Value Date   CHOL 169 09/07/2024   Lab Results  Component Value Date   HDL 69 09/07/2024   Lab Results  Component Value Date   LDLCALC 79 09/07/2024   Lab Results  Component Value Date   TRIG 121 09/07/2024   Lab Results  Component Value Date   CHOLHDL 2.4 09/07/2024   Lab Results  Component Value Date   HGBA1C 5.2 11/23/2016      Assessment & Plan:   Problem List Items Addressed This Visit       Cardiovascular and Mediastinum  Essential hypertension   BP Readings from Last 3 Encounters:  10/10/24 133/74  06/06/24 129/72  05/02/24 (!) 145/74   Blood pressure controlled at 133/74 mmHg with valsartan  80 mg daily. - Continue valsartan  80 mg daily. - Encouraged heart-healthy, low-salt, low-fat diet. - Advised regular exercise, at least five  days a week.       Relevant Orders   CBC     Other   Iron deficiency anemia   Lab Results  Component Value Date   IRON 23 (L) 01/01/2020   TIBC 275 01/01/2020   FERRITIN 88 01/06/2020   Taking ferrous sulfate  325 mg daily. Iron levels need monitoring. - Continue ferrous sulfate  325 mg daily. - Will check iron levels at next visit.        Relevant Medications   ferrous sulfate  325 (65 FE) MG tablet   Other Relevant Orders   CBC   Hypercalcemia - Primary   Lab Results  Component Value Date   NA 143 09/07/2024   K 4.1 09/07/2024   CO2 23 09/07/2024   GLUCOSE 86 09/07/2024   BUN 16 09/07/2024   CREATININE 1.06 (H) 09/07/2024   CALCIUM  10.8 (H) 09/07/2024   EGFR 56 (L) 09/07/2024   GFRNONAA >60 01/07/2020   Calcium  levels elevated. Multivitamin with calcium  may contribute. - Discontinued multivitamin with calcium . - Ordered labs to investigate cause of hypercalcemia.       Relevant Orders   Basic Metabolic Panel   PTH, Intact and Calcium    Health care maintenance   General health maintenance Mammogram not completed. Declined referral for mammogram. - Encouraged hydration with at least 64 ounces of water daily. - Advised to avoid NSAIDs like Aleve and ibuprofen  to prevent kidney problems.      Vitamin D  deficiency   Last vitamin D  Lab Results  Component Value Date   VD25OH 54.0 09/07/2024   On vitamin D  2000 units daily       Meds ordered this encounter  Medications   ferrous sulfate  325 (65 FE) MG tablet    Sig: Take 1 tablet (325 mg total) by mouth daily.    Dispense:  60 tablet    Refill:  3    Follow-up: Return in about 4 months (around 02/08/2025) for HTN, CPE.    Kayton Ripp R Denarius Sesler, FNP     [1] No Known Allergies  "

## 2024-10-10 NOTE — Assessment & Plan Note (Addendum)
 Lab Results  Component Value Date   IRON 23 (L) 01/01/2020   TIBC 275 01/01/2020   FERRITIN 88 01/06/2020   Taking ferrous sulfate  325 mg daily. Iron levels need monitoring. - Continue ferrous sulfate  325 mg daily. - Will check iron levels at next visit.

## 2024-10-10 NOTE — Patient Instructions (Signed)

## 2024-10-10 NOTE — Telephone Encounter (Signed)
 Pt stated she had an appt and forgot to ask for refill of tramadol . Please send to [facility] on [address]. Pt number is [phone].

## 2024-10-10 NOTE — Assessment & Plan Note (Signed)
 Last vitamin D  Lab Results  Component Value Date   VD25OH 54.0 09/07/2024   On vitamin D  2000 units daily

## 2024-10-11 LAB — CBC
Hematocrit: 37.3 % (ref 34.0–46.6)
Hemoglobin: 12 g/dL (ref 11.1–15.9)
MCH: 33.1 pg — ABNORMAL HIGH (ref 26.6–33.0)
MCHC: 32.2 g/dL (ref 31.5–35.7)
MCV: 103 fL — ABNORMAL HIGH (ref 79–97)
Platelets: 152 x10E3/uL (ref 150–450)
RBC: 3.62 x10E6/uL — ABNORMAL LOW (ref 3.77–5.28)
RDW: 12.6 % (ref 11.7–15.4)
WBC: 3.4 x10E3/uL (ref 3.4–10.8)

## 2024-10-11 LAB — BASIC METABOLIC PANEL WITH GFR
BUN/Creatinine Ratio: 22 (ref 12–28)
BUN: 24 mg/dL (ref 8–27)
CO2: 21 mmol/L (ref 20–29)
Calcium: 12.1 mg/dL — ABNORMAL HIGH (ref 8.7–10.3)
Chloride: 106 mmol/L (ref 96–106)
Creatinine, Ser: 1.1 mg/dL — ABNORMAL HIGH (ref 0.57–1.00)
Glucose: 93 mg/dL (ref 70–99)
Potassium: 4.1 mmol/L (ref 3.5–5.2)
Sodium: 143 mmol/L (ref 134–144)
eGFR: 53 mL/min/1.73 — ABNORMAL LOW

## 2024-10-11 LAB — PTH, INTACT AND CALCIUM: PTH: 105 pg/mL — ABNORMAL HIGH (ref 15–65)

## 2024-10-12 ENCOUNTER — Ambulatory Visit: Payer: Self-pay | Admitting: Nurse Practitioner

## 2024-10-12 DIAGNOSIS — E213 Hyperparathyroidism, unspecified: Secondary | ICD-10-CM

## 2024-10-12 DIAGNOSIS — D509 Iron deficiency anemia, unspecified: Secondary | ICD-10-CM

## 2024-10-15 ENCOUNTER — Other Ambulatory Visit: Payer: Self-pay | Admitting: Physician Assistant

## 2024-10-15 ENCOUNTER — Telehealth: Payer: Self-pay | Admitting: Physician Assistant

## 2024-10-15 MED ORDER — TRAMADOL HCL 50 MG PO TABS: 50.0000 mg | ORAL_TABLET | Freq: Four times a day (QID) | ORAL | 0 refills | Status: AC | PRN

## 2024-10-15 NOTE — Telephone Encounter (Signed)
 Pt called saying that Tory forgot to call in the Tramadol . Pharmacy is [facility] on [address]. Call back number is [phone]

## 2024-10-17 LAB — IRON,TIBC AND FERRITIN PANEL
Ferritin: 56 ng/mL (ref 15–150)
Iron Saturation: 42 % (ref 15–55)
Iron: 134 ug/dL (ref 27–139)
Total Iron Binding Capacity: 318 ug/dL (ref 250–450)
UIBC: 184 ug/dL (ref 118–369)

## 2024-10-17 LAB — VITAMIN B12: Vitamin B-12: 672 pg/mL (ref 232–1245)

## 2024-10-17 LAB — SPECIMEN STATUS REPORT

## 2024-11-29 ENCOUNTER — Other Ambulatory Visit (HOSPITAL_BASED_OUTPATIENT_CLINIC_OR_DEPARTMENT_OTHER)

## 2025-02-13 ENCOUNTER — Ambulatory Visit: Payer: Self-pay | Admitting: Nurse Practitioner
